# Patient Record
Sex: Male | Born: 1960 | Race: White | Hispanic: No | Marital: Single | Smoking: Current every day smoker
Health system: Southern US, Community
[De-identification: ages and names within clinical notes are randomized; demographics above are authoritative.]

## PROBLEM LIST (undated history)

## (undated) DIAGNOSIS — F1111 Opioid abuse, in remission: Secondary | ICD-10-CM

## (undated) DIAGNOSIS — G8929 Other chronic pain: Secondary | ICD-10-CM

## (undated) DIAGNOSIS — F1010 Alcohol abuse, uncomplicated: Secondary | ICD-10-CM

## (undated) DIAGNOSIS — B1920 Unspecified viral hepatitis C without hepatic coma: Secondary | ICD-10-CM

## (undated) DIAGNOSIS — I219 Acute myocardial infarction, unspecified: Secondary | ICD-10-CM

## (undated) DIAGNOSIS — A159 Respiratory tuberculosis unspecified: Secondary | ICD-10-CM

## (undated) DIAGNOSIS — F419 Anxiety disorder, unspecified: Secondary | ICD-10-CM

## (undated) HISTORY — PX: CORONARY STENT PLACEMENT: SHX1402

---

## 2006-02-14 ENCOUNTER — Emergency Department: Payer: Self-pay | Admitting: Emergency Medicine

## 2006-02-24 ENCOUNTER — Emergency Department: Payer: Self-pay | Admitting: Emergency Medicine

## 2006-02-25 ENCOUNTER — Emergency Department: Payer: Self-pay | Admitting: Emergency Medicine

## 2006-03-01 ENCOUNTER — Emergency Department: Payer: Self-pay | Admitting: Emergency Medicine

## 2006-03-13 ENCOUNTER — Emergency Department: Payer: Self-pay | Admitting: Emergency Medicine

## 2009-03-02 ENCOUNTER — Emergency Department: Payer: Self-pay | Admitting: Emergency Medicine

## 2009-07-21 ENCOUNTER — Emergency Department: Payer: Self-pay | Admitting: Emergency Medicine

## 2010-07-12 ENCOUNTER — Emergency Department (HOSPITAL_COMMUNITY): Admission: EM | Admit: 2010-07-12 | Discharge: 2010-07-13 | Payer: Self-pay | Admitting: Emergency Medicine

## 2010-10-15 ENCOUNTER — Other Ambulatory Visit: Payer: Self-pay | Admitting: Family Medicine

## 2011-03-12 LAB — COMPREHENSIVE METABOLIC PANEL
ALT: 93 U/L — ABNORMAL HIGH (ref 0–53)
BUN: 4 mg/dL — ABNORMAL LOW (ref 6–23)
CO2: 24 mEq/L (ref 19–32)
Calcium: 8.3 mg/dL — ABNORMAL LOW (ref 8.4–10.5)
Creatinine, Ser: 1.01 mg/dL (ref 0.4–1.5)
GFR calc non Af Amer: >60 mL/min (ref >60)
Glucose, Bld: 169 mg/dL — ABNORMAL HIGH (ref 70–99)
Sodium: 141 mEq/L (ref 135–145)
Total Protein: 6.8 g/dL (ref 6.0–8.3)

## 2011-03-12 LAB — CBC
HCT: 43.5 % (ref 39.0–52.0)
Hemoglobin: 15.4 g/dL (ref 13.0–17.0)
MCH: 33.8 pg (ref 26.0–34.0)
MCHC: 35.4 g/dL (ref 30.0–36.0)
MCV: 95.5 fL (ref 78.0–100.0)
RDW: 14.3 % (ref 11.5–15.5)

## 2011-03-12 LAB — DIFFERENTIAL
Eosinophils Absolute: 0.1 10*3/uL (ref 0.0–0.7)
Lymphs Abs: 2.1 10*3/uL (ref 0.7–4.0)
Monocytes Relative: 11 % (ref 3–12)
Neutro Abs: 2.8 10*3/uL (ref 1.7–7.7)
Neutrophils Relative %: 49 % (ref 43–77)

## 2011-03-12 LAB — URINALYSIS, ROUTINE W REFLEX MICROSCOPIC
Glucose, UA: 250 mg/dL — AB
Ketones, ur: NEGATIVE mg/dL
Nitrite: NEGATIVE
Specific Gravity, Urine: 1.012 (ref 1.005–1.030)
pH: 5.5 (ref 5.0–8.0)

## 2011-03-12 LAB — RAPID URINE DRUG SCREEN, HOSP PERFORMED
Benzodiazepines: NOT DETECTED
Cocaine: POSITIVE — AB
Opiates: NOT DETECTED

## 2011-04-30 ENCOUNTER — Emergency Department: Payer: Self-pay | Admitting: Emergency Medicine

## 2012-12-13 ENCOUNTER — Emergency Department: Payer: Self-pay | Admitting: Internal Medicine

## 2012-12-13 LAB — COMPREHENSIVE METABOLIC PANEL
Anion Gap: 7 (ref 7–16)
BUN: 7 mg/dL (ref 7–18)
Bilirubin,Total: 0.4 mg/dL (ref 0.2–1.0)
Chloride: 105 mmol/L (ref 98–107)
Co2: 25 mmol/L (ref 21–32)
Creatinine: 0.94 mg/dL (ref 0.60–1.30)
EGFR (African American): >60
Glucose: 80 mg/dL (ref 65–99)
Osmolality: 271 (ref 275–301)
Potassium: 4.1 mmol/L (ref 3.5–5.1)
SGOT(AST): 45 U/L — ABNORMAL HIGH (ref 15–37)
SGPT (ALT): 53 U/L (ref 12–78)
Total Protein: 8.3 g/dL — ABNORMAL HIGH (ref 6.4–8.2)

## 2012-12-13 LAB — DRUG SCREEN, URINE
Amphetamines, Ur Screen: NEGATIVE (ref ?–1000)
Benzodiazepine, Ur Scrn: POSITIVE (ref ?–200)
Cocaine Metabolite,Ur ~~LOC~~: NEGATIVE (ref ?–300)
MDMA (Ecstasy)Ur Screen: NEGATIVE (ref ?–500)
Methadone, Ur Screen: NEGATIVE (ref ?–300)

## 2012-12-13 LAB — CBC
MCHC: 34 g/dL (ref 32.0–36.0)
MCV: 94 fL (ref 80–100)
Platelet: 302 10*3/uL (ref 150–440)
RBC: 5.42 10*6/uL (ref 4.40–5.90)
RDW: 13.8 % (ref 11.5–14.5)
WBC: 9.5 10*3/uL (ref 3.8–10.6)

## 2012-12-13 LAB — SALICYLATE LEVEL: Salicylates, Serum: 2.9 mg/dL — ABNORMAL HIGH

## 2012-12-13 LAB — TSH: Thyroid Stimulating Horm: 1.27 u[IU]/mL

## 2012-12-13 LAB — ACETAMINOPHEN LEVEL: Acetaminophen: <2 ug/mL

## 2012-12-16 ENCOUNTER — Emergency Department: Payer: Self-pay | Admitting: Emergency Medicine

## 2012-12-16 LAB — URINALYSIS, COMPLETE
Bacteria: NONE SEEN
Bilirubin,UR: NEGATIVE
Glucose,UR: NEGATIVE mg/dL (ref 0–75)
Leukocyte Esterase: NEGATIVE
Nitrite: NEGATIVE
Squamous Epithelial: <1
WBC UR: <1 /HPF (ref 0–5)

## 2012-12-16 LAB — CBC
HCT: 48.2 % (ref 40.0–52.0)
HGB: 16.3 g/dL (ref 13.0–18.0)
MCHC: 33.8 g/dL (ref 32.0–36.0)
MCV: 94 fL (ref 80–100)
Platelet: 290 10*3/uL (ref 150–440)

## 2012-12-16 LAB — COMPREHENSIVE METABOLIC PANEL
Albumin: 4 g/dL (ref 3.4–5.0)
BUN: 5 mg/dL — ABNORMAL LOW (ref 7–18)
Bilirubin,Total: 0.3 mg/dL (ref 0.2–1.0)
Calcium, Total: 8.5 mg/dL (ref 8.5–10.1)
Chloride: 108 mmol/L — ABNORMAL HIGH (ref 98–107)
Creatinine: 1.21 mg/dL (ref 0.60–1.30)
EGFR (African American): >60
Glucose: 100 mg/dL — ABNORMAL HIGH (ref 65–99)
Potassium: 4 mmol/L (ref 3.5–5.1)
SGOT(AST): 53 U/L — ABNORMAL HIGH (ref 15–37)
SGPT (ALT): 52 U/L (ref 12–78)
Sodium: 142 mmol/L (ref 136–145)
Total Protein: 7.8 g/dL (ref 6.4–8.2)

## 2012-12-16 LAB — DRUG SCREEN, URINE
Amphetamines, Ur Screen: NEGATIVE (ref ?–1000)
Cocaine Metabolite,Ur ~~LOC~~: NEGATIVE (ref ?–300)
Methadone, Ur Screen: NEGATIVE (ref ?–300)
Opiate, Ur Screen: NEGATIVE (ref ?–300)
Phencyclidine (PCP) Ur S: NEGATIVE (ref ?–25)
Tricyclic, Ur Screen: NEGATIVE (ref ?–1000)

## 2013-11-30 ENCOUNTER — Encounter (HOSPITAL_COMMUNITY): Payer: Self-pay | Admitting: Emergency Medicine

## 2013-11-30 ENCOUNTER — Observation Stay (HOSPITAL_COMMUNITY)
Admission: EM | Admit: 2013-11-30 | Discharge: 2013-12-02 | Disposition: A | Discharge status: Home / Self Care | Payer: Medicaid - Out of State | Attending: Family Medicine | Admitting: Family Medicine

## 2013-11-30 ENCOUNTER — Emergency Department (HOSPITAL_COMMUNITY): Payer: Medicaid - Out of State

## 2013-11-30 ENCOUNTER — Other Ambulatory Visit: Payer: Self-pay

## 2013-11-30 DIAGNOSIS — R0602 Shortness of breath: Secondary | ICD-10-CM | POA: Insufficient documentation

## 2013-11-30 DIAGNOSIS — E781 Pure hyperglyceridemia: Secondary | ICD-10-CM | POA: Diagnosis present

## 2013-11-30 DIAGNOSIS — R072 Precordial pain: Principal | ICD-10-CM | POA: Insufficient documentation

## 2013-11-30 DIAGNOSIS — R768 Other specified abnormal immunological findings in serum: Secondary | ICD-10-CM | POA: Diagnosis present

## 2013-11-30 DIAGNOSIS — F1010 Alcohol abuse, uncomplicated: Secondary | ICD-10-CM

## 2013-11-30 DIAGNOSIS — Z9861 Coronary angioplasty status: Secondary | ICD-10-CM | POA: Diagnosis not present

## 2013-11-30 DIAGNOSIS — Z7902 Long term (current) use of antithrombotics/antiplatelets: Secondary | ICD-10-CM | POA: Diagnosis not present

## 2013-11-30 DIAGNOSIS — I2510 Atherosclerotic heart disease of native coronary artery without angina pectoris: Secondary | ICD-10-CM | POA: Insufficient documentation

## 2013-11-30 DIAGNOSIS — Z7982 Long term (current) use of aspirin: Secondary | ICD-10-CM | POA: Insufficient documentation

## 2013-11-30 DIAGNOSIS — Z8611 Personal history of tuberculosis: Secondary | ICD-10-CM | POA: Diagnosis not present

## 2013-11-30 DIAGNOSIS — R110 Nausea: Secondary | ICD-10-CM | POA: Diagnosis not present

## 2013-11-30 DIAGNOSIS — R079 Chest pain, unspecified: Secondary | ICD-10-CM | POA: Diagnosis present

## 2013-11-30 DIAGNOSIS — I252 Old myocardial infarction: Secondary | ICD-10-CM | POA: Insufficient documentation

## 2013-11-30 DIAGNOSIS — IMO0002 Reserved for concepts with insufficient information to code with codable children: Secondary | ICD-10-CM | POA: Insufficient documentation

## 2013-11-30 DIAGNOSIS — R7401 Elevation of levels of liver transaminase levels: Secondary | ICD-10-CM | POA: Diagnosis present

## 2013-11-30 DIAGNOSIS — Z8619 Personal history of other infectious and parasitic diseases: Secondary | ICD-10-CM | POA: Insufficient documentation

## 2013-11-30 DIAGNOSIS — F17200 Nicotine dependence, unspecified, uncomplicated: Secondary | ICD-10-CM | POA: Diagnosis not present

## 2013-11-30 DIAGNOSIS — F411 Generalized anxiety disorder: Secondary | ICD-10-CM | POA: Diagnosis not present

## 2013-11-30 DIAGNOSIS — K219 Gastro-esophageal reflux disease without esophagitis: Secondary | ICD-10-CM

## 2013-11-30 DIAGNOSIS — N182 Chronic kidney disease, stage 2 (mild): Secondary | ICD-10-CM

## 2013-11-30 HISTORY — DX: Acute myocardial infarction, unspecified: I21.9

## 2013-11-30 HISTORY — DX: Anxiety disorder, unspecified: F41.9

## 2013-11-30 HISTORY — DX: Respiratory tuberculosis unspecified: A15.9

## 2013-11-30 HISTORY — DX: Unspecified viral hepatitis C without hepatic coma: B19.20

## 2013-11-30 LAB — COMPREHENSIVE METABOLIC PANEL
ALT: 98 U/L — ABNORMAL HIGH (ref 0–53)
AST: 61 U/L — ABNORMAL HIGH (ref 0–37)
CO2: 24 mEq/L (ref 19–32)
Chloride: 102 mEq/L (ref 96–112)
Creatinine, Ser: 0.83 mg/dL (ref 0.50–1.35)
GFR calc Af Amer: >90 mL/min (ref >90)
GFR calc non Af Amer: >90 mL/min (ref >90)
Glucose, Bld: 108 mg/dL — ABNORMAL HIGH (ref 70–99)
Sodium: 141 mEq/L (ref 135–145)
Total Bilirubin: 0.3 mg/dL (ref 0.3–1.2)

## 2013-11-30 LAB — POCT I-STAT TROPONIN I: Troponin i, poc: 0.01 ng/mL (ref 0.00–0.08)

## 2013-11-30 LAB — URINALYSIS, ROUTINE W REFLEX MICROSCOPIC
Bilirubin Urine: NEGATIVE
Hgb urine dipstick: NEGATIVE
Protein, ur: NEGATIVE mg/dL
Urobilinogen, UA: 0.2 mg/dL (ref 0.0–1.0)

## 2013-11-30 LAB — CBC
Hemoglobin: 18.6 g/dL — ABNORMAL HIGH (ref 13.0–17.0)
MCV: 89.2 fL (ref 78.0–100.0)
Platelets: 220 10*3/uL (ref 150–400)
RBC: 5.66 MIL/uL (ref 4.22–5.81)
WBC: 7.7 10*3/uL (ref 4.0–10.5)

## 2013-11-30 LAB — RAPID URINE DRUG SCREEN, HOSP PERFORMED
Barbiturates: NOT DETECTED
Tetrahydrocannabinol: NOT DETECTED

## 2013-11-30 LAB — PRO B NATRIURETIC PEPTIDE: Pro B Natriuretic peptide (BNP): 459 pg/mL — ABNORMAL HIGH (ref 0–125)

## 2013-11-30 LAB — TROPONIN I: Troponin I: <0.3 ng/mL (ref <0.30)

## 2013-11-30 MED ORDER — NITROGLYCERIN 0.4 MG SL SUBL: 0.4000 mg | SUBLINGUAL_TABLET | SUBLINGUAL | Status: DC | PRN

## 2013-11-30 MED ORDER — ASPIRIN EC 81 MG PO TBEC
81.0000 mg | DELAYED_RELEASE_TABLET | Freq: Every day | ORAL | Status: DC
  Administered 2013-12-01 – 2013-12-02 (×2): 81 mg via ORAL
  Filled 2013-11-30 (×3): qty 1

## 2013-11-30 MED ORDER — CLOPIDOGREL BISULFATE 75 MG PO TABS
75.0000 mg | ORAL_TABLET | Freq: Every day | ORAL | Status: DC
  Administered 2013-12-01 – 2013-12-02 (×2): 75 mg via ORAL
  Filled 2013-11-30 (×2): qty 1

## 2013-11-30 MED ORDER — SODIUM CHLORIDE 0.9 % IV SOLN: 250.0000 mL | INTRAVENOUS | Status: DC | PRN

## 2013-11-30 MED ORDER — ASPIRIN 300 MG RE SUPP
300.0000 mg | RECTAL | Status: AC
  Filled 2013-11-30: qty 1

## 2013-11-30 MED ORDER — ASPIRIN 81 MG PO CHEW: 324.0000 mg | CHEWABLE_TABLET | ORAL | Status: AC

## 2013-11-30 MED ORDER — ADULT MULTIVITAMIN W/MINERALS CH
1.0000 | ORAL_TABLET | Freq: Every day | ORAL | Status: DC
  Administered 2013-12-01 – 2013-12-02 (×2): 1 via ORAL
  Filled 2013-11-30 (×2): qty 1

## 2013-11-30 MED ORDER — PANTOPRAZOLE SODIUM 40 MG PO TBEC
40.0000 mg | DELAYED_RELEASE_TABLET | Freq: Every day | ORAL | Status: DC
  Administered 2013-11-30 – 2013-12-02 (×3): 40 mg via ORAL
  Filled 2013-11-30 (×3): qty 1

## 2013-11-30 MED ORDER — VITAMIN B-1 100 MG PO TABS
100.0000 mg | ORAL_TABLET | Freq: Every day | ORAL | Status: DC
  Administered 2013-12-01 – 2013-12-02 (×2): 100 mg via ORAL
  Filled 2013-11-30 (×2): qty 1

## 2013-11-30 MED ORDER — LORAZEPAM 2 MG/ML IJ SOLN
0.0000 mg | Freq: Four times a day (QID) | INTRAMUSCULAR | Status: DC
  Administered 2013-12-01: 1 mg via INTRAVENOUS
  Administered 2013-12-02: 2 mg via INTRAVENOUS
  Filled 2013-11-30 (×2): qty 1

## 2013-11-30 MED ORDER — KETOROLAC TROMETHAMINE 30 MG/ML IJ SOLN
30.0000 mg | Freq: Once | INTRAMUSCULAR | Status: AC
  Administered 2013-11-30: 30 mg via INTRAVENOUS
  Filled 2013-11-30: qty 1

## 2013-11-30 MED ORDER — SODIUM CHLORIDE 0.9 % IJ SOLN: 3.0000 mL | INTRAMUSCULAR | Status: DC | PRN

## 2013-11-30 MED ORDER — FOLIC ACID 1 MG PO TABS
1.0000 mg | ORAL_TABLET | Freq: Every day | ORAL | Status: DC
  Administered 2013-12-01 – 2013-12-02 (×2): 1 mg via ORAL
  Filled 2013-11-30 (×2): qty 1

## 2013-11-30 MED ORDER — THIAMINE HCL 100 MG/ML IJ SOLN
100.0000 mg | Freq: Every day | INTRAMUSCULAR | Status: DC
  Filled 2013-11-30: qty 1

## 2013-11-30 MED ORDER — LORAZEPAM 2 MG/ML IJ SOLN: 1.0000 mg | Freq: Four times a day (QID) | INTRAMUSCULAR | Status: DC | PRN

## 2013-11-30 MED ORDER — ACETAMINOPHEN 325 MG PO TABS
650.0000 mg | ORAL_TABLET | ORAL | Status: DC | PRN
  Administered 2013-12-01: 650 mg via ORAL
  Filled 2013-11-30: qty 2

## 2013-11-30 MED ORDER — LORAZEPAM 1 MG PO TABS
1.0000 mg | ORAL_TABLET | Freq: Four times a day (QID) | ORAL | Status: DC | PRN
  Administered 2013-12-01 (×2): 1 mg via ORAL
  Filled 2013-11-30 (×2): qty 1

## 2013-11-30 MED ORDER — SODIUM CHLORIDE 0.9 % IJ SOLN
3.0000 mL | Freq: Two times a day (BID) | INTRAMUSCULAR | Status: DC
  Administered 2013-11-30 – 2013-12-02 (×3): 3 mL via INTRAVENOUS

## 2013-11-30 MED ORDER — LORAZEPAM 2 MG/ML IJ SOLN: 0.0000 mg | Freq: Two times a day (BID) | INTRAMUSCULAR | Status: DC

## 2013-11-30 MED ORDER — ONDANSETRON HCL 4 MG/2ML IJ SOLN: 4.0000 mg | Freq: Four times a day (QID) | INTRAMUSCULAR | Status: DC | PRN

## 2013-11-30 MED ORDER — HEPARIN SODIUM (PORCINE) 5000 UNIT/ML IJ SOLN
5000.0000 [IU] | Freq: Three times a day (TID) | INTRAMUSCULAR | Status: DC
  Administered 2013-11-30 – 2013-12-01 (×4): 5000 [IU] via SUBCUTANEOUS
  Filled 2013-11-30 (×8): qty 1

## 2013-11-30 NOTE — ED Notes (Signed)
[facility] presents with a 52 yo male [place] with CP.  Pt apperaed to be in respiratory distress/ pt couldn't speak in complete sentences when given NTG and relieved pain and breathing difficulty completely.  12 lead unremarkable; ST at 104/ no elevations.  Hx of Hep C and TB (treated/last CXR clear).

## 2013-11-30 NOTE — ED Provider Notes (Signed)
I saw and evaluated the patient, reviewed the resident's note and I agree with the findings and plan.  EKG Interpretation    Date/Time:  Saturday November 30 2013 15:00:57 EST Ventricular Rate:  112 PR Interval:  139 QRS Duration: 92 QT Interval:  342 QTC Calculation: 467 R Axis:   -9 Text Interpretation:  Sinus tachycardia Probable left atrial enlargement Inferior infarct, age indeterminate No old tracing to compare Confirmed by Thaddeus Evitts  MD-J, Chiann Goffredo ([id number]) on 11/30/2013 3:13:50 PM            Pt presents with complaints of left sided chest pain.  PT has history of CAD with stent pplacement in [place].  He has been drinking alcohol today and started developing left sided chest pain.  Will check cardiac markers, CXR. Monitor closely.  NO acute STEMI noted on initial ECG.   Initial labs unremarkable however with his history cannot exclude recurrent ACS.  Will admit for further evaluation  Celene Kras, MD 11/30/13 1902

## 2013-11-30 NOTE — ED Notes (Addendum)
Pt had taken himself off the monitor, will not allow me to place the monitor, continuous pulse oximetry or blood pressure cuff back on him; pt stated "I feel good now. I"m a lot better than I was earlier when I got in here. I can go right?"; informed pt that I would ask the nurse then pt asked if he could have something to eat; asked RN and was informed that pt would need to wait until all his results came back; informed pt and pt stated "ok, that's fine. Can I have a blanket?" gave pt a warm blanket; pt resting at this time

## 2013-11-30 NOTE — H&P (Signed)
Family Medicine Teaching [facility] Admission History and Physical Service Pager: [phone]  Patient name: Darrell [name] Medical record number: [id number] Date of birth: 1961/12/26 Age: 52 y.o. Gender: male  Primary Care Provider: No primary provider on file. Consultants: none Code Status: full  Chief Complaint: chest pain  Assessment and Plan: Darrell [name] is a 52 y.o. male presenting with chest pain. PMH is significant for MI s/p stent placement and Hep C.  # Chest Pain: patient with current resolution of chest pain following nitroglycerin. He gives a pretty typical chest pain history with substernal pressure, dyspnea, and relief with nitro. EKG with no signs of ischemia. Troponins negative thus far. Patient with reported stent placement and stress test within the past 2 months. -will admit to tele, attending Dr Gwendolyn Grant -cycle troponins -repeat EKG in the am -risk stratify A1c, lipid panel, TSH -will request records from most recent hospitalizations in [facility] medical [facility] and [address] in [place], [place] -nitro prn for chest pain  -aspirin 324 mg on admission, 81 mg daily afterwards -continue plavix for recent stent placement -will likely need to consult cardiology in the morning to get set up with cardiology in [place]  # Alcohol abuse: patient reports significant drinking today and most days. -CIWA protocol -observe for withdrawal symptoms  # Elevated AST/ALT: consider this is related to Hep C in addition to drinking. -will trend on CMET -will check hep C antibody  # Reflux: will give trial of protonix in the hospital  FEN/GI: heart healthy, protonix Prophylaxis: Heparin Sq  Disposition: admit to tele, discharge pending chest pain rule out  History of Present Illness: Darrell [name] is a 52 y.o. male presenting with an episode of chest pain earlier today. Patient notes recent history of stent placement on 10/18 in [place] and negative stress test in the middle  of November in [place], [place]. Patient states pain today occurred while he was walking downtown and drinking alcohol. Notes the pain started when he was walking up a hill. Noted discomfort (aching and pressure) in the center of his chest. Noted dyspnea as well. No radiation or diaphoresis. Notes he took a SL nitro and this slowly helped the pain go away. Per ED report the pain lasted for about 20 minutes. He has not had pain since that time. He notes smoking one PPD. Drinking alcohol frequently. Previously using cocaine. Additionally complains of reflux symptoms intermittently with burning in epigastrium and lower chest.  In the ED the patient had 2 negative iStat troponins, an EKG without ischemic changes, negative UDS, pro-BNP 459, hgb 18.6, AST 61, ALT 98.  Review Of Systems: Per HPI with the following additions: none Otherwise 12 point review of systems was performed and was unremarkable.  Patient Active Problem List   Diagnosis Date Noted  . Chest pain 11/30/2013   Past Medical History: Past Medical History  Diagnosis Date  . Hepatitis C   . Tuberculosis   . Myocardial infarction   . Anxiety    Past Surgical History: Past Surgical History  Procedure Laterality Date  . Coronary stent placement     Social History: History  Substance Use Topics  . Smoking status: Current Every Day Smoker  . Smokeless tobacco: Never Used  . Alcohol Use: Yes     Comment: pt had pint whiskey, 2 24 oz beers, 1 can of beer, more whiskey today   Additional social history: Patient endorses history of marijuana and cocaine use. Nurse notes it was reported to her that he has  snorted percocet in the past.  Please also refer to relevant sections of EMR.  Family History: History reviewed. No pertinent family history. Allergies and Medications: No Known Allergies No current facility-administered medications on file prior to encounter.   No current outpatient prescriptions on file prior to encounter.     Objective: BP 107/76  Pulse 85  Temp(Src) 98.4 F (36.9 C) (Oral)  Resp 18  Ht 5\' 11"  (1.803 m)  Wt 178 lb 6.4 oz (80.922 kg)  BMI 24.89 kg/m2  SpO2 98% Exam: General: NAD, laying comfortably on bed HEENT: NCAT, MMM, missing posterior molars, PERRL Cardiovascular: rrr, no murmurs appreciated Respiratory: CTAB, no wheezes or crackles noted Abdomen: soft, NT, ND Extremities: no swelling, 2+ DP pulses Skin: no lesions Neuro: alert, no focal deficits  Labs and Imaging:  Results for orders placed during the hospital encounter of 11/30/13 (from the past 24 hour(s))  CBC     Status: Abnormal   Collection Time    11/30/13  3:15 PM      Result Value Range   WBC 7.7  4.0 - 10.5 K/uL   RBC 5.66  4.22 - 5.81 MIL/uL   Hemoglobin 18.6 (*) 13.0 - 17.0 g/dL   HCT 16.1  09.6 - 04.5 %   MCV 89.2  78.0 - 100.0 fL   MCH 32.9  26.0 - 34.0 pg   MCHC 36.8 (*) 30.0 - 36.0 g/dL   RDW 40.9  81.1 - 91.4 %   Platelets 220  150 - 400 K/uL  COMPREHENSIVE METABOLIC PANEL     Status: Abnormal   Collection Time    11/30/13  3:15 PM      Result Value Range   Sodium 141  135 - 145 mEq/L   Potassium 3.9  3.5 - 5.1 mEq/L   Chloride 102  96 - 112 mEq/L   CO2 24  19 - 32 mEq/L   Glucose, Bld 108 (*) 70 - 99 mg/dL   BUN 5 (*) 6 - 23 mg/dL   Creatinine, Ser 7.82  0.50 - 1.35 mg/dL   Calcium 8.9  8.4 - 95.6 mg/dL   Total Protein 8.4 (*) 6.0 - 8.3 g/dL   Albumin 4.5  3.5 - 5.2 g/dL   AST 61 (*) 0 - 37 U/L   ALT 98 (*) 0 - 53 U/L   Alkaline Phosphatase 67  39 - 117 U/L   Total Bilirubin 0.3  0.3 - 1.2 mg/dL   GFR calc non Af Amer >90  >90 mL/min   GFR calc Af Amer >90  >90 mL/min  PRO B NATRIURETIC PEPTIDE     Status: Abnormal   Collection Time    11/30/13  3:34 PM      Result Value Range   Pro B Natriuretic peptide (BNP) 459.0 (*) 0 - 125 pg/mL  POCT I-STAT TROPONIN I     Status: None   Collection Time    11/30/13  3:34 PM      Result Value Range   Troponin i, poc 0.01  0.00 - 0.08 ng/mL    Comment 3           URINALYSIS, ROUTINE W REFLEX MICROSCOPIC     Status: None   Collection Time    11/30/13  5:26 PM      Result Value Range   Color, Urine YELLOW  YELLOW   APPearance CLEAR  CLEAR   Specific Gravity, Urine 1.005  1.005 - 1.030   pH 6.0  5.0 - 8.0   Glucose, UA NEGATIVE  NEGATIVE mg/dL   Hgb urine dipstick NEGATIVE  NEGATIVE   Bilirubin Urine NEGATIVE  NEGATIVE   Ketones, ur NEGATIVE  NEGATIVE mg/dL   Protein, ur NEGATIVE  NEGATIVE mg/dL   Urobilinogen, UA 0.2  0.0 - 1.0 mg/dL   Nitrite NEGATIVE  NEGATIVE   Leukocytes, UA NEGATIVE  NEGATIVE  URINE RAPID DRUG SCREEN (HOSP PERFORMED)     Status: None   Collection Time    11/30/13  5:26 PM      Result Value Range   Opiates NONE DETECTED  NONE DETECTED   Cocaine NONE DETECTED  NONE DETECTED   Benzodiazepines NONE DETECTED  NONE DETECTED   Amphetamines NONE DETECTED  NONE DETECTED   Tetrahydrocannabinol NONE DETECTED  NONE DETECTED   Barbiturates NONE DETECTED  NONE DETECTED  POCT I-STAT TROPONIN I     Status: None   Collection Time    11/30/13  5:50 PM      Result Value Range   Troponin i, poc 0.02  0.00 - 0.08 ng/mL   Comment 3           TROPONIN I     Status: None   Collection Time    11/30/13  9:15 PM      Result Value Range   Troponin I <0.30  <0.30 ng/mL   Dg Chest 2 View  11/30/2013   CLINICAL DATA:  Back pain and shortness of Breath.  EXAM: CHEST  2 VIEW  COMPARISON:  None.  FINDINGS: The cardiac silhouette, mediastinal and hilar contours are within normal limits. There is mild peribronchial thickening in the perihilar regions likely due to smoking. No definite infiltrates, edema or effusions. The bony thorax is intact.  IMPRESSION: Bronchitic type lung changes, likely related to smoking. No infiltrates or effusions.   Electronically Signed   By: Loralie Champagne M.D.   On: 11/30/2013 16:38     Glori Luis, MD 11/30/2013, 10:23 PM PGY-2, [facility] FPTS Intern pager: [phone],  text pages welcome

## 2013-11-30 NOTE — ED Provider Notes (Signed)
CSN: [id number]     Arrival date & time 11/30/13  1453 History   First MD Initiated Contact with Patient 11/30/13 1515     Chief Complaint  Patient presents with  . Chest Pain   (Consider location/radiation/quality/duration/timing/severity/associated sxs/prior Treatment) HPI Comments: 52 year old male past medical history of CAD status [facility] stent placement and hepatitis C comes in chief complaint chest pain. Patient states he was drinking on Street 30 minutes prior to arrival when developed substernal 10 out of 10 chest pain. He has associated shortness breath. Called EMS. Nitroglycerin and ASA given. Complete relief of chest pain with nitroglycerin.  Patient is a 52 y.o. male presenting with chest pain. The history is provided by the patient.  Chest Pain Pain location:  Substernal area Pain quality: pressure   Pain radiates to:  Does not radiate Pain radiates to the back: no   Pain severity:  Severe Onset quality:  Sudden Duration:  20 minutes Timing:  Constant Progression:  Resolved Context comment:  Drinking on street Relieved by:  Nitroglycerin Worsened by:  Nothing tried Associated symptoms: nausea and shortness of breath   Associated symptoms: no cough, no fatigue and no headache     Past Medical History  Diagnosis Date  . Hepatitis C   . Tuberculosis    Past Surgical History  Procedure Laterality Date  . Coronary stent placement     History reviewed. No pertinent family history. History  Substance Use Topics  . Smoking status: Current Every Day Smoker  . Smokeless tobacco: Never Used  . Alcohol Use: Yes     Comment: pt had pint whiskey, 2 24 oz beers, 1 can of beer, more whiskey today    Review of Systems  Constitutional: Negative for fatigue.  Respiratory: Positive for shortness of breath. Negative for cough.   Cardiovascular: Positive for chest pain.  Gastrointestinal: Positive for nausea.  Genitourinary: Negative for dysuria.  Skin: Negative for rash.   Neurological: Negative for headaches.  All other systems reviewed and are negative.    Allergies  Review of patient's allergies indicates no known allergies.  Home Medications   Current Outpatient Rx  Name  Route  Sig  Dispense  Refill  . aspirin EC 81 MG tablet   Oral   Take 81 mg by mouth daily.         . clopidogrel (PLAVIX) 75 MG tablet   Oral   Take 75 mg by mouth daily with breakfast.          BP 112/90  Pulse 91  Temp(Src) 98.2 F (36.8 C) (Oral)  Resp 14  SpO2 98% Physical Exam  Nursing note and vitals reviewed. Constitutional: He is oriented to person, place, and time. He appears well-developed and well-nourished.  HENT:  Head: Normocephalic and atraumatic.  Eyes: EOM are normal. Pupils are equal, round, and reactive to light.  Neck: Normal range of motion.  Cardiovascular: Regular rhythm and intact distal pulses.   Slightly tachycardic (104) but pt very agitated and yelling at nurses and myself on exam. Remainder of cardiac exam difficult as pt yelling  "yeehaw" when attempting to listen to chest despite asking not to do this. When able to auscultate no murmur rubs or gallops appreciated    Pulmonary/Chest: Effort normal and breath sounds normal. No respiratory distress. He has no wheezes. He exhibits no tenderness.  Abdominal: Soft. He exhibits no distension. There is no tenderness. There is no rebound and no guarding.  Musculoskeletal: Normal range of motion. He  exhibits no edema.  Neurological: He is alert and oriented to person, place, and time. No cranial nerve deficit. He exhibits normal muscle tone. Coordination normal.  Skin: Skin is warm and dry. No rash noted.  Psychiatric: His affect is angry. He is agitated. He expresses no homicidal and no suicidal ideation.  Pt yelling at staff and uncooperative with exam despite asking pt to not yell at staff. Pt upset with IV start saying he can hit his veins easier    ED Course  Procedures (including  critical care time) Labs Review Labs Reviewed  CBC - Abnormal; Notable for the following:    Hemoglobin 18.6 (*)    MCHC 36.8 (*)    All other components within normal limits  COMPREHENSIVE METABOLIC PANEL - Abnormal; Notable for the following:    Glucose, Bld 108 (*)    BUN 5 (*)    Total Protein 8.4 (*)    AST 61 (*)    ALT 98 (*)    All other components within normal limits  PRO B NATRIURETIC PEPTIDE - Abnormal; Notable for the following:    Pro B Natriuretic peptide (BNP) 459.0 (*)    All other components within normal limits  URINALYSIS, ROUTINE W REFLEX MICROSCOPIC  URINE RAPID DRUG SCREEN (HOSP PERFORMED)  POCT I-STAT TROPONIN I  POCT I-STAT TROPONIN I   Imaging Review Dg Chest 2 View  11/30/2013   CLINICAL DATA:  Back pain and shortness of Breath.  EXAM: CHEST  2 VIEW  COMPARISON:  None.  FINDINGS: The cardiac silhouette, mediastinal and hilar contours are within normal limits. There is mild peribronchial thickening in the perihilar regions likely due to smoking. No definite infiltrates, edema or effusions. The bony thorax is intact.  IMPRESSION: Bronchitic type lung changes, likely related to smoking. No infiltrates or effusions.   Electronically Signed   By: Loralie Champagne M.D.   On: 11/30/2013 16:38    EKG Interpretation    Date/Time:  Saturday November 30 2013 15:00:57 EST Ventricular Rate:  112 PR Interval:  139 QRS Duration: 92 QT Interval:  342 QTC Calculation: 467 R Axis:   -9 Text Interpretation:  Sinus tachycardia Probable left atrial enlargement Inferior infarct, age indeterminate No old tracing to compare Confirmed by KNAPP  MD-J, JON ([id number]) on 11/30/2013 3:13:50 PM            MDM   1. Chest pain     On arrival afebrile vital signs stable. Patient denies any chest pain on arrival. States nitroglycerin with EMS alleviated all pain. Patient received ASA 325 with EMS. Patient also reported history of stent placement in [place] several years ago.  Concern is patient for cardiac related chest pain given high risk history. EKG findings as noted above however no overt signs of cardiac ischemia. Cardiac enzymes negative x2. Chest x-ray within normal limits. Chest pain not consistent with aortic pathology. Chest x-ray showed no widening mediastinum. Doubt aortic pathology. CBC CMP unremarkable. UDS was also obtained, patient has a history of drug use to rule out cocaine or stimulants related tachycardia and chest pain. UDS showed no positive results. Because patient has had history of CAD and is high-risk will be admitted to family medicine service for continued cardiac evaluation. Remained stable in the ED with no further acute issues or chest pain until time of transfer to floor.  Bridgett Larsson, MD 11/30/13 [phone]

## 2013-12-01 DIAGNOSIS — F17200 Nicotine dependence, unspecified, uncomplicated: Secondary | ICD-10-CM | POA: Diagnosis present

## 2013-12-01 DIAGNOSIS — N182 Chronic kidney disease, stage 2 (mild): Secondary | ICD-10-CM

## 2013-12-01 DIAGNOSIS — F1010 Alcohol abuse, uncomplicated: Secondary | ICD-10-CM

## 2013-12-01 DIAGNOSIS — E781 Pure hyperglyceridemia: Secondary | ICD-10-CM | POA: Diagnosis present

## 2013-12-01 DIAGNOSIS — R7401 Elevation of levels of liver transaminase levels: Secondary | ICD-10-CM | POA: Diagnosis present

## 2013-12-01 LAB — LIPID PANEL
Cholesterol: 138 mg/dL (ref 0–200)
HDL: 26 mg/dL — ABNORMAL LOW (ref >39)
LDL Cholesterol: 64 mg/dL (ref 0–99)
Triglycerides: 239 mg/dL — ABNORMAL HIGH (ref <150)
VLDL: 48 mg/dL — ABNORMAL HIGH (ref 0–40)

## 2013-12-01 LAB — BASIC METABOLIC PANEL
CO2: 26 mEq/L (ref 19–32)
Chloride: 104 mEq/L (ref 96–112)
Creatinine, Ser: 1.1 mg/dL (ref 0.50–1.35)
GFR calc Af Amer: 87 mL/min — ABNORMAL LOW (ref >90)
GFR calc non Af Amer: 75 mL/min — ABNORMAL LOW (ref >90)
Glucose, Bld: 77 mg/dL (ref 70–99)
Potassium: 4 mEq/L (ref 3.5–5.1)
Sodium: 144 mEq/L (ref 135–145)

## 2013-12-01 LAB — CBC
Hemoglobin: 16.9 g/dL (ref 13.0–17.0)
MCH: 32.2 pg (ref 26.0–34.0)
MCV: 90.3 fL (ref 78.0–100.0)
Platelets: 227 10*3/uL (ref 150–400)
RBC: 5.25 MIL/uL (ref 4.22–5.81)
RDW: 13.1 % (ref 11.5–15.5)
WBC: 8.3 10*3/uL (ref 4.0–10.5)

## 2013-12-01 LAB — HEMOGLOBIN A1C
Hgb A1c MFr Bld: 5.6 % (ref <5.7)
Mean Plasma Glucose: 114 mg/dL (ref <117)

## 2013-12-01 LAB — TROPONIN I: Troponin I: <0.3 ng/mL (ref <0.30)

## 2013-12-01 MED ORDER — NICOTINE 21 MG/24HR TD PT24
21.0000 mg | MEDICATED_PATCH | Freq: Every day | TRANSDERMAL | Status: DC
Start: 2013-12-01 — End: 2013-12-02
  Administered 2013-12-01 – 2013-12-02 (×2): 21 mg via TRANSDERMAL
  Filled 2013-12-01 (×2): qty 1

## 2013-12-01 NOTE — Progress Notes (Signed)
Family Medicine Teaching Service Daily Progress Note Intern Pager: [phone]  Patient name: Darrell [name] Medical record number: [id number] Date of birth: 12-01-1961 Age: 52 y.o. Gender: male  Primary Care Provider: No primary provider on file. Consultants: none Code Status: full  Pt Overview and Major Events to Date:   Assessment and Plan: Darrell [name] is a 52 y.o. male presenting with chest pain. PMH is significant for MI s/p stent placement and Hep C.  # Chest Pain: Resolved. EKG with no signs of ischemia. Troponins negative thus far. Patient with reported stent placement and stress test within the past 2 months. Significant h/o CAD, s/p cath.  -cont Tele  - Med records from most recent hospitalizations in [facility] and [address] in [place], [place] have been requested -nitro prn for chest pain  - ASA 81 mg daily  -continue plavix for recent stent placement   # Alcohol abuse: Last ETOH use in the pm on 11/30/13. Anxious about withdrawal on 12/01/13. -CIWA protocol  -observe for withdrawal symptoms   # Elevated AST/ALT: consider this is related to Hep C in addition to drinking.  - hep C antibody pending  # Reflux: will give trial of protonix in the hospital   FEN/GI: heart healthy, protonix  Prophylaxis: Heparin Sq   Disposition: Pending withdrawal and clinical improvment   Subjective: CP resolved today. Feelign anxious which pt associates w/ his ETOH use and withdrawal.   Objective: Temp:  [98.2 F (36.8 C)-98.6 F (37 C)] 98.6 F (37 C) (12/07 0600) Pulse Rate:  [76-115] 89 (12/07 1032) Resp:  [14-23] 16 (12/07 0600) BP: (95-117)/(40-94) 117/88 mmHg (12/07 1032) SpO2:  [88 %-98 %] 98 % (12/07 0600) Weight:  [178 lb 6.4 oz (80.922 kg)] 178 lb 6.4 oz (80.922 kg) (12/06 2100) Physical Exam: General: NAD, WNWD, lying in bed Cardiovascular: RRR no m/r/g Respiratory: CTAB, nml effort Abdomen: non-distended, nonttp Extremities: no cyanosis, 2+  puleses  Laboratory:   Results for orders placed during the hospital encounter of 11/30/13 (from the past 24 hour(s))  CBC     Status: Abnormal   Collection Time    11/30/13  3:15 PM      Result Value Range   WBC 7.7  4.0 - 10.5 K/uL   RBC 5.66  4.22 - 5.81 MIL/uL   Hemoglobin 18.6 (*) 13.0 - 17.0 g/dL   HCT 14.7  82.9 - 56.2 %   MCV 89.2  78.0 - 100.0 fL   MCH 32.9  26.0 - 34.0 pg   MCHC 36.8 (*) 30.0 - 36.0 g/dL   RDW 13.0  86.5 - 78.4 %   Platelets 220  150 - 400 K/uL  COMPREHENSIVE METABOLIC PANEL     Status: Abnormal   Collection Time    11/30/13  3:15 PM      Result Value Range   Sodium 141  135 - 145 mEq/L   Potassium 3.9  3.5 - 5.1 mEq/L   Chloride 102  96 - 112 mEq/L   CO2 24  19 - 32 mEq/L   Glucose, Bld 108 (*) 70 - 99 mg/dL   BUN 5 (*) 6 - 23 mg/dL   Creatinine, Ser 6.96  0.50 - 1.35 mg/dL   Calcium 8.9  8.4 - 29.5 mg/dL   Total Protein 8.4 (*) 6.0 - 8.3 g/dL   Albumin 4.5  3.5 - 5.2 g/dL   AST 61 (*) 0 - 37 U/L   ALT 98 (*) 0 - 53 U/L  Alkaline Phosphatase 67  39 - 117 U/L   Total Bilirubin 0.3  0.3 - 1.2 mg/dL   GFR calc non Af Amer >90  >90 mL/min   GFR calc Af Amer >90  >90 mL/min  PRO B NATRIURETIC PEPTIDE     Status: Abnormal   Collection Time    11/30/13  3:34 PM      Result Value Range   Pro B Natriuretic peptide (BNP) 459.0 (*) 0 - 125 pg/mL  POCT I-STAT TROPONIN I     Status: None   Collection Time    11/30/13  3:34 PM      Result Value Range   Troponin i, poc 0.01  0.00 - 0.08 ng/mL   Comment 3           URINALYSIS, ROUTINE W REFLEX MICROSCOPIC     Status: None   Collection Time    11/30/13  5:26 PM      Result Value Range   Color, Urine YELLOW  YELLOW   APPearance CLEAR  CLEAR   Specific Gravity, Urine 1.005  1.005 - 1.030   pH 6.0  5.0 - 8.0   Glucose, UA NEGATIVE  NEGATIVE mg/dL   Hgb urine dipstick NEGATIVE  NEGATIVE   Bilirubin Urine NEGATIVE  NEGATIVE   Ketones, ur NEGATIVE  NEGATIVE mg/dL   Protein, ur NEGATIVE  NEGATIVE  mg/dL   Urobilinogen, UA 0.2  0.0 - 1.0 mg/dL   Nitrite NEGATIVE  NEGATIVE   Leukocytes, UA NEGATIVE  NEGATIVE  URINE RAPID DRUG SCREEN (HOSP PERFORMED)     Status: None   Collection Time    11/30/13  5:26 PM      Result Value Range   Opiates NONE DETECTED  NONE DETECTED   Cocaine NONE DETECTED  NONE DETECTED   Benzodiazepines NONE DETECTED  NONE DETECTED   Amphetamines NONE DETECTED  NONE DETECTED   Tetrahydrocannabinol NONE DETECTED  NONE DETECTED   Barbiturates NONE DETECTED  NONE DETECTED  POCT I-STAT TROPONIN I     Status: None   Collection Time    11/30/13  5:50 PM      Result Value Range   Troponin i, poc 0.02  0.00 - 0.08 ng/mL   Comment 3           TROPONIN I     Status: None   Collection Time    11/30/13  9:15 PM      Result Value Range   Troponin I <0.30  <0.30 ng/mL  TROPONIN I     Status: None   Collection Time    12/01/13 12:40 AM      Result Value Range   Troponin I <0.30  <0.30 ng/mL  CBC     Status: None   Collection Time    12/01/13 12:40 AM      Result Value Range   WBC 8.3  4.0 - 10.5 K/uL   RBC 5.25  4.22 - 5.81 MIL/uL   Hemoglobin 16.9  13.0 - 17.0 g/dL   HCT 81.1  91.4 - 78.2 %   MCV 90.3  78.0 - 100.0 fL   MCH 32.2  26.0 - 34.0 pg   MCHC 35.7  30.0 - 36.0 g/dL   RDW 95.6  21.3 - 08.6 %   Platelets 227  150 - 400 K/uL  BASIC METABOLIC PANEL     Status: Abnormal   Collection Time    12/01/13 12:40 AM      Result Value  Range   Sodium 144  135 - 145 mEq/L   Potassium 4.0  3.5 - 5.1 mEq/L   Chloride 104  96 - 112 mEq/L   CO2 26  19 - 32 mEq/L   Glucose, Bld 77  70 - 99 mg/dL   BUN 5 (*) 6 - 23 mg/dL   Creatinine, Ser 1.61  0.50 - 1.35 mg/dL   Calcium 8.8  8.4 - 09.6 mg/dL   GFR calc non Af Amer 75 (*) >90 mL/min   GFR calc Af Amer 87 (*) >90 mL/min  LIPID PANEL     Status: Abnormal   Collection Time    12/01/13 12:40 AM      Result Value Range   Cholesterol 138  0 - 200 mg/dL   Triglycerides 045 (*) <150 mg/dL   HDL 26 (*) >40 mg/dL    Total CHOL/HDL Ratio 5.3     VLDL 48 (*) 0 - 40 mg/dL   LDL Cholesterol 64  0 - 99 mg/dL  TROPONIN I     Status: None   Collection Time    12/01/13  8:05 AM      Result Value Range   Troponin I <0.30  <0.30 ng/mL     Ozella Rocks, MD 12/01/2013, 10:37 AM PGY-3, [facility] FPTS Intern pager: [phone], text pages welcome

## 2013-12-01 NOTE — Progress Notes (Signed)
FMTS Attending Daily Note:  Jeff Johan Creveling MD  [phone] pager  Family Practice pager:  [phone] I have seen and examined this patient and have reviewed their chart. I have discussed this patient with the resident. I agree with the resident's findings, assessment and care plan.  Additionally:  See separate admit note for details.   Kate Larock H Presly Steinruck, MD 12/01/2013 12:04 PM    

## 2013-12-01 NOTE — Progress Notes (Signed)
Patient's heart rate goes up to 120-140's while ambulating to the bathroom.  Earnest Conroy RN

## 2013-12-01 NOTE — H&P (Addendum)
FMTS Attending Admission Note: Renold Don MD Personal pager:  [phone] FPTS Service Pager:  [phone]  I  have seen and examined this patient, reviewed their chart. I have discussed this patient with the resident. I agree with the resident's findings, assessment and care plan.  Additionally: - No evidence of ACS currently.  - Evidently patient with what sounds to be NSTEMI and reported stent placement. [place] records as [place].  Does have evidence of prior inferior infarct on EKG - Supposed to be on ASA/Plavix.  Could not get this medications filled due to finances.  Has paper script with him but no actual medications. - Now going through EtOH w/d.  Scheduling ativan and already on CIWA.   - Feel very hesitant about sending out a gentleman with reported recent MI, possible stent with ASA/Plavix who cannot acquire his medications now going through EtOH w/d and this being the weekend. - Consult case management for help with insurance and medications.  Social work for EtOH abuse -- states he has quit before and started again after losing his job in October. - Nicotine patch.  - Agree with cardiology consult to establish care here in [place].    Tobey Grim, MD 12/01/2013 11:56 AM

## 2013-12-01 NOTE — Progress Notes (Signed)
Utilization Review Completed.Darrell [name] T12/06/2013  

## 2013-12-02 DIAGNOSIS — R894 Abnormal immunological findings in specimens from other organs, systems and tissues: Secondary | ICD-10-CM

## 2013-12-02 DIAGNOSIS — F17200 Nicotine dependence, unspecified, uncomplicated: Secondary | ICD-10-CM

## 2013-12-02 DIAGNOSIS — R768 Other specified abnormal immunological findings in serum: Secondary | ICD-10-CM | POA: Diagnosis present

## 2013-12-02 LAB — BASIC METABOLIC PANEL
BUN: 8 mg/dL (ref 6–23)
Calcium: 8.7 mg/dL (ref 8.4–10.5)
Chloride: 103 mEq/L (ref 96–112)
Creatinine, Ser: 0.87 mg/dL (ref 0.50–1.35)
GFR calc Af Amer: >90 mL/min (ref >90)
GFR calc non Af Amer: >90 mL/min (ref >90)
Glucose, Bld: 86 mg/dL (ref 70–99)
Potassium: 3.5 mEq/L (ref 3.5–5.1)

## 2013-12-02 MED ORDER — CLOPIDOGREL BISULFATE 75 MG PO TABS: 75.0000 mg | ORAL_TABLET | Freq: Every day | ORAL | Status: DC

## 2013-12-02 MED ORDER — NITROGLYCERIN 0.4 MG SL SUBL: 0.4000 mg | SUBLINGUAL_TABLET | SUBLINGUAL | Status: DC | PRN

## 2013-12-02 MED ORDER — LORAZEPAM 2 MG PO TABS: 2.0000 mg | ORAL_TABLET | ORAL | Status: DC

## 2013-12-02 MED ORDER — THIAMINE HCL 100 MG PO TABS: 100.0000 mg | ORAL_TABLET | Freq: Every day | ORAL | Status: DC

## 2013-12-02 MED ORDER — FOLIC ACID 1 MG PO TABS: 1.0000 mg | ORAL_TABLET | Freq: Every day | ORAL | Status: DC

## 2013-12-02 NOTE — Progress Notes (Signed)
CSW (Clinical Child psychotherapist) spoke with supervisor about transportation options for pt. CSW relayed this to pt but expressed that CSW would need to just touch base with pt daughter before arranging any transport. Pt provided CSW with daughter [name]'s phone number [phone]. CSW left voicemail for pt daughter. CSW explained we would wait for pt daughter to return call. Pt said that if CSW could please [place] would figure something out from there. CSW provided pt with bus pass along with shelter list incase pt is unable to reach his daughter and get to [place]. Pt thanked CSW. CSW to touch base with pt again if [name] returns phone call before pt discharges.  Darrell [name], LCSWA [phone]

## 2013-12-02 NOTE — Care Management Note (Signed)
    Page 1 of 1   12/02/2013     1:47:57 PM   CARE MANAGEMENT NOTE 12/02/2013  Patient:  Darrell [name], Darrell [name]   Account Number:  [id number]  Date Initiated:  12/02/2013  Documentation initiated by:  GRAVES-BIGELOW,Ahaana Rochette  Subjective/Objective Assessment:   Pt admitted for cp. Plan for d/c today. Pt states he is trying to get to [facility] to daughter's house.     Action/Plan:   CM did provide pt plavix card. CM also relayed to pt that Karin Golden has medication for 15.00 with the vic card. Pt states his daughter probably has card. CM did ask if pt has a PCP. Pt did not. CM called FP and they would make f/u apt.   Anticipated DC Date:  12/02/2013   Anticipated DC Plan:  HOME/SELF CARE      DC Planning Services  CM consult      Choice offered to / List presented to:             Status of service:  Completed, signed off Medicare Important Message given?   (If response is "NO", the following Medicare IM given date fields will be blank) Date Medicare IM given:   Date Additional Medicare IM given:    Discharge Disposition:  HOME/SELF CARE  Per UR Regulation:  Reviewed for med. necessity/level of care/duration of stay  If discussed at Long Length of Stay Meetings, dates discussed:    Comments:

## 2013-12-02 NOTE — Progress Notes (Signed)
Clinical Social Work Department BRIEF PSYCHOSOCIAL ASSESSMENT 12/02/2013  Patient:  Darrell [name], Darrell [name]     Account Number:  [id number]     Admit date:  11/30/2013  Clinical Social Worker:  Harless Nakayama  Date/Time:  12/02/2013 12:45 PM  Referred by:  Physician  Date Referred:  12/02/2013 Referred for  Substance Abuse  Homelessness  Transportation assistance   Other Referral:   Interview type:  Patient Other interview type:    PSYCHOSOCIAL DATA Living Status:  FAMILY Admitted from facility:   Level of care:   Primary support name:   Primary support relationship to patient:   Degree of support available:   Pt has limited support system. Pt is going to be staying with his daughter at dc.    CURRENT CONCERNS Current Concerns  Other - See comment   Other Concerns:    SOCIAL WORK ASSESSMENT / PLAN CSW received consult for homless isssues as well as substance abuse. CSW informed by pt nurse that pt reports not being homeless and that pt will need transportation assistance. CSW spoke with pt who informed CSW he will be going to stay with his daughter in [place]. CSW asked if pt had any concerns with his living situation pt denies any concerns. CSW spoke with pt about susbtance abuse. Pt reports being sober for "years now" and attending AA meetings but having a relapse when he was traveling. Pt reports to CSW that he is already aware of resources and AA meeting locations in [place] as he as attending them before. Pt denies wanting any further resources. CSW inquired as to how pt was going to get to [place]. Pt stated he was unsure as his daughter has small children at home and cannot pick him up. Pt states there are no other supports available. Pt informed CSW that he has no funds available and "was actually almost arrested the other day for asking people of rmoney". Pt is aware of the bus route and is comfortable using that form of transportation to get to [place] where his  daughter will be able to pick him up. CSW to speak with supervisor to approve giving pt funds to take [employer] bus.   Assessment/plan status:  Psychosocial Support/Ongoing Assessment of Needs Other assessment/ plan:   Information/referral to community resources:   Homeless and Substance Abuse resources denied.    PATIENT'S/FAMILY'S RESPONSE TO PLAN OF CARE: Pt is happy with plan to dc to his daugter's if he can find transportation there.       Obadiah Dennard, LCSWA [phone]

## 2013-12-04 NOTE — Discharge Summary (Signed)
Family Medicine Teaching Service  Discharge Note : Attending Jeff Subhan Hoopes MD Pager [phone] Inpatient Team Pager:  [phone]  I have reviewed this patient and the patient's chart and have discussed discharge planning with the resident at the time of discharge. I agree with the discharge plan as above.    

## 2013-12-04 NOTE — Discharge Summary (Signed)
Family Medicine Teaching [facility] Discharge Summary  Patient name: Darrell [name] Medical record number: [id number] Date of birth: 11-30-1961 Age: 52 y.o. Gender: male Date of Admission: 11/30/2013  Date of Discharge: 12/02/13 Admitting Physician: Tobey Grim, MD  Primary Care Provider: No primary provider on file. Consultants: Cardiology  Indication for Hospitalization: Chest pain, rule out MI  Discharge Diagnoses/Problem List:  Chest Pain - Ruled out MI Alcohol Abuse Transaminitis, Chronic  Disposition: Home  Discharge Condition: Stable  Discharge Exam: General: sitting up in bed comfortably, conversational, NAD  HEENT: NCAT, PERRL, EOMI, MMM Cardiovascular: RRR, no murmurs appreciated, brisk cap refill Respiratory: CTAB. Normal work of breathing Abdomen: soft, NTND, +active BS  Extremities: non-tender, no edema, +2 peripheral pulses  Skin: warm, dry, no rash Neuro: awake, alert, no focal deficits  Brief Hospital Course: [name] is a 52 y.o. male presenting with chest pain. PMH is significant for CAD with MI s/p stent placement (09/2013) and Hep C.  # Chest Pain - Ruled out MI Recent significant h/o MI with stent placement in 09/2013 while in [facility]), started on Plavix at that time, and then presented again with CP 10/2013 in [place] ([facility] [place]). Of note, patient is currently homeless, but plans to remain in [place]. He presented with typical anginal pain described as chest aching / pressure associated with dyspnea, while walking up a hill. Noted he was drinking alcohol at the time. Relief from Nitro SL x 1, arrived in ED and pain had resolved within 20 min. Initial work-up EKG x2 (negative for ischemia), troponins (negative x 3), remained on telemetry w/o acute events, no recurrent CP. Risk stratify labs (HgbA1c 5.6, TSH 1.137, Lipid panel with elevated TG 239, low HDL 26). Resumed ASA and Plavix therapy with stent.  Arranged outpatient follow-up with Cardiology, requested out of state hospital records.   # Alcohol abuse Reports chronic h/o alcohol abuse, however has made effort to decrease within past year. Currently reports drinking 4-8 beers daily. Last ETOH use in the pm on 11/30/13. Anxious about withdrawal on 12/01/13. Placed on CIWA protocol and tolerated Ativan taper well without signs of withdrawal. Recommended prolonged taper, and provided a small number of Ativan 2mg  for patient to complete taper at home.   # Transaminitis Elevated AST (61) and ALT (98), noted to be less than previous h/o of elevated LFTs in 2011. Suspected related to EtOH abuse and chronic Hep C, and repeat HCV Ab (reactive). Additionally tested for HIV (non-reactive).  Issues for Follow Up:  1. PCP - Discussed PCP options and provided patient with follow-up contacts. If unable to locate PCP in [facility] around [place]. Advised to contact [facility] for further assistance. 2. Cardiology Follow-up - Outpatient Cardiology arranged for 12/31. Requested outside state hospital records. Currently pending receipt. Advised that this would be scanned in once received. 3. Alcohol Abuse - Patient seems interested in medical detox, discussed potential resources available. Need to continue to follow-up  Significant Procedures: None  Significant Labs and Imaging:   Recent Labs Lab 11/30/13 1515 12/01/13 0040  WBC 7.7 8.3  HGB 18.6* 16.9  HCT 50.5 47.4  PLT 220 227    Recent Labs Lab 11/30/13 1515 12/01/13 0040 12/02/13 0510  NA 141 144 137  K 3.9 4.0 3.5  CL 102 104 103  CO2 24 26 24   GLUCOSE 108* 77 86  BUN 5* 5* 8  CREATININE 0.83 1.10 0.87  CALCIUM 8.9 8.8 8.7  ALKPHOS 67  --   --  AST 61*  --   --   ALT 98*  --   --   ALBUMIN 4.5  --   --     Recent Results (from the past 2160 hour(s))  CBC     Status: Abnormal   Collection Time    11/30/13  3:15 PM      Result Value Range   WBC 7.7  4.0 - 10.5 K/uL   RBC  5.66  4.22 - 5.81 MIL/uL   Hemoglobin 18.6 (*) 13.0 - 17.0 g/dL   HCT 09.8  11.9 - 14.7 %   MCV 89.2  78.0 - 100.0 fL   MCH 32.9  26.0 - 34.0 pg   MCHC 36.8 (*) 30.0 - 36.0 g/dL   RDW 82.9  56.2 - 13.0 %   Platelets 220  150 - 400 K/uL  COMPREHENSIVE METABOLIC PANEL     Status: Abnormal   Collection Time    11/30/13  3:15 PM      Result Value Range   Sodium 141  135 - 145 mEq/L   Potassium 3.9  3.5 - 5.1 mEq/L   Chloride 102  96 - 112 mEq/L   CO2 24  19 - 32 mEq/L   Glucose, Bld 108 (*) 70 - 99 mg/dL   BUN 5 (*) 6 - 23 mg/dL   Creatinine, Ser 8.65  0.50 - 1.35 mg/dL   Calcium 8.9  8.4 - 78.4 mg/dL   Total Protein 8.4 (*) 6.0 - 8.3 g/dL   Albumin 4.5  3.5 - 5.2 g/dL   AST 61 (*) 0 - 37 U/L   ALT 98 (*) 0 - 53 U/L   Alkaline Phosphatase 67  39 - 117 U/L   Total Bilirubin 0.3  0.3 - 1.2 mg/dL   GFR calc non Af Amer >90  >90 mL/min   GFR calc Af Amer >90  >90 mL/min   Comment: (NOTE)     The eGFR has been calculated using the CKD EPI equation.     This calculation has not been validated in all clinical situations.     eGFR's persistently <90 mL/min signify possible Chronic Kidney     Disease.  PRO B NATRIURETIC PEPTIDE     Status: Abnormal   Collection Time    11/30/13  3:34 PM      Result Value Range   Pro B Natriuretic peptide (BNP) 459.0 (*) 0 - 125 pg/mL  POCT I-STAT TROPONIN I     Status: None   Collection Time    11/30/13  3:34 PM      Result Value Range   Troponin i, poc 0.01  0.00 - 0.08 ng/mL   Comment 3            Comment: Due to the release kinetics of cTnI,     a negative result within the first hours     of the onset of symptoms does not rule out     myocardial infarction with certainty.     If myocardial infarction is still suspected,     repeat the test at appropriate intervals.  URINALYSIS, ROUTINE W REFLEX MICROSCOPIC     Status: None   Collection Time    11/30/13  5:26 PM      Result Value Range   Color, Urine YELLOW  YELLOW   APPearance CLEAR   CLEAR   Specific Gravity, Urine 1.005  1.005 - 1.030   pH 6.0  5.0 - 8.0   Glucose,  UA NEGATIVE  NEGATIVE mg/dL   Hgb urine dipstick NEGATIVE  NEGATIVE   Bilirubin Urine NEGATIVE  NEGATIVE   Ketones, ur NEGATIVE  NEGATIVE mg/dL   Protein, ur NEGATIVE  NEGATIVE mg/dL   Urobilinogen, UA 0.2  0.0 - 1.0 mg/dL   Nitrite NEGATIVE  NEGATIVE   Leukocytes, UA NEGATIVE  NEGATIVE   Comment: MICROSCOPIC NOT DONE ON URINES WITH NEGATIVE PROTEIN, BLOOD, LEUKOCYTES, NITRITE, OR GLUCOSE <1000 mg/dL.  URINE RAPID DRUG SCREEN (HOSP PERFORMED)     Status: None   Collection Time    11/30/13  5:26 PM      Result Value Range   Opiates NONE DETECTED  NONE DETECTED   Cocaine NONE DETECTED  NONE DETECTED   Benzodiazepines NONE DETECTED  NONE DETECTED   Amphetamines NONE DETECTED  NONE DETECTED   Tetrahydrocannabinol NONE DETECTED  NONE DETECTED   Barbiturates NONE DETECTED  NONE DETECTED   Comment:            DRUG SCREEN FOR MEDICAL PURPOSES     ONLY.  IF CONFIRMATION IS NEEDED     FOR ANY PURPOSE, NOTIFY LAB     WITHIN 5 DAYS.                LOWEST DETECTABLE LIMITS     FOR URINE DRUG SCREEN     Drug Class       Cutoff (ng/mL)     Amphetamine      1000     Barbiturate      200     Benzodiazepine   200     Tricyclics       300     Opiates          300     Cocaine          300     THC              50  POCT I-STAT TROPONIN I     Status: None   Collection Time    11/30/13  5:50 PM      Result Value Range   Troponin i, poc 0.02  0.00 - 0.08 ng/mL   Comment 3            Comment: Due to the release kinetics of cTnI,     a negative result within the first hours     of the onset of symptoms does not rule out     myocardial infarction with certainty.     If myocardial infarction is still suspected,     repeat the test at appropriate intervals.  TROPONIN I     Status: None   Collection Time    11/30/13  9:15 PM      Result Value Range   Troponin I <0.30  <0.30 ng/mL   Comment:            Due to  the release kinetics of cTnI,     a negative result within the first hours     of the onset of symptoms does not rule out     myocardial infarction with certainty.     If myocardial infarction is still suspected,     repeat the test at appropriate intervals.  TSH     Status: None   Collection Time    11/30/13  9:15 PM      Result Value Range   TSH 1.137  0.350 - 4.500 uIU/mL   Comment: Performed at  [facility]  HEMOGLOBIN A1C     Status: None   Collection Time    11/30/13  9:15 PM      Result Value Range   Hemoglobin A1C 5.6  <5.7 %   Comment: (NOTE)                                                                               According to the ADA Clinical Practice Recommendations for 2011, when     HbA1c is used as a screening test:      >=6.5%   Diagnostic of Diabetes Mellitus               (if abnormal result is confirmed)     5.7-6.4%   Increased risk of developing Diabetes Mellitus     References:Diagnosis and Classification of Diabetes Mellitus,Diabetes     Care,2011,34(Suppl 1):S62-S69 and Standards of Medical Care in             Diabetes - 2011,Diabetes Care,2011,34 (Suppl 1):S11-S61.   Mean Plasma Glucose 114  <117 mg/dL   Comment: Performed at [facility]  TROPONIN I     Status: None   Collection Time    12/01/13 12:40 AM      Result Value Range   Troponin I <0.30  <0.30 ng/mL   Comment:            Due to the release kinetics of cTnI,     a negative result within the first hours     of the onset of symptoms does not rule out     myocardial infarction with certainty.     If myocardial infarction is still suspected,     repeat the test at appropriate intervals.  CBC     Status: None   Collection Time    12/01/13 12:40 AM      Result Value Range   WBC 8.3  4.0 - 10.5 K/uL   RBC 5.25  4.22 - 5.81 MIL/uL   Hemoglobin 16.9  13.0 - 17.0 g/dL   HCT 40.9  81.1 - 91.4 %   MCV 90.3  78.0 - 100.0 fL   MCH 32.2  26.0 - 34.0 pg   MCHC 35.7  30.0 - 36.0  g/dL   RDW 78.2  95.6 - 21.3 %   Platelets 227  150 - 400 K/uL  BASIC METABOLIC PANEL     Status: Abnormal   Collection Time    12/01/13 12:40 AM      Result Value Range   Sodium 144  135 - 145 mEq/L   Potassium 4.0  3.5 - 5.1 mEq/L   Chloride 104  96 - 112 mEq/L   CO2 26  19 - 32 mEq/L   Glucose, Bld 77  70 - 99 mg/dL   BUN 5 (*) 6 - 23 mg/dL   Creatinine, Ser 0.86  0.50 - 1.35 mg/dL   Calcium 8.8  8.4 - 57.8 mg/dL   GFR calc non Af Amer 75 (*) >90 mL/min   GFR calc Af Amer 87 (*) >90 mL/min   Comment: (NOTE)     The eGFR has been calculated using the CKD EPI equation.  This calculation has not been validated in all clinical situations.     eGFR's persistently <90 mL/min signify possible Chronic Kidney     Disease.  LIPID PANEL     Status: Abnormal   Collection Time    12/01/13 12:40 AM      Result Value Range   Cholesterol 138  0 - 200 mg/dL   Triglycerides 161 (*) <150 mg/dL   HDL 26 (*) >09 mg/dL   Total CHOL/HDL Ratio 5.3     VLDL 48 (*) 0 - 40 mg/dL   LDL Cholesterol 64  0 - 99 mg/dL   Comment:            Total Cholesterol/HDL:CHD Risk     Coronary Heart Disease Risk Table                         Men   Women      1/2 Average Risk   3.4   3.3      Average Risk       5.0   4.4      2 X Average Risk   9.6   7.1      3 X Average Risk  23.4   11.0                Use the calculated Patient Ratio     above and the CHD Risk Table     to determine the patient's CHD Risk.                ATP III CLASSIFICATION (LDL):      <100     mg/dL   Optimal      604-540  mg/dL   Near or Above                        Optimal      130-159  mg/dL   Borderline      981-191  mg/dL   High      >478     mg/dL   Very High  HEPATITIS C ANTIBODY     Status: Abnormal   Collection Time    12/01/13 12:40 AM      Result Value Range   HCV Ab Reactive (*) NEGATIVE   Comment: (NOTE)                                                                               This test is for screening  purposes only.  Reactive results should be     confirmed by an alternative method.  Suggest HCV Qualitative, PCR,     test code 29562.  Specimens will be stable for reflex testing up to 3     days after collection.     Performed at [facility]  TROPONIN I     Status: None   Collection Time    12/01/13  8:05 AM      Result Value Range   Troponin I <0.30  <0.30 ng/mL   Comment:            Due to the release kinetics of cTnI,  a negative result within the first hours     of the onset of symptoms does not rule out     myocardial infarction with certainty.     If myocardial infarction is still suspected,     repeat the test at appropriate intervals.  BASIC METABOLIC PANEL     Status: None   Collection Time    12/02/13  5:10 AM      Result Value Range   Sodium 137  135 - 145 mEq/L   Potassium 3.5  3.5 - 5.1 mEq/L   Chloride 103  96 - 112 mEq/L   CO2 24  19 - 32 mEq/L   Glucose, Bld 86  70 - 99 mg/dL   BUN 8  6 - 23 mg/dL   Creatinine, Ser 1.30  0.50 - 1.35 mg/dL   Calcium 8.7  8.4 - 86.5 mg/dL   GFR calc non Af Amer >90  >90 mL/min   GFR calc Af Amer >90  >90 mL/min   Comment: (NOTE)     The eGFR has been calculated using the CKD EPI equation.     This calculation has not been validated in all clinical situations.     eGFR's persistently <90 mL/min signify possible Chronic Kidney     Disease.  HIV ANTIBODY (ROUTINE TESTING)     Status: None   Collection Time    12/02/13  5:10 AM      Result Value Range   HIV NON REACTIVE  NON REACTIVE   Comment: Performed at [facility]   11/30/13 Chest Xray IMPRESSION:  Bronchitic type lung changes, likely related to smoking. No  infiltrates or effusions  Results/Tests Pending at Time of Discharge: None  Discharge Medications:    Medication List         aspirin EC 81 MG tablet  Take 81 mg by mouth daily.     clopidogrel 75 MG tablet  Commonly known as:  PLAVIX  Take 1 tablet (75 mg total) by mouth daily with  breakfast.     folic acid 1 MG tablet  Commonly known as:  FOLVITE  Take 1 tablet (1 mg total) by mouth daily.     LORazepam 2 MG tablet  Commonly known as:  ATIVAN  Take 1 tablet (2 mg total) by mouth as directed. Take 1 tab every 8 hrs (today 12/8). Take 1 tab every 12 hrs (12/9). Take 1 tab (12/10).     nitroGLYCERIN 0.4 MG SL tablet  Commonly known as:  NITROSTAT  Place 1 tablet (0.4 mg total) under the tongue every 5 (five) minutes as needed for chest pain.     thiamine 100 MG tablet  Take 1 tablet (100 mg total) by mouth daily.        Discharge Instructions: Please refer to Patient Instructions section of EMR for full details.  Patient was counseled important signs and symptoms that should prompt return to medical care, changes in medications, dietary instructions, activity restrictions, and follow up appointments.   Follow-Up Appointments:     Follow-up Information   Schedule an appointment as soon as possible for a visit with [facility].      Follow up with [facility]. (If you are unable to schedule any alternative primary physician in [facility])    Contact information:   [address] [place] [address] [phone]      Follow up with Tobias Alexander, Rexene Edison, MD On 12/25/2013. (  at 10:00am with Dr. Delton See)    Specialty:  Cardiology   Contact information:   [address] [place] [address] [phone]       Saralyn Pilar, DO 12/04/2013, 6:32 AM PGY-1, [facility] Family [facility]

## 2013-12-07 ENCOUNTER — Encounter (HOSPITAL_COMMUNITY): Payer: Self-pay | Admitting: Emergency Medicine

## 2013-12-07 ENCOUNTER — Emergency Department (HOSPITAL_COMMUNITY): Payer: Medicaid - Out of State

## 2013-12-07 ENCOUNTER — Emergency Department (HOSPITAL_COMMUNITY)
Admission: EM | Admit: 2013-12-07 | Discharge: 2013-12-07 | Disposition: A | Discharge status: Home / Self Care | Payer: Medicaid - Out of State | Attending: Emergency Medicine | Admitting: Emergency Medicine

## 2013-12-07 DIAGNOSIS — F10229 Alcohol dependence with intoxication, unspecified: Secondary | ICD-10-CM | POA: Insufficient documentation

## 2013-12-07 DIAGNOSIS — Z79899 Other long term (current) drug therapy: Secondary | ICD-10-CM | POA: Insufficient documentation

## 2013-12-07 DIAGNOSIS — F17200 Nicotine dependence, unspecified, uncomplicated: Secondary | ICD-10-CM | POA: Insufficient documentation

## 2013-12-07 DIAGNOSIS — F1010 Alcohol abuse, uncomplicated: Secondary | ICD-10-CM

## 2013-12-07 DIAGNOSIS — Z91199 Patient's noncompliance with other medical treatment and regimen due to unspecified reason: Secondary | ICD-10-CM | POA: Insufficient documentation

## 2013-12-07 DIAGNOSIS — Z8611 Personal history of tuberculosis: Secondary | ICD-10-CM | POA: Insufficient documentation

## 2013-12-07 DIAGNOSIS — Z7982 Long term (current) use of aspirin: Secondary | ICD-10-CM | POA: Insufficient documentation

## 2013-12-07 DIAGNOSIS — Z7902 Long term (current) use of antithrombotics/antiplatelets: Secondary | ICD-10-CM | POA: Insufficient documentation

## 2013-12-07 DIAGNOSIS — F411 Generalized anxiety disorder: Secondary | ICD-10-CM | POA: Insufficient documentation

## 2013-12-07 DIAGNOSIS — R51 Headache: Secondary | ICD-10-CM | POA: Insufficient documentation

## 2013-12-07 DIAGNOSIS — Z8619 Personal history of other infectious and parasitic diseases: Secondary | ICD-10-CM | POA: Insufficient documentation

## 2013-12-07 DIAGNOSIS — Z9119 Patient's noncompliance with other medical treatment and regimen: Secondary | ICD-10-CM | POA: Insufficient documentation

## 2013-12-07 DIAGNOSIS — I252 Old myocardial infarction: Secondary | ICD-10-CM | POA: Insufficient documentation

## 2013-12-07 DIAGNOSIS — Z9861 Coronary angioplasty status: Secondary | ICD-10-CM | POA: Insufficient documentation

## 2013-12-07 DIAGNOSIS — R079 Chest pain, unspecified: Secondary | ICD-10-CM | POA: Insufficient documentation

## 2013-12-07 HISTORY — DX: Opioid abuse, in remission: F11.11

## 2013-12-07 HISTORY — DX: Other chronic pain: G89.29

## 2013-12-07 HISTORY — DX: Alcohol abuse, uncomplicated: F10.10

## 2013-12-07 LAB — CBC
HCT: 44.7 % (ref 39.0–52.0)
Hemoglobin: 15.9 g/dL (ref 13.0–17.0)
MCH: 32.1 pg (ref 26.0–34.0)
MCHC: 35.6 g/dL (ref 30.0–36.0)
MCV: 90.1 fL (ref 78.0–100.0)
Platelets: 234 10*3/uL (ref 150–400)
RBC: 4.96 MIL/uL (ref 4.22–5.81)
RDW: 12.9 % (ref 11.5–15.5)
WBC: 8 K/uL (ref 4.0–10.5)

## 2013-12-07 LAB — RAPID URINE DRUG SCREEN, HOSP PERFORMED
Amphetamines: NOT DETECTED
Barbiturates: NOT DETECTED
Benzodiazepines: NOT DETECTED

## 2013-12-07 LAB — BASIC METABOLIC PANEL WITH GFR
BUN: 4 mg/dL — ABNORMAL LOW (ref 6–23)
Chloride: 101 meq/L (ref 96–112)
Glucose, Bld: 113 mg/dL — ABNORMAL HIGH (ref 70–99)

## 2013-12-07 LAB — BASIC METABOLIC PANEL
CO2: 25 mEq/L (ref 19–32)
Calcium: 8.8 mg/dL (ref 8.4–10.5)
Creatinine, Ser: 0.94 mg/dL (ref 0.50–1.35)
GFR calc Af Amer: >90 mL/min (ref >90)
GFR calc non Af Amer: >90 mL/min (ref >90)
Potassium: 4.2 mEq/L (ref 3.5–5.1)
Sodium: 140 mEq/L (ref 135–145)

## 2013-12-07 LAB — TROPONIN I: Troponin I: <0.3 ng/mL (ref <0.30)

## 2013-12-07 LAB — POCT I-STAT TROPONIN I: Troponin i, poc: 0.01 ng/mL (ref 0.00–0.08)

## 2013-12-07 MED ORDER — SODIUM CHLORIDE 0.9 % IV BOLUS (SEPSIS)
1000.0000 mL | Freq: Once | INTRAVENOUS | Status: AC
  Administered 2013-12-07: 1000 mL via INTRAVENOUS

## 2013-12-07 MED ORDER — VITAMIN B-1 100 MG PO TABS: 100.0000 mg | ORAL_TABLET | Freq: Every day | ORAL | Status: DC

## 2013-12-07 MED ORDER — LORAZEPAM 2 MG/ML IJ SOLN
1.0000 mg | Freq: Once | INTRAMUSCULAR | Status: AC
  Administered 2013-12-07: 1 mg via INTRAVENOUS
  Filled 2013-12-07: qty 1

## 2013-12-07 MED ORDER — LORAZEPAM 2 MG/ML IJ SOLN: 0.0000 mg | Freq: Four times a day (QID) | INTRAMUSCULAR | Status: DC

## 2013-12-07 MED ORDER — LORAZEPAM 2 MG/ML IJ SOLN: 0.0000 mg | Freq: Two times a day (BID) | INTRAMUSCULAR | Status: DC

## 2013-12-07 MED ORDER — THIAMINE HCL 100 MG/ML IJ SOLN: 100.0000 mg | Freq: Every day | INTRAMUSCULAR | Status: DC

## 2013-12-07 NOTE — ED Notes (Signed)
PT walking out room dressed in street cloths. Pt requesting to go out side . PT directed  Into room D36 . Pt refuses to leave B/P and monitor on.

## 2013-12-07 NOTE — ED Notes (Signed)
PT walking out of room to report he needs to void.

## 2013-12-07 NOTE — ED Notes (Addendum)
Received pt via EMS with c/o center chest pressure that radiated out to both sides of chest with nausea. Pt ran out of his prescription medications, last took Plavix 4 days ago. Pt admits to ETOH use. Pt given 1 nitroglycerin and 325mg  ASA by EMS.

## 2013-12-07 NOTE — ED Provider Notes (Signed)
Assumed care from [place] at 1600.  Please see her note for full details.  In short, Darrell [name] is a 52 y.o. male with history of alcoholism and homelessness.  EMS was called as he was having some chest pain.  Patient already had chest pain workup done earlier this week.  On exam, patient clearly intoxicated and shirtless.  Plan to do delta troponin and reassess.  Upon reassessment, patient feeling better.  No current symptoms.  Patient clinically sober.  Second troponin negative.  Doubt PE, ACS, dissection, or other serious cause of chest pain.  Patient discharged.  Arloa Koh, MD 12/08/13 [phone]

## 2013-12-07 NOTE — ED Provider Notes (Signed)
CSN: [id number]     Arrival date & time 12/07/13  1322 History   First MD Initiated Contact with Patient 12/07/13 1341     Chief Complaint  Patient presents with  . Alcohol Intoxication  . Chest Pain   (Consider location/radiation/quality/duration/timing/severity/associated sxs/prior Treatment) HPI Comments: Patient is a 52 yo M PMHx significant for Hepatitis C, TB, MI, Anxiety, ETOH abuse presenting to the ED for acute onset central chest pressure w/o radiation that began 1-2 hours ago. Patient denies any associated SOB, nausea, vomiting, or diaphoresis. He states his pain is gone at this time after receiving 325mg  ASA and SL nitro. He does endorse a headache after receiving the nitroglycerin. Patient does endorse drinking ETOH today. He states he drank four "4 Locos" this afternoon. He denies any recreational drug use. Patient recently had a stent placed on 10/12/13 in [place], [place]. Patient was also seen at [facility] 10/2013 for chest pain with negative work up, all noted in H&P from admission on 11/30/12. Patient has been non-compliant w/ home meds for one month. Patient was admitted for two days for CP r/o on 11/30/13 without acute finding. Pt has scheduled follow up on 12/25/12. Patient is a level V caveat d/t ETOH intoxication.   The history is provided by the patient and medical records.    Past Medical History  Diagnosis Date  . Hepatitis C   . Tuberculosis   . Myocardial infarction   . Anxiety    Past Surgical History  Procedure Laterality Date  . Coronary stent placement     No family history on file. History  Substance Use Topics  . Smoking status: Current Every Day Smoker  . Smokeless tobacco: Never Used  . Alcohol Use: Yes     Comment: pt had pint whiskey, 2 24 oz beers, 1 can of beer, more whiskey today    Review of Systems  Unable to perform ROS: Other    Allergies  Review of patient's allergies indicates no known allergies.  Home Medications   Current  Outpatient Rx  Name  Route  Sig  Dispense  Refill  . aspirin 325 MG tablet   Oral   Take 325 mg by mouth daily.         Marland Kitchen aspirin EC 81 MG tablet   Oral   Take 81 mg by mouth daily.         . clopidogrel (PLAVIX) 75 MG tablet   Oral   Take 75 mg by mouth daily with breakfast.         . folic acid (FOLVITE) 1 MG tablet   Oral   Take 2 mg by mouth daily.         Marland Kitchen LORazepam (ATIVAN) 2 MG tablet   Oral   Take 2 mg by mouth as directed. Take 1 tab every 8 hrs (today 12/8). Take 1 tab every 12 hrs (12/9). Take 1 tab (12/10).         . nitroGLYCERIN (NITROSTAT) 0.4 MG SL tablet   Sublingual   Place 0.4 mg under the tongue every 5 (five) minutes as needed for chest pain.          BP 137/90  Pulse 132  Temp(Src) 98.6 F (37 C) (Oral)  Resp 18  Ht 5\' 11"  (1.803 m)  Wt 180 lb (81.647 kg)  BMI 25.12 kg/m2  SpO2 91% Physical Exam  Constitutional: He is oriented to person, place, and time. He appears well-developed and well-nourished. No distress.  Patient appears to be intoxicated.   HENT:  Head: Normocephalic and atraumatic.  Right Ear: External ear normal.  Left Ear: External ear normal.  Nose: Nose normal.  Mouth/Throat: No oropharyngeal exudate.  Eyes: Conjunctivae are normal.  Neck: Neck supple.  Cardiovascular: Normal rate, regular rhythm, normal heart sounds and intact distal pulses.   Pulmonary/Chest: Effort normal and breath sounds normal. No respiratory distress. He exhibits no tenderness.  Abdominal: Soft. Bowel sounds are normal. There is no tenderness.  Musculoskeletal: Normal range of motion. He exhibits no edema.  Neurological: He is alert and oriented to person, place, and time. Gait normal. GCS eye subscore is 4. GCS verbal subscore is 5. GCS motor subscore is 6.  Skin: Skin is warm and dry. He is not diaphoretic.    ED Course  Procedures (including critical care time) Medications  LORazepam (ATIVAN) injection 0-4 mg (not administered)     Followed by  LORazepam (ATIVAN) injection 0-4 mg (not administered)  thiamine (VITAMIN B-1) tablet 100 mg (not administered)    Or  thiamine (B-1) injection 100 mg (not administered)  sodium chloride 0.9 % bolus 1,000 mL (not administered)  LORazepam (ATIVAN) injection 1 mg (1 mg Intravenous Given 12/07/13 1525)    Labs Review Labs Reviewed  BASIC METABOLIC PANEL - Abnormal; Notable for the following:    Glucose, Bld 113 (*)    BUN 4 (*)    All other components within normal limits  CBC  URINE RAPID DRUG SCREEN (HOSP PERFORMED)  POCT I-STAT TROPONIN I   Imaging Review Dg Chest 2 View  12/07/2013   CLINICAL DATA:  Chest pain  EXAM: CHEST  2 VIEW  COMPARISON:  11/30/2013  FINDINGS: Mild retrocardiac/ left lower lobe opacity, favored to reflect atelectasis, pneumonia not excluded. No pleural effusion or pneumothorax.  The heart is normal in size.  Mild degenerative changes of the visualized thoracolumbar spine.  IMPRESSION: Mild retrocardiac/left lower lobe opacity, favored to reflect atelectasis, pneumonia not excluded.   Electronically Signed   By: Charline Bills M.D.   On: 12/07/2013 14:54    EKG Interpretation    Date/Time:  Saturday December 07 2013 14:06:17 EST Ventricular Rate:  131 PR Interval:  87 QRS Duration: 88 QT Interval:  323 QTC Calculation: 477 R Axis:   29 Text Interpretation:  Sinus tachycardia Consider left ventricular hypertrophy Inferior infarct, old , seen on previous ECG on 11/30/13 Abnormal ECG Confirmed by [facility]  MD, MICHEAL ([id number]) on 12/07/2013 2:26:13 PM            MDM   1. Chest pain   2. ETOH abuse     Afebrile, NAD, non-toxic appearing, AAOx4. Vitals reviewed.  Patient presenting 5 days after being discharged from hospital for CP r/o w/ CP. Pt is currently CP free in ED. First troponin negative. CXR suggests atelectasis, but no other acute cardiopulmonary process. Plan is, given recent admission w/ negative CP r/o 5 days ago, will delta  trop patient and make sure he remains CP free while in ED and provide prescription for SL nitroglycerin at home. Will give IVF and Ativan and re-evaluate vitals as patient tachycardiac upon arrival. Will again advise patient that he needs to follow up with Cardiologist at scheduled appointment. Patient will be signed out to the Resident, Dr. Piedad Climes and Dr. Blinda Leatherwood. Patient is CP free at time of shift change. Patient d/w with Dr. Oletta Lamas, agrees with plan.       Jeannetta Ellis, PA-C 12/07/13 1551  Jeannetta Ellis, PA-C 12/07/13 1552

## 2013-12-07 NOTE — ED Provider Notes (Signed)
Medical screening examination/treatment/procedure(s) were conducted as a shared visit with non-physician practitioner(s) and myself.  I personally evaluated the patient during the encounter.  EKG Interpretation    Date/Time:  Saturday December 07 2013 14:06:17 EST Ventricular Rate:  131 PR Interval:  87 QRS Duration: 88 QT Interval:  323 QTC Calculation: 477 R Axis:   29 Text Interpretation:  Sinus tachycardia Consider left ventricular hypertrophy Inferior infarct, old , seen on previous ECG on 11/30/13 Abnormal ECG Confirmed by [facility]  MD, MICHEAL ([id number]) on 12/07/2013 2:26:13 PM            Pt is intoxicated.  Pt was just admitted for CP and r/o MI on 12/6 to 12/8.  Follow up with cardiologist was arranged.  Pt had stent placed in [place] in October.  Discharge summary in [facility] alludes to this.  Pt is pain free after NTG.  Will cycle markers.  If he remains pain free with serial neg troponins, pt can be discharged to home with close follow up with cardiology as previously arranged.  CP may be related to alcohol, gastrittis, esophageal spasm, etc.  Although HR is faster, no new ischemic changes noted on ECG.     Impression: Alcohol intoxication Non specific CP H/o coronary disease    Gavin Pound. Oletta Lamas, MD 12/07/13 [phone]

## 2013-12-08 ENCOUNTER — Emergency Department: Payer: Self-pay | Admitting: Emergency Medicine

## 2013-12-08 ENCOUNTER — Encounter (HOSPITAL_COMMUNITY): Payer: Self-pay | Admitting: Emergency Medicine

## 2013-12-08 ENCOUNTER — Emergency Department (HOSPITAL_COMMUNITY)
Admission: EM | Admit: 2013-12-08 | Discharge: 2013-12-08 | Disposition: A | Discharge status: Home / Self Care | Payer: Medicaid - Out of State | Attending: Emergency Medicine | Admitting: Emergency Medicine

## 2013-12-08 DIAGNOSIS — Z7901 Long term (current) use of anticoagulants: Secondary | ICD-10-CM | POA: Insufficient documentation

## 2013-12-08 DIAGNOSIS — Z7982 Long term (current) use of aspirin: Secondary | ICD-10-CM | POA: Insufficient documentation

## 2013-12-08 DIAGNOSIS — F1010 Alcohol abuse, uncomplicated: Secondary | ICD-10-CM | POA: Diagnosis present

## 2013-12-08 DIAGNOSIS — I252 Old myocardial infarction: Secondary | ICD-10-CM | POA: Insufficient documentation

## 2013-12-08 DIAGNOSIS — B1920 Unspecified viral hepatitis C without hepatic coma: Secondary | ICD-10-CM | POA: Insufficient documentation

## 2013-12-08 DIAGNOSIS — G8929 Other chronic pain: Secondary | ICD-10-CM | POA: Insufficient documentation

## 2013-12-08 DIAGNOSIS — Z5900 Homelessness unspecified: Secondary | ICD-10-CM | POA: Insufficient documentation

## 2013-12-08 DIAGNOSIS — Z8611 Personal history of tuberculosis: Secondary | ICD-10-CM | POA: Insufficient documentation

## 2013-12-08 DIAGNOSIS — F1111 Opioid abuse, in remission: Secondary | ICD-10-CM | POA: Insufficient documentation

## 2013-12-08 DIAGNOSIS — F17200 Nicotine dependence, unspecified, uncomplicated: Secondary | ICD-10-CM | POA: Insufficient documentation

## 2013-12-08 DIAGNOSIS — R079 Chest pain, unspecified: Secondary | ICD-10-CM

## 2013-12-08 DIAGNOSIS — R072 Precordial pain: Secondary | ICD-10-CM | POA: Insufficient documentation

## 2013-12-08 DIAGNOSIS — R000 Tachycardia, unspecified: Secondary | ICD-10-CM | POA: Insufficient documentation

## 2013-12-08 DIAGNOSIS — Z79899 Other long term (current) drug therapy: Secondary | ICD-10-CM | POA: Insufficient documentation

## 2013-12-08 DIAGNOSIS — F411 Generalized anxiety disorder: Secondary | ICD-10-CM | POA: Insufficient documentation

## 2013-12-08 LAB — BASIC METABOLIC PANEL
BUN: 3 mg/dL — ABNORMAL LOW (ref 6–23)
Chloride: 104 mEq/L (ref 96–112)
Creatinine, Ser: 0.83 mg/dL (ref 0.50–1.35)
GFR calc non Af Amer: >90 mL/min (ref >90)
Glucose, Bld: 91 mg/dL (ref 70–99)
Potassium: 3.8 mEq/L (ref 3.5–5.1)

## 2013-12-08 LAB — CBC
HCT: 45.5 % (ref 39.0–52.0)
Hemoglobin: 16 g/dL (ref 13.0–17.0)
MCHC: 35.2 g/dL (ref 30.0–36.0)
WBC: 7.9 10*3/uL (ref 4.0–10.5)

## 2013-12-08 LAB — TROPONIN I: Troponin I: <0.3 ng/mL (ref <0.30)

## 2013-12-08 NOTE — ED Notes (Signed)
Social worker at bed side to assist PT with community contacts for health care.

## 2013-12-08 NOTE — ED Provider Notes (Signed)
8:11 AM Accepted care from Dr. Redgie Grayer. 88M w/ hx of hep c, etoh abuse, heroin abuse, self reported hx of MI, homeless who pw cp. Pt seen here yesterday for the same. He appears well on exam. Requesting SW to aid him in getting his meds. Will have SW see. Awaiting delta trop.   9:59 AM: Delta trop neg. SW has seen and given info. Pt continues to appear well.  I have discussed the diagnosis/risks/treatment options with the patient and believe the pt to be eligible for discharge home to follow-up with and establish w/ a pcp as needed. We also discussed returning to the ED immediately if new or worsening sx occur. We discussed the sx which are most concerning (e.g., chest pain, sob, fever) that necessitate immediate return. Any new prescriptions provided to the patient are listed below.  New Prescriptions   No medications on file   Clinical Impression 1. Chest pain   2. Alcohol abuse      Junius Argyle, MD 12/09/13 1100
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
## 2013-12-08 NOTE — Progress Notes (Signed)
Met patient at bedside role of Case manager explained .Patient reports he is here secondary to alcohol issues.Patient reports he is homeless .  Social Research scientist (life sciences).Case Manager educated patient on importance of establishing a PCP.Patient provided his consent   For this Case manager to e-mail the [facility] and help set up his PCP appointment.Patient verified his best contact number.Patient  Reports MEDICAID OUT OF STATE -MEDICAID [place] STATE [place]. Patient provided with a MEDICAID resource pack so he can contact [employer].  And transfer his Insurance to [facility].Patient reports he gets his medications with [facility] and uses [facility].Patient reports he needs to transfer his   Records to [facility].Patient educated on the Importance of medication compliance and PCP follow up.Patient reports he understands written resources   And verbal education provided today.No further Case Manager needs.
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
## 2013-12-08 NOTE — ED Notes (Signed)
Pt stated drinking to forget his heart attack.  Drinking strong beer, 12% etoh mostly. Last night drank 4 strong beer in the afternoon and other beers all through the day. Stated being on heavy beng drinking for about a month now. Wanted to get help.

## 2013-12-08 NOTE — ED Notes (Signed)
Patient here for several reasons.  Originally here for chest pain with tingling in his arms.  Also asking for help for detox and c/o knees hurting

## 2013-12-09 LAB — COMPREHENSIVE METABOLIC PANEL
Alkaline Phosphatase: 90 U/L
Anion Gap: 8 (ref 7–16)
BUN: 4 mg/dL — ABNORMAL LOW (ref 7–18)
Calcium, Total: 8.9 mg/dL (ref 8.5–10.1)
Creatinine: 1.01 mg/dL (ref 0.60–1.30)
EGFR (African American): >60
EGFR (Non-African Amer.): >60
Osmolality: 274 (ref 275–301)
Potassium: 3.5 mmol/L (ref 3.5–5.1)
SGPT (ALT): 117 U/L — ABNORMAL HIGH (ref 12–78)
Total Protein: 7.9 g/dL (ref 6.4–8.2)

## 2013-12-09 LAB — DRUG SCREEN, URINE
Amphetamines, Ur Screen: NEGATIVE (ref ?–1000)
Barbiturates, Ur Screen: NEGATIVE (ref ?–200)
Benzodiazepine, Ur Scrn: NEGATIVE (ref ?–200)
Cannabinoid 50 Ng, Ur ~~LOC~~: NEGATIVE (ref ?–50)
Methadone, Ur Screen: NEGATIVE (ref ?–300)
Phencyclidine (PCP) Ur S: NEGATIVE (ref ?–25)
Tricyclic, Ur Screen: NEGATIVE (ref ?–1000)

## 2013-12-09 LAB — CBC
HCT: 47.3 % (ref 40.0–52.0)
HGB: 16.2 g/dL (ref 13.0–18.0)
MCH: 31 pg (ref 26.0–34.0)
Platelet: 234 10*3/uL (ref 150–440)
RBC: 5.22 10*6/uL (ref 4.40–5.90)
RDW: 13.7 % (ref 11.5–14.5)

## 2013-12-09 LAB — ACETAMINOPHEN LEVEL: Acetaminophen: <2 ug/mL

## 2013-12-09 LAB — SALICYLATE LEVEL: Salicylates, Serum: 2.3 mg/dL

## 2013-12-13 ENCOUNTER — Emergency Department: Payer: Self-pay | Admitting: Emergency Medicine

## 2013-12-13 LAB — DRUG SCREEN, URINE
Benzodiazepine, Ur Scrn: POSITIVE (ref ?–200)
Cannabinoid 50 Ng, Ur ~~LOC~~: NEGATIVE (ref ?–50)
Methadone, Ur Screen: NEGATIVE (ref ?–300)
Opiate, Ur Screen: POSITIVE (ref ?–300)
Phencyclidine (PCP) Ur S: NEGATIVE (ref ?–25)

## 2013-12-13 LAB — COMPREHENSIVE METABOLIC PANEL
Albumin: 3.5 g/dL (ref 3.4–5.0)
BUN: 13 mg/dL (ref 7–18)
Bilirubin,Total: 0.4 mg/dL (ref 0.2–1.0)
Calcium, Total: 7.9 mg/dL — ABNORMAL LOW (ref 8.5–10.1)
Chloride: 106 mmol/L (ref 98–107)
Co2: 24 mmol/L (ref 21–32)
Creatinine: 1.13 mg/dL (ref 0.60–1.30)
EGFR (African American): >60
EGFR (Non-African Amer.): >60
Osmolality: 274 (ref 275–301)
Potassium: 3.9 mmol/L (ref 3.5–5.1)
SGOT(AST): 58 U/L — ABNORMAL HIGH (ref 15–37)
Sodium: 137 mmol/L (ref 136–145)
Total Protein: 7.2 g/dL (ref 6.4–8.2)

## 2013-12-13 LAB — CBC
HCT: 45 % (ref 40.0–52.0)
Platelet: 184 10*3/uL (ref 150–440)
WBC: 9.8 10*3/uL (ref 3.8–10.6)

## 2013-12-13 LAB — SALICYLATE LEVEL: Salicylates, Serum: <1.7 mg/dL

## 2013-12-17 ENCOUNTER — Emergency Department: Payer: Self-pay | Admitting: Emergency Medicine

## 2013-12-17 ENCOUNTER — Encounter: Payer: Self-pay | Admitting: *Deleted

## 2013-12-17 LAB — URINALYSIS, COMPLETE
Bilirubin,UR: NEGATIVE
Blood: NEGATIVE
Glucose,UR: NEGATIVE mg/dL (ref 0–75)
Ketone: NEGATIVE
Nitrite: NEGATIVE
Protein: NEGATIVE
Specific Gravity: 1.002 (ref 1.003–1.030)
Squamous Epithelial: NONE SEEN
WBC UR: NONE SEEN /HPF (ref 0–5)

## 2013-12-17 LAB — PRO B NATRIURETIC PEPTIDE: B-Type Natriuretic Peptide: 163 pg/mL — ABNORMAL HIGH (ref 0–125)

## 2013-12-17 LAB — CBC
HCT: 46.8 % (ref 40.0–52.0)
HGB: 16.1 g/dL (ref 13.0–18.0)
MCH: 31.7 pg (ref 26.0–34.0)
MCHC: 34.4 g/dL (ref 32.0–36.0)
MCV: 92 fL (ref 80–100)
Platelet: 205 10*3/uL (ref 150–440)
RBC: 5.07 10*6/uL (ref 4.40–5.90)
RDW: 14.4 % (ref 11.5–14.5)
WBC: 8.1 10*3/uL (ref 3.8–10.6)

## 2013-12-17 LAB — BASIC METABOLIC PANEL
BUN: 5 mg/dL — ABNORMAL LOW (ref 7–18)
Calcium, Total: 9.2 mg/dL (ref 8.5–10.1)
Chloride: 106 mmol/L (ref 98–107)
Co2: 27 mmol/L (ref 21–32)
Creatinine: 0.86 mg/dL (ref 0.60–1.30)
EGFR (African American): >60
EGFR (Non-African Amer.): >60
Glucose: 76 mg/dL (ref 65–99)
Osmolality: 275 (ref 275–301)
Sodium: 140 mmol/L (ref 136–145)

## 2013-12-17 LAB — TROPONIN I: Troponin-I: 0.02 ng/mL

## 2013-12-17 LAB — ETHANOL
Ethanol %: 0.226 % — ABNORMAL HIGH (ref 0.000–0.080)
Ethanol: 226 mg/dL

## 2013-12-17 LAB — PROTIME-INR: Prothrombin Time: 11.9 secs (ref 11.5–14.7)

## 2013-12-18 ENCOUNTER — Inpatient Hospital Stay: Payer: Self-pay | Admitting: Psychiatry

## 2013-12-18 LAB — URINALYSIS, COMPLETE
Bacteria: NONE SEEN
Bilirubin,UR: NEGATIVE
Blood: NEGATIVE
Glucose,UR: NEGATIVE mg/dL (ref 0–75)
Ketone: NEGATIVE
Protein: NEGATIVE
RBC,UR: <1 /HPF (ref 0–5)
Squamous Epithelial: <1

## 2013-12-18 LAB — COMPREHENSIVE METABOLIC PANEL
Albumin: 3.6 g/dL (ref 3.4–5.0)
Alkaline Phosphatase: 75 U/L
Bilirubin,Total: 0.3 mg/dL (ref 0.2–1.0)
Calcium, Total: 8.5 mg/dL (ref 8.5–10.1)
Chloride: 104 mmol/L (ref 98–107)
EGFR (Non-African Amer.): >60
Glucose: 126 mg/dL — ABNORMAL HIGH (ref 65–99)
Potassium: 3.6 mmol/L (ref 3.5–5.1)
SGPT (ALT): 52 U/L (ref 12–78)
Sodium: 139 mmol/L (ref 136–145)

## 2013-12-18 LAB — ETHANOL
Ethanol %: 0.128 % — ABNORMAL HIGH (ref 0.000–0.080)
Ethanol: 128 mg/dL

## 2013-12-18 LAB — DRUG SCREEN, URINE
Amphetamines, Ur Screen: NEGATIVE (ref ?–1000)
Barbiturates, Ur Screen: NEGATIVE (ref ?–200)
Benzodiazepine, Ur Scrn: POSITIVE (ref ?–200)
Cannabinoid 50 Ng, Ur ~~LOC~~: POSITIVE (ref ?–50)
Cocaine Metabolite,Ur ~~LOC~~: NEGATIVE (ref ?–300)
MDMA (Ecstasy)Ur Screen: NEGATIVE (ref ?–500)
Opiate, Ur Screen: NEGATIVE (ref ?–300)
Phencyclidine (PCP) Ur S: NEGATIVE (ref ?–25)
Tricyclic, Ur Screen: NEGATIVE (ref ?–1000)

## 2013-12-18 LAB — CBC
HGB: 15 g/dL (ref 13.0–18.0)
MCV: 92 fL (ref 80–100)
Platelet: 185 10*3/uL (ref 150–440)
RBC: 4.88 10*6/uL (ref 4.40–5.90)
RDW: 14.2 % (ref 11.5–14.5)

## 2013-12-18 LAB — TROPONIN I: Troponin-I: 0.04 ng/mL

## 2013-12-18 LAB — TSH: Thyroid Stimulating Horm: 1.26 u[IU]/mL

## 2013-12-18 LAB — SALICYLATE LEVEL: Salicylates, Serum: <1.7 mg/dL

## 2013-12-25 ENCOUNTER — Ambulatory Visit: Payer: Self-pay | Admitting: Cardiology

## 2013-12-28 ENCOUNTER — Emergency Department: Payer: Self-pay | Admitting: Emergency Medicine

## 2013-12-28 LAB — COMPREHENSIVE METABOLIC PANEL
ALBUMIN: 3.9 g/dL (ref 3.4–5.0)
ALK PHOS: 76 U/L
Anion Gap: 6 — ABNORMAL LOW (ref 7–16)
BILIRUBIN TOTAL: 0.6 mg/dL (ref 0.2–1.0)
BUN: 9 mg/dL (ref 7–18)
CHLORIDE: 103 mmol/L (ref 98–107)
CO2: 26 mmol/L (ref 21–32)
Calcium, Total: 8.4 mg/dL — ABNORMAL LOW (ref 8.5–10.1)
Creatinine: 0.88 mg/dL (ref 0.60–1.30)
GLUCOSE: 109 mg/dL — AB (ref 65–99)
OSMOLALITY: 269 (ref 275–301)
POTASSIUM: 3.7 mmol/L (ref 3.5–5.1)
SGOT(AST): 59 U/L — ABNORMAL HIGH (ref 15–37)
SGPT (ALT): 68 U/L (ref 12–78)
SODIUM: 135 mmol/L — AB (ref 136–145)
Total Protein: 7.8 g/dL (ref 6.4–8.2)

## 2013-12-28 LAB — DRUG SCREEN, URINE
Amphetamines, Ur Screen: NEGATIVE (ref ?–1000)
Barbiturates, Ur Screen: NEGATIVE (ref ?–200)
Benzodiazepine, Ur Scrn: POSITIVE (ref ?–200)
Cannabinoid 50 Ng, Ur ~~LOC~~: POSITIVE (ref ?–50)
Cocaine Metabolite,Ur ~~LOC~~: NEGATIVE (ref ?–300)
MDMA (Ecstasy)Ur Screen: NEGATIVE (ref ?–500)
Methadone, Ur Screen: NEGATIVE (ref ?–300)
Opiate, Ur Screen: NEGATIVE (ref ?–300)
PHENCYCLIDINE (PCP) UR S: NEGATIVE (ref ?–25)
TRICYCLIC, UR SCREEN: NEGATIVE (ref ?–1000)

## 2013-12-28 LAB — CBC
HCT: 49.3 % (ref 40.0–52.0)
HGB: 16.6 g/dL (ref 13.0–18.0)
MCH: 31.2 pg (ref 26.0–34.0)
MCHC: 33.7 g/dL (ref 32.0–36.0)
MCV: 93 fL (ref 80–100)
Platelet: 318 10*3/uL (ref 150–440)
RBC: 5.32 10*6/uL (ref 4.40–5.90)
RDW: 14.5 % (ref 11.5–14.5)
WBC: 8.9 10*3/uL (ref 3.8–10.6)

## 2013-12-28 LAB — TROPONIN I: Troponin-I: 0.02 ng/mL

## 2013-12-28 LAB — CK TOTAL AND CKMB (NOT AT ARMC)
CK, TOTAL: 460 U/L — AB (ref 35–232)
CK-MB: 4.8 ng/mL — ABNORMAL HIGH (ref 0.5–3.6)

## 2013-12-28 LAB — ETHANOL
Ethanol %: 0.17 % — ABNORMAL HIGH (ref 0.000–0.080)
Ethanol: 170 mg/dL

## 2013-12-28 LAB — SALICYLATE LEVEL

## 2013-12-28 LAB — ACETAMINOPHEN LEVEL: Acetaminophen: <2 ug/mL

## 2013-12-28 LAB — TSH: Thyroid Stimulating Horm: 1 u[IU]/mL

## 2014-01-01 ENCOUNTER — Encounter: Payer: Self-pay | Admitting: Cardiology

## 2014-01-03 ENCOUNTER — Encounter: Payer: Self-pay | Admitting: Cardiology

## 2014-02-04 LAB — BASIC METABOLIC PANEL
Anion Gap: 8 (ref 7–16)
BUN: 5 mg/dL — ABNORMAL LOW (ref 7–18)
CO2: 25 mmol/L (ref 21–32)
Calcium, Total: 8.4 mg/dL — ABNORMAL LOW (ref 8.5–10.1)
Chloride: 107 mmol/L (ref 98–107)
Creatinine: 0.95 mg/dL (ref 0.60–1.30)
EGFR (African American): >60
Glucose: 94 mg/dL (ref 65–99)
Osmolality: 276 (ref 275–301)
Potassium: 3.4 mmol/L — ABNORMAL LOW (ref 3.5–5.1)
Sodium: 140 mmol/L (ref 136–145)

## 2014-02-04 LAB — DRUG SCREEN, URINE
Amphetamines, Ur Screen: NEGATIVE (ref ?–1000)
Barbiturates, Ur Screen: NEGATIVE (ref ?–200)
Benzodiazepine, Ur Scrn: POSITIVE (ref ?–200)
CANNABINOID 50 NG, UR ~~LOC~~: POSITIVE (ref ?–50)
Cocaine Metabolite,Ur ~~LOC~~: NEGATIVE (ref ?–300)
MDMA (Ecstasy)Ur Screen: NEGATIVE (ref ?–500)
Methadone, Ur Screen: NEGATIVE (ref ?–300)
Opiate, Ur Screen: NEGATIVE (ref ?–300)
Phencyclidine (PCP) Ur S: NEGATIVE (ref ?–25)
Tricyclic, Ur Screen: NEGATIVE (ref ?–1000)

## 2014-02-04 LAB — CBC
HCT: 47 % (ref 40.0–52.0)
HGB: 16.1 g/dL (ref 13.0–18.0)
MCH: 32.6 pg (ref 26.0–34.0)
MCHC: 34.3 g/dL (ref 32.0–36.0)
MCV: 95 fL (ref 80–100)
PLATELETS: 251 10*3/uL (ref 150–440)
RBC: 4.94 10*6/uL (ref 4.40–5.90)
RDW: 14.4 % (ref 11.5–14.5)
WBC: 9.8 10*3/uL (ref 3.8–10.6)

## 2014-02-04 LAB — URINALYSIS, COMPLETE
Bacteria: NONE SEEN
Bilirubin,UR: NEGATIVE
Blood: NEGATIVE
GLUCOSE, UR: NEGATIVE mg/dL (ref 0–75)
Ketone: NEGATIVE
Leukocyte Esterase: NEGATIVE
Nitrite: NEGATIVE
PH: 6 (ref 4.5–8.0)
PROTEIN: NEGATIVE
RBC,UR: NONE SEEN /HPF (ref 0–5)
SPECIFIC GRAVITY: 1.004 (ref 1.003–1.030)
Squamous Epithelial: NONE SEEN
WBC UR: NONE SEEN /HPF (ref 0–5)

## 2014-02-04 LAB — ETHANOL
Ethanol %: 0.266 % — ABNORMAL HIGH (ref 0.000–0.080)
Ethanol: 266 mg/dL

## 2014-02-04 LAB — TROPONIN I
Troponin-I: <0.02 ng/mL
Troponin-I: 0.02 ng/mL

## 2014-02-04 LAB — SALICYLATE LEVEL: Salicylates, Serum: 2.1 mg/dL

## 2014-02-04 LAB — ACETAMINOPHEN LEVEL: Acetaminophen: <2 ug/mL

## 2014-02-05 ENCOUNTER — Inpatient Hospital Stay: Payer: Self-pay | Admitting: Psychiatry

## 2014-03-09 ENCOUNTER — Emergency Department: Payer: Self-pay | Admitting: Emergency Medicine

## 2014-03-09 LAB — URINALYSIS, COMPLETE
BACTERIA: NONE SEEN
BLOOD: NEGATIVE
Bilirubin,UR: NEGATIVE
Glucose,UR: 50 mg/dL (ref 0–75)
Ketone: NEGATIVE
LEUKOCYTE ESTERASE: NEGATIVE
Nitrite: NEGATIVE
PH: 5 (ref 4.5–8.0)
Protein: NEGATIVE
RBC,UR: <1 /HPF (ref 0–5)
SQUAMOUS EPITHELIAL: NONE SEEN
Specific Gravity: 1.008 (ref 1.003–1.030)
WBC UR: <1 /HPF (ref 0–5)

## 2014-03-09 LAB — CBC
HCT: 52.4 % — AB (ref 40.0–52.0)
HGB: 17.8 g/dL (ref 13.0–18.0)
MCH: 31.5 pg (ref 26.0–34.0)
MCHC: 34.1 g/dL (ref 32.0–36.0)
MCV: 93 fL (ref 80–100)
PLATELETS: 233 10*3/uL (ref 150–440)
RBC: 5.66 10*6/uL (ref 4.40–5.90)
RDW: 13.6 % (ref 11.5–14.5)
WBC: 12.2 10*3/uL — ABNORMAL HIGH (ref 3.8–10.6)

## 2014-03-09 LAB — DRUG SCREEN, URINE
Amphetamines, Ur Screen: NEGATIVE (ref ?–1000)
BENZODIAZEPINE, UR SCRN: NEGATIVE (ref ?–200)
Barbiturates, Ur Screen: NEGATIVE (ref ?–200)
CANNABINOID 50 NG, UR ~~LOC~~: NEGATIVE (ref ?–50)
Cocaine Metabolite,Ur ~~LOC~~: NEGATIVE (ref ?–300)
MDMA (ECSTASY) UR SCREEN: NEGATIVE (ref ?–500)
Methadone, Ur Screen: NEGATIVE (ref ?–300)
OPIATE, UR SCREEN: NEGATIVE (ref ?–300)
Phencyclidine (PCP) Ur S: NEGATIVE (ref ?–25)
TRICYCLIC, UR SCREEN: NEGATIVE (ref ?–1000)

## 2014-03-09 LAB — COMPREHENSIVE METABOLIC PANEL
ALK PHOS: 74 U/L
ALT: 47 U/L (ref 12–78)
Albumin: 4.3 g/dL (ref 3.4–5.0)
Anion Gap: 3 — ABNORMAL LOW (ref 7–16)
BILIRUBIN TOTAL: 0.3 mg/dL (ref 0.2–1.0)
BUN: 8 mg/dL (ref 7–18)
CALCIUM: 8.5 mg/dL (ref 8.5–10.1)
CREATININE: 0.87 mg/dL (ref 0.60–1.30)
Chloride: 109 mmol/L — ABNORMAL HIGH (ref 98–107)
Co2: 28 mmol/L (ref 21–32)
EGFR (African American): >60
EGFR (Non-African Amer.): >60
GLUCOSE: 93 mg/dL (ref 65–99)
Osmolality: 277 (ref 275–301)
POTASSIUM: 3.9 mmol/L (ref 3.5–5.1)
SGOT(AST): 39 U/L — ABNORMAL HIGH (ref 15–37)
Sodium: 140 mmol/L (ref 136–145)
Total Protein: 8.3 g/dL — ABNORMAL HIGH (ref 6.4–8.2)

## 2014-03-09 LAB — TSH: Thyroid Stimulating Horm: 0.83 u[IU]/mL

## 2014-03-09 LAB — ETHANOL
ETHANOL LVL: 240 mg/dL
Ethanol %: 0.24 % — ABNORMAL HIGH (ref 0.000–0.080)

## 2014-03-09 LAB — TROPONIN I
Troponin-I: <0.02 ng/mL
Troponin-I: <0.02 ng/mL

## 2014-03-09 LAB — ACETAMINOPHEN LEVEL: Acetaminophen: <2 ug/mL

## 2014-03-09 LAB — SALICYLATE LEVEL: Salicylates, Serum: 1.9 mg/dL

## 2014-03-10 LAB — TROPONIN I: Troponin-I: <0.02 ng/mL

## 2014-03-15 ENCOUNTER — Emergency Department (HOSPITAL_COMMUNITY): Payer: Medicaid - Out of State

## 2014-03-15 ENCOUNTER — Emergency Department (HOSPITAL_COMMUNITY)
Admission: EM | Admit: 2014-03-15 | Discharge: 2014-03-15 | Disposition: A | Discharge status: Home / Self Care | Payer: Medicaid - Out of State | Attending: Emergency Medicine | Admitting: Emergency Medicine

## 2014-03-15 ENCOUNTER — Other Ambulatory Visit: Payer: Self-pay

## 2014-03-15 ENCOUNTER — Encounter (HOSPITAL_COMMUNITY): Payer: Self-pay | Admitting: Emergency Medicine

## 2014-03-15 DIAGNOSIS — R000 Tachycardia, unspecified: Secondary | ICD-10-CM | POA: Insufficient documentation

## 2014-03-15 DIAGNOSIS — R079 Chest pain, unspecified: Secondary | ICD-10-CM

## 2014-03-15 DIAGNOSIS — Z8611 Personal history of tuberculosis: Secondary | ICD-10-CM | POA: Insufficient documentation

## 2014-03-15 DIAGNOSIS — R0789 Other chest pain: Secondary | ICD-10-CM | POA: Insufficient documentation

## 2014-03-15 DIAGNOSIS — F1210 Cannabis abuse, uncomplicated: Secondary | ICD-10-CM | POA: Insufficient documentation

## 2014-03-15 DIAGNOSIS — Z8659 Personal history of other mental and behavioral disorders: Secondary | ICD-10-CM | POA: Insufficient documentation

## 2014-03-15 DIAGNOSIS — G8929 Other chronic pain: Secondary | ICD-10-CM | POA: Insufficient documentation

## 2014-03-15 DIAGNOSIS — F1310 Sedative, hypnotic or anxiolytic abuse, uncomplicated: Secondary | ICD-10-CM | POA: Insufficient documentation

## 2014-03-15 DIAGNOSIS — I252 Old myocardial infarction: Secondary | ICD-10-CM | POA: Insufficient documentation

## 2014-03-15 DIAGNOSIS — Z8619 Personal history of other infectious and parasitic diseases: Secondary | ICD-10-CM | POA: Insufficient documentation

## 2014-03-15 DIAGNOSIS — Z5900 Homelessness unspecified: Secondary | ICD-10-CM | POA: Insufficient documentation

## 2014-03-15 DIAGNOSIS — F17200 Nicotine dependence, unspecified, uncomplicated: Secondary | ICD-10-CM | POA: Insufficient documentation

## 2014-03-15 DIAGNOSIS — F1010 Alcohol abuse, uncomplicated: Secondary | ICD-10-CM

## 2014-03-15 LAB — COMPREHENSIVE METABOLIC PANEL
ALT: 66 U/L — ABNORMAL HIGH (ref 0–53)
AST: 85 U/L — ABNORMAL HIGH (ref 0–37)
Albumin: 3.6 g/dL (ref 3.5–5.2)
Alkaline Phosphatase: 82 U/L (ref 39–117)
BUN: 9 mg/dL (ref 6–23)
CO2: 24 meq/L (ref 19–32)
Calcium: 9 mg/dL (ref 8.4–10.5)
Chloride: 95 mEq/L — ABNORMAL LOW (ref 96–112)
Creatinine, Ser: 0.75 mg/dL (ref 0.50–1.35)
GFR calc non Af Amer: >90 mL/min (ref >90)
GLUCOSE: 89 mg/dL (ref 70–99)
POTASSIUM: 3.5 meq/L — AB (ref 3.7–5.3)
Sodium: 138 mEq/L (ref 137–147)
TOTAL PROTEIN: 7.2 g/dL (ref 6.0–8.3)
Total Bilirubin: 0.6 mg/dL (ref 0.3–1.2)

## 2014-03-15 LAB — CBC
HCT: 46.5 % (ref 39.0–52.0)
Hemoglobin: 16.6 g/dL (ref 13.0–17.0)
MCH: 32.2 pg (ref 26.0–34.0)
MCHC: 35.7 g/dL (ref 30.0–36.0)
MCV: 90.1 fL (ref 78.0–100.0)
Platelets: 231 10*3/uL (ref 150–400)
RBC: 5.16 MIL/uL (ref 4.22–5.81)
RDW: 12.7 % (ref 11.5–15.5)
WBC: 10 10*3/uL (ref 4.0–10.5)

## 2014-03-15 LAB — RAPID URINE DRUG SCREEN, HOSP PERFORMED
Amphetamines: NOT DETECTED
Barbiturates: NOT DETECTED
Benzodiazepines: POSITIVE — AB
COCAINE: NOT DETECTED
Opiates: NOT DETECTED
Tetrahydrocannabinol: POSITIVE — AB

## 2014-03-15 LAB — I-STAT TROPONIN, ED
TROPONIN I, POC: 0.02 ng/mL (ref 0.00–0.08)
Troponin i, poc: 0.01 ng/mL (ref 0.00–0.08)

## 2014-03-15 LAB — ACETAMINOPHEN LEVEL: Acetaminophen (Tylenol), Serum: <15 ug/mL (ref 10–30)

## 2014-03-15 LAB — SALICYLATE LEVEL: Salicylate Lvl: <2 mg/dL — ABNORMAL LOW (ref 2.8–20.0)

## 2014-03-15 LAB — PRO B NATRIURETIC PEPTIDE: PRO B NATRI PEPTIDE: 254.6 pg/mL — AB (ref 0–125)

## 2014-03-15 LAB — ETHANOL: ALCOHOL ETHYL (B): 77 mg/dL — AB (ref 0–11)

## 2014-03-15 MED ORDER — ONDANSETRON HCL 4 MG PO TABS: 4.0000 mg | ORAL_TABLET | Freq: Three times a day (TID) | ORAL | Status: DC | PRN

## 2014-03-15 MED ORDER — VITAMIN B-1 100 MG PO TABS
100.0000 mg | ORAL_TABLET | Freq: Every day | ORAL | Status: DC
  Administered 2014-03-15: 100 mg via ORAL
  Filled 2014-03-15: qty 1

## 2014-03-15 MED ORDER — LORAZEPAM 1 MG PO TABS: 1.0000 mg | ORAL_TABLET | Freq: Three times a day (TID) | ORAL | Status: DC | PRN

## 2014-03-15 MED ORDER — THIAMINE HCL 100 MG/ML IJ SOLN: 100.0000 mg | Freq: Every day | INTRAMUSCULAR | Status: DC

## 2014-03-15 MED ORDER — LORAZEPAM 1 MG PO TABS
0.0000 mg | ORAL_TABLET | Freq: Two times a day (BID) | ORAL | Status: DC
Start: 2014-03-17 — End: 2014-03-15

## 2014-03-15 MED ORDER — LORAZEPAM 1 MG PO TABS
1.0000 mg | ORAL_TABLET | Freq: Once | ORAL | Status: AC
  Administered 2014-03-15: 1 mg via ORAL
  Filled 2014-03-15: qty 1

## 2014-03-15 MED ORDER — ACETAMINOPHEN 325 MG PO TABS: 650.0000 mg | ORAL_TABLET | ORAL | Status: DC | PRN

## 2014-03-15 MED ORDER — IBUPROFEN 200 MG PO TABS: 600.0000 mg | ORAL_TABLET | Freq: Three times a day (TID) | ORAL | Status: DC | PRN

## 2014-03-15 MED ORDER — LORAZEPAM 1 MG PO TABS: 0.0000 mg | ORAL_TABLET | Freq: Four times a day (QID) | ORAL | Status: DC

## 2014-03-15 NOTE — ED Notes (Signed)
Gave PT resource material for homeless shelters where he might be able to stay while receiving outpatient treatment for ETOH detox. Also gave PT bus pass

## 2014-03-15 NOTE — ED Notes (Addendum)
Rec'd call from [place] at [facility].  Pt has been declined by NP at [facility].  Marijean Bravo will try to place him at another facility.  Remains pain free.  Has been sleeping most of night, no distress or complaints.
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
## 2014-03-15 NOTE — BH Assessment (Signed)
Assessment complete. Per Lavell Luster, [facility] at [facility], adult unit is at capacity. Consulted with Serena Colonel, NP who declined Pt at [facility] due to his medical history. TTS will contact other facilities for placement. Notified Dr. Noemi Chapel and Magnus Ivan, RN of disposition.  Orpah Greek Rosana Hoes, [facility] Triage Specialist

## 2014-03-15 NOTE — ED Notes (Signed)
Returned from  X-ray

## 2014-03-15 NOTE — ED Notes (Signed)
Pt reports he is homeless, living on the street.  Is alcoholic, last drank at [id number].  Had MI 10/12/2013 in [place], had stent placed.  He is here tonight "basically because the alcohol is number one reason."  Reports he also wants help with plavix and asa, etc.  Also report hx of depression, anxiety.  Denies suicidal ideation "I don't have the balls to do it."

## 2014-03-15 NOTE — Discharge Instructions (Signed)
Do not use alcohol, and never drink and drive.  Use resource guide provided for community resources for alcohol/substance abuse.  Follow up with primary care doctor in coming week. For recent chest pain, follow up with cardiologist in the next 1-2 weeks - see referral - call office Monday to arrange follow up appointment. Return to ER if worse, persistent or recurrent chest pain, trouble breathing, severe depression, other concern.    Alcohol Use Disorder Alcohol use disorder is a mental disorder. It is not a one-time incident of heavy drinking. Alcohol use disorder is the excessive and uncontrollable use of alcohol over time that leads to problems with functioning in one or more areas of daily living. People with this disorder risk harming themselves and others when they drink to excess. Alcohol use disorder also can cause other mental disorders, such as mood and anxiety disorders, and serious physical problems. People with alcohol use disorder often misuse other drugs.  Alcohol use disorder is common and widespread. Some people with this disorder drink alcohol to cope with or escape from negative life events. Others drink to relieve chronic pain or symptoms of mental illness. People with a family history of alcohol use disorder are at higher risk of losing control and using alcohol to excess.  SYMPTOMS  Signs and symptoms of alcohol use disorder may include the following:   Consumption ofalcohol inlarger amounts or over a longer period of time than intended.  Multiple unsuccessful attempts to cutdown or control alcohol use.   A great deal of time spent obtaining alcohol, using alcohol, or recovering from the effects of alcohol (hangover).  A strong desire or urge to use alcohol (cravings).   Continued use of alcohol despite problems at work, school, or home because of alcohol use.   Continued use of alcohol despite problems in relationships because of alcohol use.  Continued use of  alcohol in situations when it is physically hazardous, such as driving a car.  Continued use of alcohol despite awareness of a physical or psychological problem that is likely related to alcohol use. Physical problems related to alcohol use can involve the brain, heart, liver, stomach, and intestines. Psychological problems related to alcohol use include intoxication, depression, anxiety, psychosis, delirium, and dementia.   The need for increased amounts of alcohol to achieve the same desired effect, or a decreased effect from the consumption of the same amount of alcohol (tolerance).  Withdrawal symptoms upon reducing or stopping alcohol use, or alcohol use to reduce or avoid withdrawal symptoms. Withdrawal symptoms include:  Racing heart.  Hand tremor.  Difficulty sleeping.  Nausea.  Vomiting.  Hallucinations.  Restlessness.  Seizures. DIAGNOSIS Alcohol use disorder is diagnosed through an assessment by your caregiver. Your caregiver may start by asking three or four questions to screen for excessive or problematic alcohol use. To confirm a diagnosis of alcohol use disorder, at least two symptoms (see SYMPTOMS) must be present within a 38-month period. The severity of alcohol use disorder depends on the number of symptoms:  Mild two or three.  Moderate four or five.  Severe six or more. Your caregiver may perform a physical exam or use results from lab tests to see if you have physical problems resulting from alcohol use. Your caregiver may refer you to a mental health professional for evaluation. TREATMENT  Some people with alcohol use disorder are able to reduce their alcohol use to low-risk levels. Some people with alcohol use disorder need to quit drinking alcohol. When necessary, mental  health professionals with specialized training in substance use treatment can help. Your caregiver can help you decide how severe your alcohol use disorder is and what type of treatment you  need. The following forms of treatment are available:   Detoxification. Detoxification involves the use of prescription medication to prevent alcohol withdrawal symptoms in the first week after quitting. This is important for people with a history of symptoms of withdrawal and for heavy drinkers who are likely to have withdrawal symptoms. Alcohol withdrawal can be dangerous and, in severe cases, cause death. Detoxification is usually provided in a hospital or in-patient substance use treatment facility.  Counseling or talk therapy. Talk therapy is provided by substance use treatment counselors. It addresses the reasons people use alcohol and ways to keep them from drinking again. The goals of talk therapy are to help people with alcohol use disorder find healthy activities and ways to cope with life stress, to identify and avoid triggers for alcohol use, and to handle cravings, which can cause relapse.  Medication.Different medications can help treat alcohol use disorder through the following actions:  Decrease alcohol cravings.  Decrease the positive reward response felt from alcohol use.  Produce an uncomfortable physical reaction when alcohol is used (aversion therapy).  Support groups. Support groups are run by people who have quit drinking. They provide emotional support, advice, and guidance. These forms of treatment are often combined. Some people with alcohol use disorder benefit from intensive combination treatment provided by specialized substance use treatment centers. Both inpatient and outpatient treatment programs are available. Document Released: 01/19/2005 Document Revised: 08/14/2013 Document Reviewed: 03/21/2013 [facility] Patient Information 2014 [place].     Alcohol Intoxication Alcohol intoxication occurs when the amount of alcohol that a person has consumed impairs his or her ability to mentally and physically function. Alcohol directly impairs the normal chemical  activity of the brain. Drinking large amounts of alcohol can lead to changes in mental function and behavior, and it can cause many physical effects that can be harmful.  Alcohol intoxication can range in severity from mild to very severe. Various factors can affect the level of intoxication that occurs, such as the person's age, gender, weight, frequency of alcohol consumption, and the presence of other medical conditions (such as diabetes, seizures, or heart conditions). Dangerous levels of alcohol intoxication may occur when people drink large amounts of alcohol in a short period (binge drinking). Alcohol can also be especially dangerous when combined with certain prescription medicines or "recreational" drugs. SIGNS AND SYMPTOMS Some common signs and symptoms of mild alcohol intoxication include:  Loss of coordination.  Changes in mood and behavior.  Impaired judgment.  Slurred speech. As alcohol intoxication progresses to more severe levels, other signs and symptoms will appear. These may include:  Vomiting.  Confusion and impaired memory.  Slowed breathing.  Seizures.  Loss of consciousness. DIAGNOSIS  Your health care provider will take a medical history and perform a physical exam. You will be asked about the amount and type of alcohol you have consumed. Blood tests will be done to measure the concentration of alcohol in your blood. In many places, your blood alcohol level must be lower than 80 mg/dL (0.08%) to legally drive. However, many dangerous effects of alcohol can occur at much lower levels.  TREATMENT  People with alcohol intoxication often do not require treatment. Most of the effects of alcohol intoxication are temporary, and they go away as the alcohol naturally leaves the body. Your health care provider  will monitor your condition until you are stable enough to go home. Fluids are sometimes given through an IV access tube to help prevent dehydration.  HOME CARE  INSTRUCTIONS  Do not drive after drinking alcohol.  Stay hydrated. Drink enough water and fluids to keep your urine clear or pale yellow. Avoid caffeine.   Only take over-the-counter or prescription medicines as directed by your health care provider.  SEEK MEDICAL CARE IF:   You have persistent vomiting.   You do not feel better after a few days.  You have frequent alcohol intoxication. Your health care provider can help determine if you should see a substance use treatment counselor. SEEK IMMEDIATE MEDICAL CARE IF:   You become shaky or tremble when you try to stop drinking.   You shake uncontrollably (seizure).   You throw up (vomit) blood. This may be bright red or may look like black coffee grounds.   You have blood in your stool. This may be bright red or may appear as a black, tarry, bad smelling stool.   You become lightheaded or faint.  MAKE SURE YOU:   Understand these instructions.  Will watch your condition.  Will get help right away if you are not doing well or get worse. Document Released: 09/21/2005 Document Revised: 08/14/2013 Document Reviewed: 05/17/2013 [facility] Patient Information 2014 [place].    Chest Pain (Nonspecific) It is often hard to give a specific diagnosis for the cause of chest pain. There is always a chance that your pain could be related to something serious, such as a heart attack or a blood clot in the lungs. You need to follow up with your caregiver for further evaluation. CAUSES   Heartburn.  Pneumonia or bronchitis.  Anxiety or stress.  Inflammation around your heart (pericarditis) or lung (pleuritis or pleurisy).  A blood clot in the lung.  A collapsed lung (pneumothorax). It can develop suddenly on its own (spontaneous pneumothorax) or from injury (trauma) to the chest.  Shingles infection (herpes zoster virus). The chest wall is composed of bones, muscles, and cartilage. Any of these can be the source of  the pain.  The bones can be bruised by injury.  The muscles or cartilage can be strained by coughing or overwork.  The cartilage can be affected by inflammation and become sore (costochondritis). DIAGNOSIS  Lab tests or other studies, such as X-rays, electrocardiography, stress testing, or cardiac imaging, may be needed to find the cause of your pain.  TREATMENT   Treatment depends on what may be causing your chest pain. Treatment may include:  Acid blockers for heartburn.  Anti-inflammatory medicine.  Pain medicine for inflammatory conditions.  Antibiotics if an infection is present.  You may be advised to change lifestyle habits. This includes stopping smoking and avoiding alcohol, caffeine, and chocolate.  You may be advised to keep your head raised (elevated) when sleeping. This reduces the chance of acid going backward from your stomach into your esophagus.  Most of the time, nonspecific chest pain will improve within 2 to 3 days with rest and mild pain medicine. HOME CARE INSTRUCTIONS   If antibiotics were prescribed, take your antibiotics as directed. Finish them even if you start to feel better.  For the next few days, avoid physical activities that bring on chest pain. Continue physical activities as directed.  Do not smoke.  Avoid drinking alcohol.  Only take over-the-counter or prescription medicine for pain, discomfort, or fever as directed by your caregiver.  Follow your  caregiver's suggestions for further testing if your chest pain does not go away.  Keep any follow-up appointments you made. If you do not go to an appointment, you could develop lasting (chronic) problems with pain. If there is any problem keeping an appointment, you must call to reschedule. SEEK MEDICAL CARE IF:   You think you are having problems from the medicine you are taking. Read your medicine instructions carefully.  Your chest pain does not go away, even after treatment.  You  develop a rash with blisters on your chest. SEEK IMMEDIATE MEDICAL CARE IF:   You have increased chest pain or pain that spreads to your arm, neck, jaw, back, or abdomen.  You develop shortness of breath, an increasing cough, or you are coughing up blood.  You have severe back or abdominal pain, feel nauseous, or vomit.  You develop severe weakness, fainting, or chills.  You have a fever. THIS IS AN EMERGENCY. Do not wait to see if the pain will go away. Get medical help at once. Call your local emergency services (911 in U.S[place] Do not drive yourself to the hospital. MAKE SURE YOU:   Understand these instructions.  Will watch your condition.  Will get help right away if you are not doing well or get worse. Document Released: 09/21/2005 Document Revised: 03/05/2012 Document Reviewed: 07/17/2008 [facility] Patient Information 2014 [place].       Emergency Department Resource Guide 1) Find a Doctor and Pay Out of Pocket Although you won't have to find out who is covered by your insurance plan, it is a good idea to ask around and get recommendations. You will then need to call the office and see if the doctor you have chosen will accept you as a new patient and what types of options they offer for patients who are self-pay. Some doctors offer discounts or will set up payment plans for their patients who do not have insurance, but you will need to ask so you aren't surprised when you get to your appointment.  2) Contact Your Local Health Department Not all health departments have doctors that can see patients for sick visits, but many do, so it is worth a call to see if yours does. If you don't know where your local health department is, you can check in your phone book. The CDC also has a tool to help you locate your state's health department, and many state websites also have listings of all of their local health departments.  3) Find a [facility] If your illness is not  likely to be very severe or complicated, you may want to try a walk in clinic. These are popping up all over the country in pharmacies, drugstores, and shopping centers. They're usually staffed by nurse practitioners or physician assistants that have been trained to treat common illnesses and complaints. They're usually fairly quick and inexpensive. However, if you have serious medical issues or chronic medical problems, these are probably not your best option.  No Primary Care Doctor: - Call Health Connect at  [phone] - they can help you locate a primary care doctor that  accepts your insurance, provides certain services, etc. - Physician Referral Service- [phone]  Chronic Pain Problems: Organization         Address  Phone   Notes  [facility]  [phone] Patients need to be referred by their primary care doctor.   Medication Assistance: Organization  Address  Phone   Notes  [facility] Medication [facility] [address] [place], [place] [address] [phone] --Must be a resident of [facility] -- Must have NO insurance coverage whatsoever (no Medicaid/ Medicare, etc.) -- The pt. MUST have a primary care doctor that directs their care regularly and follows them in the community   [facility]ist  [phone]   [employer]  [phone]    Agencies that provide inexpensive medical care: Organization         Address  Phone   Notes  [facility]  [phone]   [facility]    [phone]   [facility] [place], [place] [address] [phone]   [place] [address] [place] [phone]   Planned Parenthood    [phone]   [facility]    [phone]   [place] and [place] [address], [place] Phone:  [phone], Fax:  [phone] Hours of Operation:  9 am - 6 pm,  M-F.  Also accepts Medicaid/Medicare and self-pay.  [facility] for [place], [address], [place] Phone: [phone], Fax: [phone]. Hours of Operation:  8:30 am - 5:30 pm, M-F.  Also accepts Medicaid and self-pay.  [facility] [place] [address] [place] Phone: [phone]   [place], [place], [place] [phone], Ext. [phone] Mondays & Thursdays: 7-9 AM.  First 15 patients are seen on a first come, first serve basis.    [place] Providers:  Organization         Address  Phone   Notes  [facility] [address], [place] [phone] Also accepts self-pay patients.  [facility] [address], [place]  [phone]   [place], [address], [place] [phone]   [facility] [address], [place] [phone]   Lucianne Lei [address], [place]   [phone] Only accepts [place] Access [place] patients after they have their name applied to their card.   Self-Pay (no insurance) in [facility]:  Organization         Address  Phone   Notes  Sickle Cell Patients, [facility] [place] [phone]   [facility] [place] [phone]   [facility] [place]  [place], [place], [place] [phone]   [facility]/Dr. Osei-Bonsu  [address], [place] or [address], [place] [phone] Phone number for both [place] and [place] locations is the same.  Urgent Medical and [facility] [address], [place] [phone]   [facility] [address] [place] or [address] [phone]   [facility] [address], [place] [phone], phone; [phone], fax Sees patients 1st and 3rd Saturday of every month.  Must not qualify for public or private insurance (i.e. Medicaid, Medicare, [place] Health Choice, Veterans' Benefits)  Household income should be no more than 200% of the poverty level The clinic cannot treat you if you are pregnant or think you  are pregnant  Sexually transmitted diseases are not treated at the clinic.    Dental Care: Organization         Address  Phone  Notes  [facility] [place] [phone] Accepts children up to age 58 who are enrolled in [place] or [place]; pregnant women with a Medicaid card; and children who have applied for Medicaid or [place] Health Choice, but were declined, whose parents can pay a reduced fee at time of service.  [facility] of [facility]  [address], [place] [phone] Accepts children up to age 69 who are enrolled in [place] or [place]; pregnant women with a Medicaid card; and children who have applied for Medicaid or [place] Health Choice, but were declined, whose parents can pay a reduced fee at time of service.  [place] [facility] Access PRO[facility]  [place] [phone] Patients are seen by appointment only. Walk-ins are not accepted. [place] will see patients 23 years of age and older. Monday - Tuesday (8am-5pm) Most Wednesdays (8:30-5pm) $30 per visit, cash only  [facility] Adult [facility] Access PROGRAM  [address], [facility] [phone] Patients are seen by appointment only. Walk-ins are not accepted. [place] will see patients 39 years of age and older. One Wednesday Evening (Monthly: Volunteer Based).  $30 per visit, cash only  [place]  [phone] for adults; Children under age 56, call Graduate Pediatric Dentistry at [phone]. Children aged 15-14, please call [phone] to request a pediatric application.  Dental services are provided in all areas of dental care including fillings, crowns and bridges, complete and partial dentures, implants, gum treatment, root canals, and extractions. Preventive care is also provided. Treatment is provided to both adults and children. Patients are selected via a lottery and there is often a waiting list.   [facility] [address] [place]  [phone] [email or web address]   [facility] [address] [place], [place] [phone], Ext. [phone] Second and Fourth Thursday of each month, opens at 6:30 AM; Clinic ends at 9 AM.  Patients are seen on a first-come first-served basis, and a limited number are seen during each clinic.   [facility]  [address] Hillard Danker [place], [place] [phone]   Eligibility Requirements You must have lived in [place], [place], or [place] counties for at least the last three months.   You cannot be eligible for state or federal sponsored [employer], including [employer], [place], or [employer].   You generally cannot be eligible for healthcare insurance through your employer.    How to apply: Eligibility screenings are held every Tuesday and Wednesday afternoon from 1:00 pm until 4:00 pm. You do not need an appointment for the interview!  [facility] [address] [place], [place]   [place]  [place] Department  [place]  [phone]    Behavioral Health Resources in the Community: Intensive Outpatient Programs Organization         Address  Phone  Notes  [place]. [address] [place], [place] [phone]   [facility] [address], [place], [place]   ADS: Alcohol & Drug Svcs  [address], [place], [place]   [place] [address]  [place], [place] or [phone]   Substance Abuse Resources Organization         Address  Phone  Notes  Alcohol and Drug Services  [phone]   [place]  [phone]   [facility] [place]   Chinita Pester  [phone]   Residential & Outpatient Substance Abuse Program  [phone]   Psychological Services Organization         Address  Phone  Notes  [facility]  [place]  [phone]   [place] [address], [place] or [phone]    Mobile Crisis Teams Organization         Address  Phone  Notes  [facility], Mobile Crisis Care Unit  [phone]   [facility]otherapeutic [facility]  [address] [place], [place]   [place] [address] [place] [phone]    Self-Help/Support Groups Organization         Address  Phone             Notes  [place]. of [place] - variety of support groups  [place] Call for more information  Narcotics Anonymous (NA), [facility] [address], [employer] [place]  2 meetings at this location   Special educational needs teacher         Address  Phone  Notes  [facility] [place],    [place]  [phone]   [facility]  [address], [place] [phone], [place], [place]   [place], [place] [phone] Admissions: 8am-3pm M-F  Incentives Substance [place] [address]    [place], [place] [phone]   [facility] [address] [place], [place], [place]   [facility] [address]  [place], [place]   [facility] - Intensive Outpatient [address] Kristeen [address], [place], [place]   [facility] [place].,  [place], [place] [phone] or [phone]   [facility] Services (RTS) [address] [place], [place] Accepts Medicaid  [facility] [place] [address],  [place] [phone] Substance Abuse/Addiction Treatment   [facility] Organization         Address  Phone  Notes  [facility]  [phone]   Domenic Schwab, PhD [address] Arlis Porta [place], [place]   [phone] or [phone]   [facility] [place], [place] [phone]   [facility] [address] [place], [place] [phone] Insurance/Medicaid/sponsorship through [facility] and Families [address]                                    [place], [place] [phone] [place] [address], [place] [phone]    Dr. Adele Schilder  [phone]   [facility] Dept. 1) [address] [place] 2) [place] 3)  [place] [address], [place] [phone]  [phone]   [place] [phone])  [phone] or [phone] (After Hours)    ° ° ° °
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
## 2014-03-15 NOTE — ED Notes (Signed)
Patient transported to X-ray 

## 2014-03-15 NOTE — BH Assessment (Signed)
Contacted [facility] and [facility]. Pt declined at both facilities due to Pt's reported history of DT's and withdrawal seizures. [facility] will contact other facilities for placement.  Orpah Greek Rosana Hoes, [facility] Triage Specialist

## 2014-03-15 NOTE — ED Notes (Addendum)
Pt arrived to triage via [facility] with intermittent L sided chest pain/aching since Thursday.  Also reports sob, nausea, vomiting, and pain radiating to back.  Pt reports smoking marijuana and "loud- killer weed" tonight.  Also requests detox from [facility].  Last [facility] less than 1 hour ago.  Denies suicidal ideation.

## 2014-03-15 NOTE — ED Provider Notes (Signed)
Pt resting comfortably.  No tremor or shakes. No diaphoresis. No nv.  Vital normal.  Pt exhibits normal mood and affect.  Expresses no thoughts to harm self or others.  Pt declined at [facility]/ [facility], and currently exhibiting no signs of [facility] withdrawal.  No chest pain or discomfort. Breathing comfortably.   Pt appears stable for d/c.  Will provide outpatient resource guide.     Mirna Mires, MD 03/15/14 [phone]

## 2014-03-15 NOTE — ED Notes (Signed)
Lunch tray ordered for pt.

## 2014-03-15 NOTE — BH Assessment (Signed)
Received called for assessment. Spoke with Dr. Noemi Chapel who said Pt presents requesting treatment for alcohol and marijuana use. He reported chest pain but is medically cleared. He reports no suicidal or homicidal ideation. Tele-assessment will be initiated.  Orpah Greek Rosana Hoes, [facility] Triage Specialist

## 2014-03-22 ENCOUNTER — Encounter (HOSPITAL_COMMUNITY): Payer: Self-pay | Admitting: Emergency Medicine

## 2014-03-22 ENCOUNTER — Emergency Department (HOSPITAL_COMMUNITY)
Admission: EM | Admit: 2014-03-22 | Discharge: 2014-03-22 | Disposition: A | Discharge status: Home / Self Care | Payer: Medicaid - Out of State | Attending: Emergency Medicine | Admitting: Emergency Medicine

## 2014-03-22 DIAGNOSIS — G8929 Other chronic pain: Secondary | ICD-10-CM | POA: Insufficient documentation

## 2014-03-22 DIAGNOSIS — Z8611 Personal history of tuberculosis: Secondary | ICD-10-CM | POA: Insufficient documentation

## 2014-03-22 DIAGNOSIS — F17200 Nicotine dependence, unspecified, uncomplicated: Secondary | ICD-10-CM | POA: Insufficient documentation

## 2014-03-22 DIAGNOSIS — I252 Old myocardial infarction: Secondary | ICD-10-CM | POA: Insufficient documentation

## 2014-03-22 DIAGNOSIS — Z7982 Long term (current) use of aspirin: Secondary | ICD-10-CM | POA: Insufficient documentation

## 2014-03-22 DIAGNOSIS — Z8619 Personal history of other infectious and parasitic diseases: Secondary | ICD-10-CM | POA: Insufficient documentation

## 2014-03-22 DIAGNOSIS — F1010 Alcohol abuse, uncomplicated: Secondary | ICD-10-CM

## 2014-03-22 DIAGNOSIS — F10929 Alcohol use, unspecified with intoxication, unspecified: Secondary | ICD-10-CM

## 2014-03-22 DIAGNOSIS — Z9861 Coronary angioplasty status: Secondary | ICD-10-CM | POA: Insufficient documentation

## 2014-03-22 DIAGNOSIS — F411 Generalized anxiety disorder: Secondary | ICD-10-CM | POA: Insufficient documentation

## 2014-03-22 NOTE — ED Notes (Signed)
Patient is alert and oriented x3.  He was given DC instructions and follow up visit instructions.  Patient gave verbal understanding.  He was DC ambulatory under his own power to home.  V/S stable.  He was not showing any signs of distress on DC 

## 2014-03-22 NOTE — ED Provider Notes (Signed)
Medical screening examination/treatment/procedure(s) were conducted as a shared visit with non-physician practitioner(s) and myself.  I personally evaluated the patient during the encounter.   EKG Interpretation None      Well appearing, awakens easily. Homeless. Dc home with outpatient drug and ETOH resources. No HI or SI  Hoy Morn, MD 03/22/14 [phone]

## 2014-03-22 NOTE — ED Notes (Signed)
Patient is alert and oriented x3.  He is complaining of psychological problems along with drugs and alcohol. He is requesting help to get into a program to help with his addiction.

## 2014-03-22 NOTE — Discharge Instructions (Signed)
°Emergency Department Resource Guide °1) Find a Doctor and Pay Out of Pocket °Although you won't have to find out who is covered by your insurance plan, it is a good idea to ask around and get recommendations. You will then need to call the office and see if the doctor you have chosen will accept you as a new patient and what types of options they offer for patients who are self-pay. Some doctors offer discounts or will set up payment plans for their patients who do not have insurance, but you will need to ask so you aren't surprised when you get to your appointment. ° °2) Contact Your Local Health Department °Not all health departments have doctors that can see patients for sick visits, but many do, so it is worth a call to see if yours does. If you don't know where your local health department is, you can check in your phone book. The CDC also has a tool to help you locate your state's health department, and many state websites also have listings of all of their local health departments. ° °3) Find a Walk-in Clinic °If your illness is not likely to be very severe or complicated, you may want to try a walk in clinic. These are popping up all over the country in pharmacies, drugstores, and shopping centers. They're usually staffed by nurse practitioners or physician assistants that have been trained to treat common illnesses and complaints. They're usually fairly quick and inexpensive. However, if you have serious medical issues or chronic medical problems, these are probably not your best option. ° °No Primary Care Doctor: °- Call [facility] at  832-8000 - they can help you locate a primary care doctor that  accepts your insurance, provides certain services, etc. °- Physician Referral Service- 1-800-533-3463 ° °Chronic Pain Problems: °Organization         Address  Phone   Notes  °[facility]  (336) 297-2271 Patients need to be referred by their primary care doctor.  ° °Medication  Assistance: °Organization         Address  Phone   Notes  °[place] [facility] [address] °[place], [place] [address] (336) 641-8030 --Must be a resident of [place] °-- Must have NO insurance coverage whatsoever (no Medicaid/ Medicare, etc.) °-- The pt. MUST have a primary care doctor that directs their care regularly and follows them in the community °  °[facility]  (866) 331-1348   °[employer]  (888) 892-1162   ° °Agencies that provide inexpensive medical care: °Organization         Address  Phone   Notes  °[facility]  (336) 832-8035   °[facility]    (336) 832-7272   °[facility] [address] °[place], [place] [address] (336) 832-4777   °[facility] [address], °[place] (336) 271-4999   °[facility]    (336) 373-0678   °[facility]    (336) 272-1050   °[facility] ° [address],  Phone:  (336) 832-4444, Fax:  (336) 832-4440 Hours of Operation:  9 am - 6 pm, M-F.  Also accepts Medicaid/Medicare and self-pay.  °[facility] ° [address],  Phone: (336) 832-3150, Fax: (336) 832-3151. Hours of Operation:  8:30 am - 5:30 pm, M-F.  Also accepts Medicaid and self-pay.  °[facility] [place] [address], [place] Phone: (336) 878-6027   °[facility] [address], [place], [place] (336)723-1848, Ext. 123 Mondays & Thursdays: 7-9 AM.  First 15 patients are seen on a first come, first serve basis. °  ° °Medicaid-accepting [place] Providers: ° °Organization         Address  Phone   Notes  °[facility] [address], [place] (336) 641-2100 Also accepts self-pay patients.  °[facility] [address], [place] ° (336) 856-9996   °[facility] [address], [place]  (336) 288-8857   °[facility] [address] [place] [address], [place] (336) 299-7000   °Veita Bland [address], [place]  ° (336) 373-1557 Only accepts [place] Access Medicaid patients after they have their name applied to their card.  ° °Self-Pay (no insurance) in [place]: ° °Organization         Address  Phone   Notes  °Sickle Cell Patients, [facility] [address], [place] (336) 832-1970   °[facility] [address], [place] (336) 832-4400   °[facility] [place] ° [address], [place] (336) 992-4800   °[facility]/Dr. Osei-Bonsu ° [address] [place] [address], [place] or [address], [place] (336) 841-8500 Phone number for both [place] and [place] locations is the same.  °[facility] [address], [place] (336) 299-0000   °[facility] [address] [place] [address], [place] or [address] (336) 852-7530 °(336) 878-2260   °[facility] [address], [place] (336) 350-1642, phone; (336) 294-5005, fax Sees patients 1st and 3rd Saturday of every month.  Must not qualify for public or private insurance (i.e. Medicaid, Medicare, [place] Health Choice, Veterans' Benefits) • Household income should be no more than 200% of the poverty level •The clinic cannot treat you if you are pregnant or think you are pregnant • Sexually transmitted diseases are not treated at the clinic.  ° ° °Dental Care: °Organization         Address  Phone  Notes  °[place] [facility] [address], [place] (336) 641-6152 Accepts children up to age 21 who are enrolled in Medicaid or [place] Health Choice; pregnant women with a Medicaid card; and children who have applied for Medicaid or [place] Health Choice, but were declined, whose parents can pay a reduced fee at time of service.  °[place]  [facility] [place]  [address], [place] (336) 641-7733 Accepts children up to age 21 who are enrolled in Medicaid or [place] Health Choice; pregnant women with a Medicaid card; and children who have applied for Medicaid or [place] Health Choice, but were declined, whose parents can pay a reduced fee at time of service.  °[facility] ° [address], [place] (336) 641-4533 Patients are seen by appointment only. Walk-ins are not accepted. [facility] will see patients 18 years of age and older. °Monday - Tuesday (8am-5pm) °Most Wednesdays (8:30-5pm) °$30 per visit, cash only  °[facility] ° [address], [place] (336) 641-4533 Patients are seen by appointment only. Walk-ins are not accepted. [facility] will see patients 18 years of age and older. °One   Wednesday Evening (Monthly: Volunteer Based).  $30 per visit, cash only  °[facility]  (919) 537-3737 for adults; Children under age 4, call Graduate Pediatric Dentistry at (919) [phone]. Children aged 4-14, please call (919) [phone] to request a pediatric application. ° Dental services are provided in all areas of dental care including fillings, crowns and bridges, complete and partial dentures, implants, gum treatment, root canals, and extractions. Preventive care is also provided. Treatment is provided to both adults and children. °Patients are selected via a lottery and there is often a waiting list. °  °[facility] [address], °[place] ° (336) 763-8833 [email or web address] °  °[facility] [address], [place], [place] (336)723-1848, Ext. 123 Second and Fourth Thursday of each month, opens at 6:30 AM; Clinic ends at 9 AM.  Patients are seen on a first-come first-served basis, and a limited number are seen during each clinic.  ° °[facility] ° [address], [place], [place] (336) 723-7904    Eligibility Requirements °You must have lived in [place], [place], or [place] counties for at least the last three months. °  You cannot be eligible for state or federal sponsored healthcare insurance, including Veterans Administration, Medicaid, or Medicare. °  You generally cannot be eligible for healthcare insurance through your employer.  °  How to apply: °Eligibility screenings are held every Tuesday and Wednesday afternoon from 1:00 pm until 4:00 pm. You do not need an appointment for the interview!  °[facility] [address], [place], [place] 336-631-2330   °[facility] [facility]  336-342-8273   °[facility]  336-703-3100   ° [facility]  336-570-6415   ° °Behavioral Health Resources in the Community: °Intensive Outpatient Programs °Organization         Address  Phone  Notes  °[facility] [address], [place], [place] 336-878-6098   °[facility] [address], [place], [place] 336-832-9800   °[facility] [address], [place], [place] ° 336-882-2125   °[facility] [address],  °[place], [place] 1-800-853-5163 or 336-641-4981   °Substance Abuse Resources °Organization         Address  Phone  Notes  °[facility]  336-882-2125   °[facility]  336-784-9470   °[facility]  336-285-9073   °[facility]  336-845-3988   °Residential & Outpatient Substance Abuse Program  1-800-659-3381   °Psychological Services °Organization         Address  Phone  Notes  °[facility]  336- 832-9600   °[facility]  336- 378-7881   °[facility] [address], [place] 1-800-853-5163 or 336-641-4981   ° °Mobile Crisis Teams °Organization         Address  Phone  Notes  °[facility], Mobile Crisis Care Unit  1-877-626-1772   °Assertive °[facility] ° [address]  [place],  336-834-9664   °Sharon DeEsch [address] °[place]  336-554-5454   ° °Self-Help/Support Groups °Organization         Address  Phone             Notes  °[facility]  - variety of support groups  336- 373-1402 Call for more information  °Narcotics Anonymous (NA), [facility] [address], °[place]   2 meetings at this location  ° °  Residential Treatment Programs °Organization         Address  Phone  Notes  °[facility] [address],    °[place]  1-866-801-8205   °[facility] ° [address], [place], [place] 704-293-8524   °[facility] [address], [place] 336-845-3988 Admissions: 8am-3pm M-F  °[facility] [address]    °[place], [place] 336-841-1104   °[facility] [address], [place], [place] 336-379-7146   °[facility] [address]  °[place], [place] 336-285-9073   °[facility] Intensive Outpatient [address], [place], [place] 336-852-3033   °[facility] [address]  °[place], [place] 1-877-615-2722 or 336-784-9470   °[facility] ([facility] [address] [place], [place] 336-227-7417 Accepts Medicaid  °[facility] [address]  °[place] 1-800-659-3381 Substance Abuse/Addiction Treatment  ° °[place] Behavioral Health Resources °Organization         Address  Phone  Notes  °[facility]  (888) 581-9988   °Julie Brannon, PhD [address] [place], [place]   (336) 349-5553 or (336) 951-0000   °[facility]   [address] °[place], [place] (336) 349-4454   °[facility] [address], [place], [place] (336) 342-8316 Insurance/Medicaid/sponsorship through [facility]  °[facility] [address]                                    [place], [place] (336) 342-8316 Therapy/tele-psych/case    °[facility] [address]  ° [place], [place] (336) 349-2233    °Dr. Arfeen  (336) 349-4544   °[facility] [place]  [employer] [place] [facility] 1) [address], [place] °2) [address], [place] °3)  [address], [place] (336) 349-3220 °(336) 342-7768 ° °(336) 342-8140   °[place] [facility] (336) 342-1394 or (336) 342-3537 (After Hours)    ° ° °

## 2014-03-22 NOTE — ED Provider Notes (Signed)
CSN: [id number]     Arrival date & time 03/22/14  0130 History   First MD Initiated Contact with Patient 03/22/14 0315     Chief Complaint  Patient presents with  . Alcohol Intoxication   HPI  History provided by the patient in recent medical charts. Patient is a 53 year old male with history of alcohol abuse, heroine abuse, hepatitis C who presents with request for help with drugs and alcohol. Patient was seen and evaluated for similar request one week ago. Patient was declined at [facility]. He admits to continued alcohol and drug use. He is a poor historian. Does not endorse any SI or HI. He has no other complaints. No reports of injury. No other aggravating or alleviating factors. No other associated symptoms.    Past Medical History  Diagnosis Date  . Hepatitis C   . Tuberculosis   . Myocardial infarction   . Anxiety   . Chronic pain   . Alcohol abuse   . History of heroin abuse    Past Surgical History  Procedure Laterality Date  . Coronary stent placement     History reviewed. No pertinent family history. History  Substance Use Topics  . Smoking status: Current Every Day Smoker  . Smokeless tobacco: Never Used     Comment: trying to quit  . Alcohol Use: Yes     Comment: pt had pint whiskey, 2 24 oz beers, 1 can of beer, more whiskey today. trying to quit    Review of Systems  Unable to perform ROS: Other      Allergies  Review of patient's allergies indicates no known allergies.  Home Medications   Current Outpatient Rx  Name  Route  Sig  Dispense  Refill  . aspirin 325 MG tablet   Oral   Take 325 mg by mouth daily.          BP 104/90  Pulse 119  Temp(Src) 99.4 F (37.4 C) (Oral)  Resp 20  SpO2 95% Physical Exam  Nursing note and vitals reviewed. Constitutional: He appears well-developed and well-nourished. No distress.  HENT:  Head: Normocephalic and atraumatic.  Cardiovascular: Normal rate and regular rhythm.   Pulmonary/Chest: Effort normal  and breath sounds normal. No respiratory distress. He has no wheezes.  Pt protecting airway  Abdominal: Soft.  Musculoskeletal: Normal range of motion. He exhibits no edema and no tenderness.  Neurological:  Patient is drowsy and falls asleep easily.  Skin: Skin is warm.  Psychiatric: He has a normal mood and affect.    ED Course  Procedures   DIAGNOSTIC STUDIES: Oxygen Saturation is 95% on room air.    COORDINATION OF CARE:  Nursing notes reviewed. Vital signs reviewed. Initial pt interview and examination performed.   3:55 AM-patient seen and evaluated. Patient sleeping drowsy to wake up. Does not contribute much to the history at this time. States he forgot why he came to the emergency room. Does admit to alcohol use. Has prior visits for the same. At this time suspect alcohol intoxication and I will plan to reassess.   5:30AM patient continues to be sleeping heavily. No other changes in his condition.  6:00AM Pt discussed in sign out with Dr. Venora Maples.  He will re-evaluate pt and make sure sober and stable for d/c home.    MDM   Final diagnoses:  Alcohol abuse  Alcohol intoxication         Martie Lee, [place] 03/22/14 [phone]

## 2014-03-24 ENCOUNTER — Emergency Department (HOSPITAL_COMMUNITY)
Admission: EM | Admit: 2014-03-24 | Discharge: 2014-03-24 | Disposition: A | Discharge status: Home / Self Care | Payer: Medicaid - Out of State

## 2014-03-24 NOTE — ED Notes (Signed)
Called for Norcia x 3 no response.

## 2014-03-24 NOTE — ED Notes (Addendum)
Pt called times 2 no response.

## 2014-03-25 ENCOUNTER — Encounter (HOSPITAL_COMMUNITY): Payer: Self-pay | Admitting: Emergency Medicine

## 2014-03-25 ENCOUNTER — Emergency Department (HOSPITAL_COMMUNITY)
Admission: EM | Admit: 2014-03-25 | Discharge: 2014-03-25 | Disposition: A | Discharge status: Home / Self Care | Payer: Medicaid - Out of State | Attending: Emergency Medicine | Admitting: Emergency Medicine

## 2014-03-25 DIAGNOSIS — Z7982 Long term (current) use of aspirin: Secondary | ICD-10-CM | POA: Insufficient documentation

## 2014-03-25 DIAGNOSIS — F329 Major depressive disorder, single episode, unspecified: Secondary | ICD-10-CM | POA: Insufficient documentation

## 2014-03-25 DIAGNOSIS — F10929 Alcohol use, unspecified with intoxication, unspecified: Secondary | ICD-10-CM

## 2014-03-25 DIAGNOSIS — F1010 Alcohol abuse, uncomplicated: Secondary | ICD-10-CM | POA: Insufficient documentation

## 2014-03-25 DIAGNOSIS — IMO0002 Reserved for concepts with insufficient information to code with codable children: Secondary | ICD-10-CM | POA: Insufficient documentation

## 2014-03-25 DIAGNOSIS — Z8611 Personal history of tuberculosis: Secondary | ICD-10-CM | POA: Insufficient documentation

## 2014-03-25 DIAGNOSIS — I252 Old myocardial infarction: Secondary | ICD-10-CM | POA: Insufficient documentation

## 2014-03-25 DIAGNOSIS — Z9861 Coronary angioplasty status: Secondary | ICD-10-CM | POA: Insufficient documentation

## 2014-03-25 DIAGNOSIS — Z8619 Personal history of other infectious and parasitic diseases: Secondary | ICD-10-CM | POA: Insufficient documentation

## 2014-03-25 DIAGNOSIS — G8929 Other chronic pain: Secondary | ICD-10-CM | POA: Insufficient documentation

## 2014-03-25 DIAGNOSIS — F17200 Nicotine dependence, unspecified, uncomplicated: Secondary | ICD-10-CM | POA: Insufficient documentation

## 2014-03-25 DIAGNOSIS — F3289 Other specified depressive episodes: Secondary | ICD-10-CM | POA: Insufficient documentation

## 2014-03-25 MED ORDER — LORAZEPAM 1 MG PO TABS
1.0000 mg | ORAL_TABLET | Freq: Once | ORAL | Status: AC
  Administered 2014-03-25: 1 mg via ORAL
  Filled 2014-03-25: qty 1

## 2014-03-25 NOTE — Discharge Instructions (Signed)
°Emergency Department Resource Guide °1) Find a Doctor and Pay Out of Pocket °Although you won't have to find out who is covered by your insurance plan, it is a good idea to ask around and get recommendations. You will then need to call the office and see if the doctor you have chosen will accept you as a new patient and what types of options they offer for patients who are self-pay. Some doctors offer discounts or will set up payment plans for their patients who do not have insurance, but you will need to ask so you aren't surprised when you get to your appointment. ° °2) Contact Your Local Health Department °Not all health departments have doctors that can see patients for sick visits, but many do, so it is worth a call to see if yours does. If you don't know where your local health department is, you can check in your phone book. The CDC also has a tool to help you locate your state's health department, and many state websites also have listings of all of their local health departments. ° °3) Find a Walk-in Clinic °If your illness is not likely to be very severe or complicated, you may want to try a walk in clinic. These are popping up all over the country in pharmacies, drugstores, and shopping centers. They're usually staffed by nurse practitioners or physician assistants that have been trained to treat common illnesses and complaints. They're usually fairly quick and inexpensive. However, if you have serious medical issues or chronic medical problems, these are probably not your best option. ° °No Primary Care Doctor: °- Call [facility] at  832-8000 - they can help you locate a primary care doctor that  accepts your insurance, provides certain services, etc. °- Physician Referral Service- 1-800-533-3463 ° °Chronic Pain Problems: °Organization         Address  Phone   Notes  °[facility]  (336) 297-2271 Patients need to be referred by their primary care doctor.  ° °Medication  Assistance: °Organization         Address  Phone   Notes  °[place] [facility] [address] °[place], [place] [address] (336) 641-8030 --Must be a resident of [place] °-- Must have NO insurance coverage whatsoever (no Medicaid/ Medicare, etc.) °-- The pt. MUST have a primary care doctor that directs their care regularly and follows them in the community °  °[facility]  (866) 331-1348   °[employer]  (888) 892-1162   ° °Agencies that provide inexpensive medical care: °Organization         Address  Phone   Notes  °[facility]  (336) 832-8035   °[facility]    (336) 832-7272   °[facility] [address] °[place], [place] [address] (336) 832-4777   °[facility] [address], °[place] (336) 271-4999   °[facility]    (336) 373-0678   °[facility]    (336) 272-1050   °[facility] ° [address], [place] Phone:  (336) 832-4444, Fax:  (336) 832-4440 Hours of Operation:  9 am - 6 pm, M-F.  Also accepts Medicaid/Medicare and self-pay.  °[facility] ° [address], [place] Phone: (336) 832-3150, Fax: (336) 832-3151. Hours of Operation:  8:30 am - 5:30 pm, M-F.  Also accepts Medicaid and self-pay.  °[facility] [place] [address], [place] Phone: (336) 878-6027   °[facility] [address], [place], [place] (336)723-1848, Ext. 123 Mondays & Thursdays: 7-9 AM.  First 15 patients are seen on a first come, first serve basis. °  ° °Medicaid-accepting [place] Providers: ° °Organization         Address  Phone   Notes  °[facility] [address], [place] (336) 641-2100 Also accepts self-pay patients.  °[facility] [address], [place] ° (336) 856-9996   °[facility] [address], [place]  (336) 288-8857   °[facility] [address] [place] [address], [place] (336) 299-7000   °Veita Bland [address], [place]  ° (336) 373-1557 Only accepts [place] Access Medicaid patients after they have their name applied to their card.  ° °Self-Pay (no insurance) in [place]: ° °Organization         Address  Phone   Notes  °Sickle Cell Patients, [facility] [address], [place] (336) 832-1970   °[facility] [address], [place] (336) 832-4400   °[facility] ° [address],  (336) 992-4800   °[facility]/Dr. Osei-Bonsu ° [address] [place] [address], [place] or [address], [place] (336) 841-8500 Phone number for both [place] and [place] locations is the same.  °[facility] [address], [place] (336) 299-0000   °[facility] [address] [place] [address], [place] or [address] (336) 852-7530 °(336) 878-2260   °[facility] [address], [place] (336) 350-1642, phone; (336) 294-5005, fax Sees patients 1st and 3rd Saturday of every month.  Must not qualify for public or private insurance (i.e. Medicaid, Medicare, [place] Health Choice, Veterans' Benefits) • Household income should be no more than 200% of the poverty level •The clinic cannot treat you if you are pregnant or think you are pregnant • Sexually transmitted diseases are not treated at the clinic.  ° ° °Dental Care: °Organization         Address  Phone  Notes  °[place] [facility] [address], [place] (336) 641-6152 Accepts children up to age 21 who are enrolled in Medicaid or [place] Health Choice; pregnant women with a Medicaid card; and children who have applied for Medicaid or [place] Health Choice, but were declined, whose parents can pay a reduced fee at time of service.  °[place]  [facility] [place]  [address], [place] (336) 641-7733 Accepts children up to age 21 who are enrolled in Medicaid or  Health Choice; pregnant women with a Medicaid card; and children who have applied for Medicaid or  Health Choice, but were declined, whose parents can pay a reduced fee at time of service.  °[facility] ° [address],  (336) 641-4533 Patients are seen by appointment only. Walk-ins are not accepted. [facility] will see patients 18 years of age and older. °Monday - Tuesday (8am-5pm) °Most Wednesdays (8:30-5pm) °$30 per visit, cash only  °[facility] ° [address], [place] (336) 641-4533 Patients are seen by appointment only. Walk-ins are not accepted. [facility] will see patients 18 years of age and older. °One   Wednesday Evening (Monthly: Volunteer Based).  $30 per visit, cash only  °[facility]  (919) 537-3737 for adults; Children under age 4, call Graduate Pediatric Dentistry at ([phone]) [phone]. Children aged 4-14, please call (919) [phone] to request a pediatric application. ° Dental services are provided in all areas of dental care including fillings, crowns and bridges, complete and partial dentures, implants, gum treatment, root canals, and extractions. Preventive care is also provided. Treatment is provided to both adults and children. °Patients are selected via a lottery and there is often a waiting list. °  °[facility] [address], °[place] ° (336) 763-8833 [email or web address] °  °[facility] [address], [place], [place] (336)723-1848, Ext. 123 Second and Fourth Thursday of each month, opens at 6:30 AM; Clinic ends at 9 AM.  Patients are seen on a first-come first-served basis, and a limited number are seen during each clinic.  ° °[facility] ° [address], [place], [place] (336) 723-7904    Eligibility Requirements °You must have lived in [place], or [place] counties for at least the last three months. °  You cannot be eligible for state or federal sponsored healthcare insurance, including Veterans Administration, Medicaid, or Medicare. °  You generally cannot be eligible for healthcare insurance through your employer.  °  How to apply: °Eligibility screenings are held every Tuesday and Wednesday afternoon from 1:00 pm until 4:00 pm. You do not need an appointment for the interview!  °[facility] [address], [place], [place] 336-631-2330   °[facility]  336-342-8273   °[facility]  336-703-3100   °[facility]  336-570-6415   ° °Behavioral Health Resources in the Community: °Intensive Outpatient Programs °Organization         Address  Phone  Notes  °[facility] [address], [place], [place] 336-878-6098   °[facility] [address], [place], [place] 336-832-9800   °[facility] [address], [place], [place] ° 336-882-2125   °[facility] [address],  °[place], [place] 1-800-853-5163 or 336-641-4981   °Substance Abuse Resources °Organization         Address  Phone  Notes  °[facility]  336-882-2125   °[facility]  336-784-9470   °[facility]  336-285-9073   °[facility]  336-845-3988   °Residential & Outpatient Substance Abuse Program  1-800-659-3381   °Psychological Services °Organization         Address  Phone  Notes  °[facility]  336- 832-9600   °[facility]  336- 378-7881   °[facility] [address], [place] 1-800-853-5163 or 336-641-4981   ° °Mobile Crisis Teams °Organization         Address  Phone  Notes  °[facility], Mobile Crisis Care Unit  1-877-626-1772   °Assertive °[facility] ° [address]  [place], [place] 336-834-9664   °Sharon DeEsch [address] °[place]  336-554-5454   ° °Self-Help/Support Groups °Organization         Address  Phone             Notes  °[facility] - variety of support groups  336- 373-1402 Call for more information  °Narcotics Anonymous (NA), [facility] [address], °[place]   2 meetings at this location  ° °  Residential Treatment Programs Organization         Address  Phone  Notes  [facility] [address],    [place]  1-612-396-9753   [facility]  [address] [place] [address], [place], [place]   [facility] [place], [place] 604-704-9592 Admissions: 8am-3pm M-F  [facility] [address],    [place], [place] 382-505-3976   [facility] [address] [place], [place], [place]   [facility] [address],  [place], [place]   [facility] - Intensive Outpatient [address], [place], [place]   [facility] ([place] [address]  [place], [place] 1-430 041 1087 or 317-495-1726   [facility] ([facility]) [address] [place], [place] Accepts Medicaid  [facility] [address],  [place] 1-671-621-1709 Substance Abuse/Addiction Treatment   [facility]         Address  Phone  Notes  [facility]  [phone]   Domenic Schwab, PhD [address] [place], [place]   412-873-5951 or 907-657-6058   [facility] [place], [place] (747) 352-1526   [facility] [address], [place], [place] 309-155-9152 Insurance/Medicaid/sponsorship through [facility] [address]                                    [place], [place] [phone] [facility] [address], [place] (320)[phone]    Dr. Adele Schilder  (515)[phone]   [facility] Dept. 1) [address] [place] 2) [place] 3)  [address], [place] 843-590-5348 (312) [phone]  513-582-3874   [place] 3640497695 or (609)[phone] (After Hours)       Finding Treatment for Alcohol and Drug Addiction It can be hard to find the right place to get professional treatment. Here are some important things to consider:  There are different types of treatment to choose from.  Some programs are live-in (residential) while others are not (outpatient). Sometimes a combination is offered.  No single type of program is right for everyone.  Most treatment programs involve a combination of education, counseling, and a 12-step, spiritually-based approach.  There are non-spiritually based programs (not 12-step).  Some treatment programs are government sponsored. They are geared for patients without private insurance.  Treatment programs can vary in many respects such as:  Cost and types of insurance accepted.  Types of on-site medical services offered.  Length of stay, setting, and size.  Overall philosophy of treatment. A person may need specialized treatment or have needs not addressed by all programs. For example, adolescents need treatment appropriate for their age. Other people have secondary disorders that must be managed as well. Secondary conditions can include mental illness, such as depression or diabetes. Often, a period of detoxification from alcohol or drugs is needed. This requires medical supervision and not all programs offer this. THINGS TO CONSIDER WHEN SELECTING A TREATMENT PROGRAM   Is the program certified by the appropriate government agency? Even private programs must be certified and employ certified  professionals.  Does  the program accept your insurance? If not, can a payment plan be set up?  Is the facility clean, organized, and well run? Do they allow you to speak with graduates who can share their treatment experience with you? Can you tour the facility? Can you meet with staff?  Does the program meet the full range of individual needs?  Does the treatment program address sexual orientation and physical disabilities? Do they provide age, gender, and culturally appropriate treatment services?  Is treatment available in languages other than English?  Is long-term aftercare support or guidance encouraged and provided?  Is assessment of an individual's treatment plan ongoing to ensure it meets changing needs?  Does the program use strategies to encourage reluctant patients to remain in treatment long enough to increase the likelihood of success?  Does the program offer counseling (individual or group) and other behavioral therapies?  Does the program offer medicine as part of the treatment regimen, if needed?  Is there ongoing monitoring of possible relapse? Is there a defined relapse prevention program? Are services or referrals offered to family members to ensure they understand addiction and the recovery process? This would help them support the recovering individual.  Are 12-step meetings held at the center or is transport available for patients to attend outside meetings? In countries outside of the [place] and [place], [employer], [employer] for contact information for services in your area. Document Released: 11/10/2005 Document Revised: 03/05/2012 Document Reviewed: 05/22/2008 [facility] Patient Information 2014 [place].

## 2014-03-25 NOTE — ED Notes (Signed)
Dr Tawnya Crook in to speak with patient; pt given a breakfast tray--states can't eat; resources given

## 2014-03-25 NOTE — ED Notes (Signed)
PA at bedside.  Pt asleep and uncooperative  Will not answer questions at this time

## 2014-03-25 NOTE — ED Notes (Signed)
MD at bedside. 

## 2014-03-25 NOTE — ED Notes (Signed)
Pt states he is here for detox from alcohol  Pt states he is going to kill himself if he does not get some help  Pt states he cannot come off it by himself

## 2014-03-25 NOTE — Progress Notes (Signed)
[facility] provided pt with a list of primary care resources, highlighting [facility].  °

## 2014-03-25 NOTE — ED Provider Notes (Signed)
CSN: [id number]     Arrival date & time 03/25/14  0001 History   First MD Initiated Contact with Patient 03/25/14 0016     Chief Complaint  Patient presents with  . Medical Clearance     (Consider location/radiation/quality/duration/timing/severity/associated sxs/prior Treatment) HPI Comments: 53 year old male presents to the emergency department today requesting detox from alcohol. Patient is a history of hepatitis C, chronic pain, alcohol abuse, and heroin abuse. Patient is visibly intoxicated, slurring his words, and very sleepy. Patient arousable to verbal and physical stimuli. He will answer some questions appropriately and then drift off to sleep. Patient endorses illicit drug use, but he cannot expand on which drugs he has used. When asked if patient wants to harm himself, he states "I have thought about it". He cannot expand on a suicidal plan. Patient without homicidal ideations. He has been seen 2 times recently for similar complaints. Patient has been denied by both [facility] and [facility].  The history is provided by the patient. No language interpreter was used.    Past Medical History  Diagnosis Date  . Hepatitis C   . Tuberculosis   . Myocardial infarction   . Anxiety   . Chronic pain   . Alcohol abuse   . History of heroin abuse    Past Surgical History  Procedure Laterality Date  . Coronary stent placement     History reviewed. No pertinent family history. History  Substance Use Topics  . Smoking status: Current Every Day Smoker  . Smokeless tobacco: Never Used     Comment: trying to quit  . Alcohol Use: Yes     Comment: pt had pint whiskey, 2 24 oz beers, 1 can of beer, more whiskey today. trying to quit    Review of Systems  Psychiatric/Behavioral: Positive for behavioral problems.       +intoxication  All other systems reviewed and are negative.     Allergies  Review of patient's allergies indicates no known allergies.  Home Medications   Current  Outpatient Rx  Name  Route  Sig  Dispense  Refill  . aspirin 325 MG tablet   Oral   Take 325 mg by mouth daily.          BP 121/78  Pulse 117  Temp(Src) 96.8 F (36 C) (Oral)  Resp 18  SpO2 96%  Physical Exam  Nursing note and vitals reviewed. Constitutional: He appears well-developed and well-nourished. No distress.  Speech is slurred, but goal oriented. Patient visibly intoxicated and sleepy. Arousable to verbal and physical stimuli. Irritable upon waking.  HENT:  Head: Normocephalic and atraumatic.  Eyes: Conjunctivae and EOM are normal. No scleral icterus.  Neck: Normal range of motion.  Pulmonary/Chest: Effort normal. No respiratory distress.  Musculoskeletal: Normal range of motion.  Neurological: He is alert.  Skin: Skin is warm and dry. No rash noted. He is not diaphoretic. No erythema. No pallor.  Psychiatric: His speech is slurred. He is agitated. He exhibits a depressed mood. He expresses no homicidal ideation. He expresses no suicidal plans and no homicidal plans.    ED Course  Procedures (including critical care time) Labs Review Labs Reviewed - No data to display Imaging Review No results found.   EKG Interpretation None      MDM   Final diagnoses:  Alcohol abuse  Alcohol intoxication    53 year old male presents for alcohol intoxication. He is requesting detox from alcohol. Patient has been seen twice in the past week for same.  He has been denied by [facility]. Patient clinically intoxicated on arrival. He has been able to sober in ED. Patient signed out to my attending; patient will be reevaluated by Dr. Reather Converse at end of shift today. Anticipate d/c with outpatient resources. Patient has been sleeping comfortably, in NAD, for entirety of ED stay.    Antonietta Breach, [place] 03/25/14 [phone]

## 2014-03-25 NOTE — ED Notes (Signed)
Pt resting quietly in room with eye closed

## 2014-03-26 NOTE — ED Provider Notes (Signed)
Medical screening examination/treatment/procedure(s) were conducted as a shared visit with non-physician practitioner(s) or resident and myself. I personally evaluated the patient during the encounter and agree with the findings and plan unless otherwise indicated.  I have personally reviewed any xrays and/ or EKG's with the provider and I agree with interpretation.  Patient with alcohol abuse, hepatitis C, chronic pain, marijuana and heroin abuse presents requesting detox from alcohol. Patient has been in detox in the past. His last drink was yesterday. He has mild shaking however no history of withdrawal seizures per patient. No other concerns at this time. Patient denies suicidal or homicidal ideation to me. Nurses note reports patient was going to kill himself if he did not get help however he was very intoxicated at that time. Plan to monitor until clinically sober and address suicidal ideation at that time. Exam mild dry mucous membranes mild tachycardia, supple neck, cranial nerves intact, moves extremities equal bilateral.  Signed out with plan for outpt fup once clinically sober.  Alcohol abuse   Mariea Clonts, MD 03/26/14 0800

## 2014-08-02 ENCOUNTER — Inpatient Hospital Stay (HOSPITAL_COMMUNITY)
Admission: EM | Admit: 2014-08-02 | Discharge: 2014-08-04 | DRG: 155 | Disposition: A | Discharge status: Home / Self Care | Payer: Medicaid - Out of State | Attending: General Surgery | Admitting: General Surgery

## 2014-08-02 ENCOUNTER — Emergency Department (HOSPITAL_COMMUNITY): Payer: Medicaid - Out of State

## 2014-08-02 ENCOUNTER — Emergency Department: Payer: Self-pay | Admitting: Emergency Medicine

## 2014-08-02 ENCOUNTER — Encounter (HOSPITAL_COMMUNITY): Payer: Self-pay | Admitting: Emergency Medicine

## 2014-08-02 DIAGNOSIS — S02402A Zygomatic fracture, unspecified, initial encounter for closed fracture: Secondary | ICD-10-CM

## 2014-08-02 DIAGNOSIS — I959 Hypotension, unspecified: Secondary | ICD-10-CM | POA: Diagnosis present

## 2014-08-02 DIAGNOSIS — S02400A Malar fracture unspecified, initial encounter for closed fracture: Secondary | ICD-10-CM | POA: Diagnosis present

## 2014-08-02 DIAGNOSIS — S0280XA Fracture of other specified skull and facial bones, unspecified side, initial encounter for closed fracture: Secondary | ICD-10-CM

## 2014-08-02 DIAGNOSIS — S1093XA Contusion of unspecified part of neck, initial encounter: Secondary | ICD-10-CM

## 2014-08-02 DIAGNOSIS — H11422 Conjunctival edema, left eye: Secondary | ICD-10-CM | POA: Diagnosis present

## 2014-08-02 DIAGNOSIS — H0520 Unspecified exophthalmos: Secondary | ICD-10-CM | POA: Diagnosis present

## 2014-08-02 DIAGNOSIS — H11429 Conjunctival edema, unspecified eye: Secondary | ICD-10-CM | POA: Diagnosis present

## 2014-08-02 DIAGNOSIS — D62 Acute posthemorrhagic anemia: Secondary | ICD-10-CM | POA: Diagnosis present

## 2014-08-02 DIAGNOSIS — S022XXA Fracture of nasal bones, initial encounter for closed fracture: Principal | ICD-10-CM | POA: Diagnosis present

## 2014-08-02 DIAGNOSIS — F1010 Alcohol abuse, uncomplicated: Secondary | ICD-10-CM | POA: Diagnosis present

## 2014-08-02 DIAGNOSIS — I252 Old myocardial infarction: Secondary | ICD-10-CM

## 2014-08-02 DIAGNOSIS — S21209A Unspecified open wound of unspecified back wall of thorax without penetration into thoracic cavity, initial encounter: Secondary | ICD-10-CM

## 2014-08-02 DIAGNOSIS — F17200 Nicotine dependence, unspecified, uncomplicated: Secondary | ICD-10-CM | POA: Diagnosis present

## 2014-08-02 DIAGNOSIS — Z9861 Coronary angioplasty status: Secondary | ICD-10-CM

## 2014-08-02 DIAGNOSIS — S0240DA Maxillary fracture, left side, initial encounter for closed fracture: Secondary | ICD-10-CM

## 2014-08-02 DIAGNOSIS — F1110 Opioid abuse, uncomplicated: Secondary | ICD-10-CM | POA: Diagnosis present

## 2014-08-02 DIAGNOSIS — Z5900 Homelessness unspecified: Secondary | ICD-10-CM | POA: Diagnosis not present

## 2014-08-02 DIAGNOSIS — Z23 Encounter for immunization: Secondary | ICD-10-CM | POA: Diagnosis not present

## 2014-08-02 DIAGNOSIS — S0003XA Contusion of scalp, initial encounter: Secondary | ICD-10-CM

## 2014-08-02 DIAGNOSIS — S0230XA Fracture of orbital floor, unspecified side, initial encounter for closed fracture: Secondary | ICD-10-CM

## 2014-08-02 DIAGNOSIS — IMO0002 Reserved for concepts with insufficient information to code with codable children: Secondary | ICD-10-CM

## 2014-08-02 DIAGNOSIS — I2510 Atherosclerotic heart disease of native coronary artery without angina pectoris: Secondary | ICD-10-CM | POA: Diagnosis present

## 2014-08-02 DIAGNOSIS — S21212A Laceration without foreign body of left back wall of thorax without penetration into thoracic cavity, initial encounter: Secondary | ICD-10-CM

## 2014-08-02 DIAGNOSIS — S21109A Unspecified open wound of unspecified front wall of thorax without penetration into thoracic cavity, initial encounter: Secondary | ICD-10-CM | POA: Diagnosis present

## 2014-08-02 DIAGNOSIS — B1920 Unspecified viral hepatitis C without hepatic coma: Secondary | ICD-10-CM | POA: Diagnosis present

## 2014-08-02 DIAGNOSIS — F1210 Cannabis abuse, uncomplicated: Secondary | ICD-10-CM | POA: Diagnosis present

## 2014-08-02 DIAGNOSIS — R600 Localized edema: Secondary | ICD-10-CM | POA: Diagnosis present

## 2014-08-02 DIAGNOSIS — S0083XA Contusion of other part of head, initial encounter: Secondary | ICD-10-CM | POA: Diagnosis present

## 2014-08-02 DIAGNOSIS — G8929 Other chronic pain: Secondary | ICD-10-CM | POA: Diagnosis present

## 2014-08-02 DIAGNOSIS — H1130 Conjunctival hemorrhage, unspecified eye: Secondary | ICD-10-CM | POA: Diagnosis present

## 2014-08-02 DIAGNOSIS — S0100XA Unspecified open wound of scalp, initial encounter: Secondary | ICD-10-CM

## 2014-08-02 DIAGNOSIS — S02401A Maxillary fracture, unspecified, initial encounter for closed fracture: Secondary | ICD-10-CM | POA: Diagnosis present

## 2014-08-02 LAB — COMPREHENSIVE METABOLIC PANEL
ALBUMIN: 2.7 g/dL — AB (ref 3.4–5.0)
ALK PHOS: 53 U/L
ALK PHOS: 56 U/L (ref 39–117)
ALT: 63 U/L
ALT: 51 U/L (ref 0–53)
ANION GAP: 14 (ref 5–15)
AST: 58 U/L — ABNORMAL HIGH (ref 0–37)
Albumin: 2.9 g/dL — ABNORMAL LOW (ref 3.5–5.2)
Anion Gap: 11 (ref 7–16)
BILIRUBIN TOTAL: 0.2 mg/dL — AB (ref 0.3–1.2)
BUN: 6 mg/dL — ABNORMAL LOW (ref 7–18)
BUN: 6 mg/dL (ref 6–23)
Bilirubin,Total: 0.3 mg/dL (ref 0.2–1.0)
CALCIUM: 6.9 mg/dL — AB (ref 8.5–10.1)
CHLORIDE: 108 meq/L (ref 96–112)
CO2: 20 mEq/L (ref 19–32)
CREATININE: 0.92 mg/dL (ref 0.60–1.30)
Calcium: 6.7 mg/dL — ABNORMAL LOW (ref 8.4–10.5)
Chloride: 113 mmol/L — ABNORMAL HIGH (ref 98–107)
Co2: 21 mmol/L (ref 21–32)
Creatinine, Ser: 0.86 mg/dL (ref 0.50–1.35)
EGFR (Non-African Amer.): >60
GFR calc Af Amer: >90 mL/min (ref >90)
GFR calc non Af Amer: >90 mL/min (ref >90)
Glucose, Bld: 138 mg/dL — ABNORMAL HIGH (ref 70–99)
Glucose: 120 mg/dL — ABNORMAL HIGH (ref 65–99)
Osmolality: 288 (ref 275–301)
POTASSIUM: 4 meq/L (ref 3.7–5.3)
Potassium: 3.5 mmol/L (ref 3.5–5.1)
SGOT(AST): 64 U/L — ABNORMAL HIGH (ref 15–37)
Sodium: 145 mmol/L (ref 136–145)
Sodium: 142 mEq/L (ref 137–147)
Total Protein: 5.7 g/dL — ABNORMAL LOW (ref 6.4–8.2)
Total Protein: 5.5 g/dL — ABNORMAL LOW (ref 6.0–8.3)

## 2014-08-02 LAB — URINALYSIS, ROUTINE W REFLEX MICROSCOPIC
Bilirubin Urine: NEGATIVE
GLUCOSE, UA: 250 mg/dL — AB
Hgb urine dipstick: NEGATIVE
Ketones, ur: NEGATIVE mg/dL
Leukocytes, UA: NEGATIVE
Nitrite: NEGATIVE
PH: 5 (ref 5.0–8.0)
Protein, ur: NEGATIVE mg/dL
SPECIFIC GRAVITY, URINE: 1.029 (ref 1.005–1.030)
Urobilinogen, UA: 0.2 mg/dL (ref 0.0–1.0)

## 2014-08-02 LAB — LIPASE, BLOOD: Lipase: 151 U/L (ref 73–393)

## 2014-08-02 LAB — ETHANOL
Alcohol, Ethyl (B): 260 mg/dL — ABNORMAL HIGH (ref 0–11)
ETHANOL %: 0.303 % — AB (ref 0.000–0.080)
ETHANOL LVL: 303 mg/dL — AB

## 2014-08-02 LAB — CBC WITH DIFFERENTIAL/PLATELET
BASOS ABS: 0.1 10*3/uL (ref 0.0–0.1)
BASOS PCT: 0.6 %
Eosinophil #: 0.1 10*3/uL (ref 0.0–0.7)
Eosinophil %: 0.5 %
HCT: 42.6 % (ref 40.0–52.0)
HGB: 13.6 g/dL (ref 13.0–18.0)
LYMPHS PCT: 9.6 %
Lymphocyte #: 2 10*3/uL (ref 1.0–3.6)
MCH: 31.4 pg (ref 26.0–34.0)
MCHC: 32 g/dL (ref 32.0–36.0)
MCV: 98 fL (ref 80–100)
MONOS PCT: 4.6 %
Monocyte #: 1 x10 3/mm (ref 0.2–1.0)
NEUTROS ABS: 17.9 10*3/uL — AB (ref 1.4–6.5)
Neutrophil %: 84.7 %
PLATELETS: 204 10*3/uL (ref 150–440)
RBC: 4.34 10*6/uL — ABNORMAL LOW (ref 4.40–5.90)
RDW: 14.7 % — AB (ref 11.5–14.5)
WBC: 21.1 10*3/uL — ABNORMAL HIGH (ref 3.8–10.6)

## 2014-08-02 LAB — CBC
HCT: 39.5 % (ref 39.0–52.0)
Hemoglobin: 12.9 g/dL — ABNORMAL LOW (ref 13.0–17.0)
MCH: 31.4 pg (ref 26.0–34.0)
MCHC: 32.7 g/dL (ref 30.0–36.0)
MCV: 96.1 fL (ref 78.0–100.0)
Platelets: 170 10*3/uL (ref 150–400)
RBC: 4.11 MIL/uL — ABNORMAL LOW (ref 4.22–5.81)
RDW: 14.2 % (ref 11.5–15.5)
WBC: 13.2 10*3/uL — ABNORMAL HIGH (ref 4.0–10.5)

## 2014-08-02 LAB — SAMPLE TO BLOOD BANK

## 2014-08-02 LAB — MRSA PCR SCREENING: MRSA by PCR: NEGATIVE

## 2014-08-02 LAB — PROTIME-INR
INR: 1.43 (ref 0.00–1.49)
PROTHROMBIN TIME: 17.5 s — AB (ref 11.6–15.2)

## 2014-08-02 LAB — CDS SEROLOGY

## 2014-08-02 MED ORDER — LIDOCAINE HCL (PF) 1 % IJ SOLN: 30.0000 mL | Freq: Once | INTRAMUSCULAR | Status: DC

## 2014-08-02 MED ORDER — DEXMEDETOMIDINE HCL IN NACL 200 MCG/50ML IV SOLN: 0.2000 ug/kg/h | INTRAVENOUS | Status: DC

## 2014-08-02 MED ORDER — MORPHINE SULFATE 2 MG/ML IJ SOLN
2.0000 mg | INTRAMUSCULAR | Status: DC | PRN
  Administered 2014-08-02 – 2014-08-03 (×10): 2 mg via INTRAVENOUS
  Filled 2014-08-02 (×10): qty 1

## 2014-08-02 MED ORDER — SODIUM CHLORIDE 0.9 % IV BOLUS (SEPSIS)
1000.0000 mL | Freq: Once | INTRAVENOUS | Status: AC
  Administered 2014-08-02: 1000 mL via INTRAVENOUS

## 2014-08-02 MED ORDER — LORAZEPAM 2 MG/ML IJ SOLN
1.0000 mg | INTRAMUSCULAR | Status: DC | PRN
  Administered 2014-08-03: 2 mg via INTRAVENOUS
  Filled 2014-08-02: qty 1

## 2014-08-02 MED ORDER — ONDANSETRON HCL 4 MG/2ML IJ SOLN
4.0000 mg | Freq: Four times a day (QID) | INTRAMUSCULAR | Status: DC | PRN
Start: 2014-08-02 — End: 2014-08-04

## 2014-08-02 MED ORDER — IOHEXOL 300 MG/ML  SOLN
100.0000 mL | Freq: Once | INTRAMUSCULAR | Status: AC | PRN
  Administered 2014-08-02: 100 mL via INTRAVENOUS

## 2014-08-02 MED ORDER — TETRACAINE HCL 0.5 % OP SOLN
1.0000 [drp] | Freq: Once | OPHTHALMIC | Status: DC
  Filled 2014-08-02: qty 2

## 2014-08-02 MED ORDER — ONDANSETRON HCL 4 MG PO TABS: 4.0000 mg | ORAL_TABLET | Freq: Four times a day (QID) | ORAL | Status: DC | PRN

## 2014-08-02 MED ORDER — DEXTROSE-NACL 5-0.9 % IV SOLN
INTRAVENOUS | Status: DC
  Administered 2014-08-02: 12:00:00 via INTRAVENOUS
  Administered 2014-08-02: 125 mL/h via INTRAVENOUS

## 2014-08-02 MED ORDER — ENOXAPARIN SODIUM 40 MG/0.4ML ~~LOC~~ SOLN
40.0000 mg | SUBCUTANEOUS | Status: DC
  Administered 2014-08-02 – 2014-08-03 (×2): 40 mg via SUBCUTANEOUS
  Filled 2014-08-02 (×4): qty 0.4

## 2014-08-02 MED ORDER — VITAMIN B-1 100 MG PO TABS
100.0000 mg | ORAL_TABLET | Freq: Every day | ORAL | Status: DC
  Administered 2014-08-03 – 2014-08-04 (×2): 100 mg via ORAL
  Filled 2014-08-02 (×2): qty 1

## 2014-08-02 MED ORDER — FLUORESCEIN SODIUM 1 MG OP STRP
ORAL_STRIP | OPHTHALMIC | Status: AC
  Filled 2014-08-02: qty 1

## 2014-08-02 MED ORDER — FOLIC ACID 1 MG PO TABS
1.0000 mg | ORAL_TABLET | Freq: Every day | ORAL | Status: DC
  Administered 2014-08-03 – 2014-08-04 (×2): 1 mg via ORAL
  Filled 2014-08-02 (×2): qty 1

## 2014-08-02 MED ORDER — CETYLPYRIDINIUM CHLORIDE 0.05 % MT LIQD
7.0000 mL | Freq: Two times a day (BID) | OROMUCOSAL | Status: DC
  Administered 2014-08-02 – 2014-08-03 (×4): 7 mL via OROMUCOSAL

## 2014-08-02 MED ORDER — TETANUS-DIPHTH-ACELL PERTUSSIS 5-2.5-18.5 LF-MCG/0.5 IM SUSP
0.5000 mL | Freq: Once | INTRAMUSCULAR | Status: AC
  Administered 2014-08-02: 0.5 mL via INTRAMUSCULAR
  Filled 2014-08-02: qty 0.5

## 2014-08-02 MED ORDER — ADULT MULTIVITAMIN W/MINERALS CH
1.0000 | ORAL_TABLET | Freq: Every day | ORAL | Status: DC
  Administered 2014-08-03 – 2014-08-04 (×2): 1 via ORAL
  Filled 2014-08-02 (×2): qty 1

## 2014-08-02 MED ORDER — DORZOLAMIDE HCL-TIMOLOL MAL 2-0.5 % OP SOLN
1.0000 [drp] | Freq: Two times a day (BID) | OPHTHALMIC | Status: DC
  Administered 2014-08-02 – 2014-08-03 (×4): 1 [drp] via OPHTHALMIC
  Filled 2014-08-02 (×2): qty 10

## 2014-08-02 MED ORDER — ACETAMINOPHEN 325 MG PO TABS: 650.0000 mg | ORAL_TABLET | ORAL | Status: DC | PRN
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
## 2014-08-02 NOTE — ED Notes (Signed)
Wound cleaned to the back of head and face , bleeding is controlled
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
## 2014-08-02 NOTE — H&P (Signed)
Agree with above, dont think wounds need to be closed, he can heal these by secondary intention.
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
## 2014-08-03 ENCOUNTER — Inpatient Hospital Stay (HOSPITAL_COMMUNITY): Payer: Medicaid - Out of State

## 2014-08-03 DIAGNOSIS — S060XAA Concussion with loss of consciousness status unknown, initial encounter: Secondary | ICD-10-CM

## 2014-08-03 DIAGNOSIS — S060X9A Concussion with loss of consciousness of unspecified duration, initial encounter: Secondary | ICD-10-CM

## 2014-08-03 DIAGNOSIS — S0180XA Unspecified open wound of other part of head, initial encounter: Secondary | ICD-10-CM

## 2014-08-03 LAB — BASIC METABOLIC PANEL
ANION GAP: 10 (ref 5–15)
BUN: 5 mg/dL — ABNORMAL LOW (ref 6–23)
CALCIUM: 7.4 mg/dL — AB (ref 8.4–10.5)
CHLORIDE: 101 meq/L (ref 96–112)
CO2: 25 mEq/L (ref 19–32)
CREATININE: 0.72 mg/dL (ref 0.50–1.35)
GFR calc non Af Amer: >90 mL/min (ref >90)
Glucose, Bld: 103 mg/dL — ABNORMAL HIGH (ref 70–99)
Potassium: 3.9 mEq/L (ref 3.7–5.3)
Sodium: 136 mEq/L — ABNORMAL LOW (ref 137–147)

## 2014-08-03 LAB — CBC
HCT: 35.2 % — ABNORMAL LOW (ref 39.0–52.0)
Hemoglobin: 11.7 g/dL — ABNORMAL LOW (ref 13.0–17.0)
MCH: 32.4 pg (ref 26.0–34.0)
MCHC: 33.2 g/dL (ref 30.0–36.0)
MCV: 97.5 fL (ref 78.0–100.0)
PLATELETS: 162 10*3/uL (ref 150–400)
RBC: 3.61 MIL/uL — ABNORMAL LOW (ref 4.22–5.81)
RDW: 14 % (ref 11.5–15.5)
WBC: 5.6 10*3/uL (ref 4.0–10.5)

## 2014-08-03 MED ORDER — HYDROCODONE-ACETAMINOPHEN 5-325 MG PO TABS
2.0000 | ORAL_TABLET | ORAL | Status: DC | PRN
  Administered 2014-08-03 – 2014-08-04 (×4): 2 via ORAL
  Filled 2014-08-03 (×4): qty 2

## 2014-08-03 MED ORDER — SODIUM CHLORIDE 0.9 % IJ SOLN: 3.0000 mL | INTRAMUSCULAR | Status: DC | PRN

## 2014-08-03 NOTE — Progress Notes (Signed)
Pt transported to radiology with RN for cervical flex/ex and returned to room without complication. Dr. Hulen Skains notified that no abnormality was noted. Cervical spine cleared and c-collar removed.

## 2014-08-03 NOTE — Progress Notes (Signed)
Clinical Education officer, museum (CSW) received call from front desk stating that family members were here at the hospital wanting the room number of the patient. CSW spoke with family members who reported that they drove here from [place] to visit patient. CSW provided no information on patient (HIPAA) and encouraged them to call other family members.   Blima Rich, [place] Weekend CSW [phone]
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
## 2014-08-03 NOTE — Progress Notes (Signed)
Pt given Ativan 2 mg IV as pt is having some tremors and is feeling slightly anxious.

## 2014-08-04 MED ORDER — HYDROCODONE-ACETAMINOPHEN 5-325 MG PO TABS: 1.0000 | ORAL_TABLET | ORAL | Status: DC | PRN

## 2014-08-04 MED ORDER — CLOPIDOGREL BISULFATE 75 MG PO TABS: 75.0000 mg | ORAL_TABLET | Freq: Every day | ORAL | Status: DC

## 2014-08-04 MED ORDER — ASPIRIN 81 MG PO CHEW: 81.0000 mg | CHEWABLE_TABLET | Freq: Every day | ORAL | Status: DC

## 2014-08-04 NOTE — Discharge Instructions (Signed)
No driving while taking hydrocodone.  Wash wounds daily in shower with soap and water. Do not soak. Apply antibiotic ointment (e.g. Neosporin) twice daily and as needed to keep moist. Cover with dry dressing.
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
## 2014-08-04 NOTE — Discharge Summary (Signed)
Darrell Bischoff, MD, MPH, FACS Trauma: [phone] General Surgery: [phone]  

## 2014-08-04 NOTE — Progress Notes (Signed)
D/C Olyn Landstrom, MD, MPH, FACS Trauma: [phone] General Surgery: [phone]  

## 2014-08-04 NOTE — Progress Notes (Signed)
SW consulted for clothes and bus pass items given to pt. Pt is homeless. Pt reported that he will discharge to [facility] in [place].  Pt given information on substance abuse. Pt to discharge today.   Adelina Mings [phone]

## 2014-08-04 NOTE — Progress Notes (Signed)
D/c instructions reviewed and patient demonstrated understanding through teach back.  D/c via ambulation to elevators and bus ticket given for travel.  Patient indicated that he will go to [place] meeting very soon.
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
## 2014-09-22 ENCOUNTER — Encounter (HOSPITAL_COMMUNITY): Payer: Self-pay | Admitting: Emergency Medicine

## 2014-09-22 ENCOUNTER — Observation Stay (HOSPITAL_COMMUNITY)
Admission: EM | Admit: 2014-09-22 | Discharge: 2014-09-23 | Disposition: A | Discharge status: Home / Self Care | Payer: Medicaid - Out of State | Attending: Family Medicine | Admitting: Family Medicine

## 2014-09-22 ENCOUNTER — Emergency Department (HOSPITAL_COMMUNITY): Payer: Medicaid - Out of State

## 2014-09-22 DIAGNOSIS — R079 Chest pain, unspecified: Secondary | ICD-10-CM | POA: Diagnosis present

## 2014-09-22 DIAGNOSIS — R110 Nausea: Secondary | ICD-10-CM | POA: Insufficient documentation

## 2014-09-22 DIAGNOSIS — R0602 Shortness of breath: Secondary | ICD-10-CM | POA: Diagnosis not present

## 2014-09-22 DIAGNOSIS — F17200 Nicotine dependence, unspecified, uncomplicated: Secondary | ICD-10-CM | POA: Diagnosis not present

## 2014-09-22 DIAGNOSIS — F1010 Alcohol abuse, uncomplicated: Secondary | ICD-10-CM | POA: Diagnosis present

## 2014-09-22 DIAGNOSIS — R61 Generalized hyperhidrosis: Secondary | ICD-10-CM | POA: Insufficient documentation

## 2014-09-22 DIAGNOSIS — Z8619 Personal history of other infectious and parasitic diseases: Secondary | ICD-10-CM | POA: Diagnosis not present

## 2014-09-22 DIAGNOSIS — R0789 Other chest pain: Secondary | ICD-10-CM | POA: Diagnosis not present

## 2014-09-22 DIAGNOSIS — F411 Generalized anxiety disorder: Secondary | ICD-10-CM | POA: Diagnosis not present

## 2014-09-22 DIAGNOSIS — G8929 Other chronic pain: Secondary | ICD-10-CM | POA: Insufficient documentation

## 2014-09-22 DIAGNOSIS — R51 Headache: Secondary | ICD-10-CM | POA: Diagnosis not present

## 2014-09-22 DIAGNOSIS — F10920 Alcohol use, unspecified with intoxication, uncomplicated: Secondary | ICD-10-CM

## 2014-09-22 DIAGNOSIS — I252 Old myocardial infarction: Secondary | ICD-10-CM | POA: Diagnosis not present

## 2014-09-22 DIAGNOSIS — M19019 Primary osteoarthritis, unspecified shoulder: Secondary | ICD-10-CM | POA: Insufficient documentation

## 2014-09-22 DIAGNOSIS — Z8611 Personal history of tuberculosis: Secondary | ICD-10-CM | POA: Insufficient documentation

## 2014-09-22 DIAGNOSIS — R768 Other specified abnormal immunological findings in serum: Secondary | ICD-10-CM | POA: Diagnosis present

## 2014-09-22 LAB — URINALYSIS, ROUTINE W REFLEX MICROSCOPIC
Bilirubin Urine: NEGATIVE
Glucose, UA: NEGATIVE mg/dL
Hgb urine dipstick: NEGATIVE
Ketones, ur: NEGATIVE mg/dL
Leukocytes, UA: NEGATIVE
Nitrite: NEGATIVE
Protein, ur: NEGATIVE mg/dL
Specific Gravity, Urine: <1.005 — ABNORMAL LOW (ref 1.005–1.030)
Urobilinogen, UA: 0.2 mg/dL (ref 0.0–1.0)
pH: 6 (ref 5.0–8.0)

## 2014-09-22 LAB — CBC
HCT: 47.9 % (ref 39.0–52.0)
Hemoglobin: 16.2 g/dL (ref 13.0–17.0)
MCH: 30.2 pg (ref 26.0–34.0)
MCHC: 33.8 g/dL (ref 30.0–36.0)
MCV: 89.4 fL (ref 78.0–100.0)
Platelets: 221 10*3/uL (ref 150–400)
RBC: 5.36 MIL/uL (ref 4.22–5.81)
RDW: 14.1 % (ref 11.5–15.5)
WBC: 9.6 10*3/uL (ref 4.0–10.5)

## 2014-09-22 LAB — COMPREHENSIVE METABOLIC PANEL
ALBUMIN: 4.5 g/dL (ref 3.5–5.2)
ALT: 58 U/L — ABNORMAL HIGH (ref 0–53)
ANION GAP: 14 (ref 5–15)
AST: 45 U/L — ABNORMAL HIGH (ref 0–37)
Alkaline Phosphatase: 71 U/L (ref 39–117)
BUN: 4 mg/dL — ABNORMAL LOW (ref 6–23)
CO2: 26 mEq/L (ref 19–32)
Calcium: 9.7 mg/dL (ref 8.4–10.5)
Chloride: 101 mEq/L (ref 96–112)
Creatinine, Ser: 0.89 mg/dL (ref 0.50–1.35)
GFR calc Af Amer: >90 mL/min (ref >90)
GFR calc non Af Amer: >90 mL/min (ref >90)
Glucose, Bld: 97 mg/dL (ref 70–99)
Potassium: 4 mEq/L (ref 3.7–5.3)
SODIUM: 141 meq/L (ref 137–147)
TOTAL PROTEIN: 8.8 g/dL — AB (ref 6.0–8.3)
Total Bilirubin: 0.3 mg/dL (ref 0.3–1.2)

## 2014-09-22 LAB — RAPID URINE DRUG SCREEN, HOSP PERFORMED
Amphetamines: NOT DETECTED
BARBITURATES: NOT DETECTED
Benzodiazepines: NOT DETECTED
COCAINE: NOT DETECTED
OPIATES: NOT DETECTED
TETRAHYDROCANNABINOL: NOT DETECTED

## 2014-09-22 LAB — TROPONIN I: Troponin I: <0.3 ng/mL

## 2014-09-22 LAB — ETHANOL: Alcohol, Ethyl (B): 180 mg/dL — ABNORMAL HIGH (ref 0–11)

## 2014-09-22 MED ORDER — ASPIRIN 81 MG PO CHEW
324.0000 mg | CHEWABLE_TABLET | Freq: Once | ORAL | Status: AC
  Administered 2014-09-22: 324 mg via ORAL
  Filled 2014-09-22: qty 4

## 2014-09-22 MED ORDER — ACETAMINOPHEN 325 MG PO TABS
650.0000 mg | ORAL_TABLET | Freq: Once | ORAL | Status: AC
  Administered 2014-09-22: 650 mg via ORAL
  Filled 2014-09-22: qty 2
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
## 2014-09-22 NOTE — ED Notes (Signed)
Patient states has been drinking heavily today and did some crack and developed chest pain.  Patient states he wants help for substance abuse (ETOH).

## 2014-09-22 NOTE — ED Notes (Signed)
Patient sitting up in bed eating crackers and watching TV.  Patient is to be admitted for further evaluation of chest pain.  Patient is A&O; skin w/d. Respirations even and unlabored; able to speak in complete sentences without difficulty.  Patient continues to state he wants help to get off alcohol.

## 2014-09-22 NOTE — ED Notes (Signed)
Patient given Tylenol per order; patient requesting Vicodin.  Explained to patient that due to consuming ETOH, it is not safe to give narcotics.
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
## 2014-09-23 ENCOUNTER — Encounter (HOSPITAL_COMMUNITY): Payer: Self-pay | Admitting: *Deleted

## 2014-09-23 DIAGNOSIS — R894 Abnormal immunological findings in specimens from other organs, systems and tissues: Secondary | ICD-10-CM

## 2014-09-23 DIAGNOSIS — F1010 Alcohol abuse, uncomplicated: Secondary | ICD-10-CM

## 2014-09-23 DIAGNOSIS — R079 Chest pain, unspecified: Secondary | ICD-10-CM

## 2014-09-23 DIAGNOSIS — F17200 Nicotine dependence, unspecified, uncomplicated: Secondary | ICD-10-CM

## 2014-09-23 LAB — CBC
HEMATOCRIT: 44.7 % (ref 39.0–52.0)
Hemoglobin: 14.7 g/dL (ref 13.0–17.0)
MCH: 29.5 pg (ref 26.0–34.0)
MCHC: 32.9 g/dL (ref 30.0–36.0)
MCV: 89.8 fL (ref 78.0–100.0)
Platelets: 229 10*3/uL (ref 150–400)
RBC: 4.98 MIL/uL (ref 4.22–5.81)
RDW: 14.1 % (ref 11.5–15.5)
WBC: 5.3 10*3/uL (ref 4.0–10.5)

## 2014-09-23 LAB — COMPREHENSIVE METABOLIC PANEL
ALT: 46 U/L (ref 0–53)
ANION GAP: 14 (ref 5–15)
AST: 33 U/L (ref 0–37)
Albumin: 3.4 g/dL — ABNORMAL LOW (ref 3.5–5.2)
Alkaline Phosphatase: 60 U/L (ref 39–117)
BUN: 7 mg/dL (ref 6–23)
CALCIUM: 8.8 mg/dL (ref 8.4–10.5)
CO2: 24 meq/L (ref 19–32)
CREATININE: 0.91 mg/dL (ref 0.50–1.35)
Chloride: 107 mEq/L (ref 96–112)
Glucose, Bld: 98 mg/dL (ref 70–99)
Potassium: 4.1 mEq/L (ref 3.7–5.3)
SODIUM: 145 meq/L (ref 137–147)
TOTAL PROTEIN: 6.8 g/dL (ref 6.0–8.3)
Total Bilirubin: 0.2 mg/dL — ABNORMAL LOW (ref 0.3–1.2)

## 2014-09-23 LAB — TROPONIN I: Troponin I: <0.3 ng/mL (ref <0.30)

## 2014-09-23 MED ORDER — ASPIRIN EC 81 MG PO TBEC: 81.0000 mg | DELAYED_RELEASE_TABLET | Freq: Every day | ORAL | Status: DC

## 2014-09-23 MED ORDER — ENOXAPARIN SODIUM 40 MG/0.4ML ~~LOC~~ SOLN
40.0000 mg | SUBCUTANEOUS | Status: DC
  Administered 2014-09-23: 40 mg via SUBCUTANEOUS
  Filled 2014-09-23 (×2): qty 0.4

## 2014-09-23 MED ORDER — CLOPIDOGREL BISULFATE 75 MG PO TABS
75.0000 mg | ORAL_TABLET | Freq: Every day | ORAL | Status: DC
  Administered 2014-09-23: 75 mg via ORAL
  Filled 2014-09-23: qty 1

## 2014-09-23 MED ORDER — FOLIC ACID 1 MG PO TABS
1.0000 mg | ORAL_TABLET | Freq: Every day | ORAL | Status: DC
  Administered 2014-09-23: 1 mg via ORAL
  Filled 2014-09-23: qty 1

## 2014-09-23 MED ORDER — LORAZEPAM 1 MG PO TABS: 1.0000 mg | ORAL_TABLET | Freq: Four times a day (QID) | ORAL | Status: DC | PRN

## 2014-09-23 MED ORDER — ONDANSETRON HCL 4 MG/2ML IJ SOLN: 4.0000 mg | Freq: Four times a day (QID) | INTRAMUSCULAR | Status: DC | PRN

## 2014-09-23 MED ORDER — ADULT MULTIVITAMIN W/MINERALS CH
1.0000 | ORAL_TABLET | Freq: Every day | ORAL | Status: DC
  Administered 2014-09-23: 1 via ORAL
  Filled 2014-09-23: qty 1

## 2014-09-23 MED ORDER — ACETAMINOPHEN 325 MG PO TABS
650.0000 mg | ORAL_TABLET | Freq: Four times a day (QID) | ORAL | Status: DC | PRN
Start: 2014-09-23 — End: 2014-09-23

## 2014-09-23 MED ORDER — ASPIRIN 81 MG PO TBEC
81.0000 mg | DELAYED_RELEASE_TABLET | Freq: Every day | ORAL | Status: DC
Start: 2014-09-24 — End: 2015-05-08

## 2014-09-23 MED ORDER — CLOPIDOGREL BISULFATE 75 MG PO TABS: 75.0000 mg | ORAL_TABLET | Freq: Every day | ORAL | Status: DC

## 2014-09-23 MED ORDER — SODIUM CHLORIDE 0.9 % IJ SOLN
3.0000 mL | Freq: Two times a day (BID) | INTRAMUSCULAR | Status: DC
  Administered 2014-09-23 (×2): 3 mL via INTRAVENOUS

## 2014-09-23 MED ORDER — THIAMINE HCL 100 MG/ML IJ SOLN: 100.0000 mg | Freq: Every day | INTRAMUSCULAR | Status: DC

## 2014-09-23 MED ORDER — SODIUM CHLORIDE 0.9 % IJ SOLN: 3.0000 mL | INTRAMUSCULAR | Status: DC | PRN

## 2014-09-23 MED ORDER — SODIUM CHLORIDE 0.9 % IV SOLN: 250.0000 mL | INTRAVENOUS | Status: DC | PRN

## 2014-09-23 MED ORDER — ACETAMINOPHEN 650 MG RE SUPP: 650.0000 mg | Freq: Four times a day (QID) | RECTAL | Status: DC | PRN

## 2014-09-23 MED ORDER — VITAMIN B-1 100 MG PO TABS
100.0000 mg | ORAL_TABLET | Freq: Every day | ORAL | Status: DC
  Administered 2014-09-23: 100 mg via ORAL
  Filled 2014-09-23: qty 1

## 2014-09-23 MED ORDER — LORAZEPAM 2 MG/ML IJ SOLN: 1.0000 mg | Freq: Four times a day (QID) | INTRAMUSCULAR | Status: DC | PRN

## 2014-09-23 MED ORDER — ONDANSETRON HCL 4 MG PO TABS
4.0000 mg | ORAL_TABLET | Freq: Four times a day (QID) | ORAL | Status: DC | PRN
Start: 2014-09-23 — End: 2014-09-23

## 2014-09-23 NOTE — Progress Notes (Signed)
Patient discharged with instructions given on medications,and follow up visits,patient verbalized understanding.Prescriptions sent with patient.No c/o pain or discomfort noted. Staff accompanied patient to awaiting vehicle.

## 2014-09-23 NOTE — Clinical Social Work Psychosocial (Signed)
Clinical Social Work Department BRIEF PSYCHOSOCIAL ASSESSMENT 09/23/2014  Patient:  Darrell [name], Darrell [name]     Account Number:  [id number]     Admit date:  09/22/2014  Clinical Social Worker:  Wyatt Haste  Date/Time:  09/23/2014 12:14 PM  Referred by:  Physician  Date Referred:  09/23/2014 Referred for  Substance Abuse  Homelessness   Other Referral:   Interview type:  Patient Other interview type:    PSYCHOSOCIAL DATA Living Status:  ALONE Admitted from facility:   Level of care:   Primary support name:   Primary support relationship to patient:   Degree of support available:   very limited    CURRENT CONCERNS Current Concerns  Substance Abuse   Other Concerns:    SOCIAL WORK ASSESSMENT / PLAN CSW met with pt at bedside following referral from MD. Pt alert and oriented and reports he came to ED due to chest discomfort. Pt was released from jail yesterday and said he had been there since Labor Day. He reports yesterday he drank 3 40 oz beers and a 24 oz beer. He also states he smoked crack. Drug screen negative. Pt then began to have chest discomfort. He has a history of MI and EMS brought pt to ED. Pt has very limited support. He said he has an uncle who lives here in [place] and he is hopeful that he will help him, but otherwise has no one really involved. He was living in [place] and has been here for some time. However, he is waiting on some money to clear and then he plans to move to [place].    Pt has significant substance abuse history and reports he stopped using heroin about 6 years ago. He smokes marijuana as frequently as he can by his report. He "occasionally" uses crack. Prior to going to jail, pt indicates he drank about 1/2 gallon of beer a day, and some liquor. He started first thing in the morning and drank all day. Pt admits he has a problem with substance use and would like to "kill two birds with one stone" while here in the hospital by having chest pain  evaluated and also look at substance resources. He has been to [facility] in 2011. When asked what kind of treatment pt is looking for, he said he really wants to go to [facility]. CSW called them and no male availability. Pt aware and has decided this is the best place for him so he plans to call to be put on wait list. Pt to be d/c today. He intends to get in touch with his uncle to see if he can loan him some money and then he will figure out his next step after that. CSW provided pt with inpatient/outpatient substance abuse services list as well as shelters/transitional housing list for pt to make calls. He was appreciative of assistance and will make calls prior to d/c as well.   Assessment/plan status:  Referral to [employer] Other assessment/ plan:   Information/referral to community resources:   List of shelters/Transitional housing  Rethinking Drinking booklet  Effects of High Risk Drinking  Inpatient/outpatient substance abuse services  [employer] guide    PATIENT'S/FAMILY'S RESPONSE TO PLAN OF CARE: Pt to d/c today from hospital and has list of resources for shelters and substance abuse treatment.       Benay Pike, [place]

## 2014-09-23 NOTE — Progress Notes (Signed)
UR completed 
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
## 2014-09-23 NOTE — Progress Notes (Signed)
Patient's belongings returned to him by Vladimir Faster, who is a patient advocate,wallet was included with the materials returned to him,items were picked up from ED.

## 2014-09-23 NOTE — Discharge Summary (Signed)
Physician Discharge Summary  Darrell [name] SAY:[id number] DOB: 23-Feb-1961 DOA: 09/22/2014  PCP: No PCP Per Patient  Admit date: 09/22/2014 Discharge date: 09/23/2014  Time spent: 35 minutes  Recommendations for Outpatient Follow-up:  1. Outpatient follow up with cardiology scheduled  Discharge Diagnoses:  Active Problems:   Chest pain   Hepatitis C antibody test positive   Alcohol abuse   Discharge Condition: improved  Diet recommendation: low salt  Filed Weights   09/22/14 2152  Weight: 79.379 kg (175 lb)    History of present illness:  This is a 53 year old patient with history of coronary artery disease and myocardial infarction, reportedly in 09/2013 where he had cardiac stents placed in [place]. Patient reported that he was prescribed aspirin Plavix. For the past one month he's been unable to take any Plavix since he's been incarcerated. He was released from jail on the day prior to admission, and continued to smoke, drink alcohol, and was smoking crack. He subsequently developed substernal chest pain which lasted 30-40 minutes. He feels that taking aspirin and healthy. He's been admitted for further treatments  Hospital Course:  Patient's chest pain resolved after admission. Ruled out for ACS with negative cardiac markers. EKG was nonacute. Case was discussed with Dr. Domenic Polite and was felt appropriate that the patient could discharge and followup with cardiology as an outpatient to discuss further stress testing if needed. He was strongly advised to abstain from any further illicit drug use, alcohol or tobacco use. The patient is otherwise stable for discharge. His blood pressure is on the lower side and therefore he has not been prescribed beta blocker. This would also be contraindicated with his ongoing cocaine use. He was continued on aspirin Plavix.  Procedures:    Consultations:    Discharge Exam: Filed Vitals:   09/23/14 0655  BP: 100/42  Pulse: 77   Temp: 98.2 F (36.8 C)  Resp: 20    General: NAD Cardiovascular: S1, S2 RRR Respiratory: CTA B  Discharge Instructions You were cared for by a hospitalist during your hospital stay. If you have any questions about your discharge medications or the care you received while you were in the hospital after you are discharged, you can call the unit and asked to speak with the hospitalist on call if the hospitalist that took care of you is not available. Once you are discharged, your primary care physician will handle any further medical issues. Please note that NO REFILLS for any discharge medications will be authorized once you are discharged, as it is imperative that you return to your primary care physician (or establish a relationship with a primary care physician if you do not have one) for your aftercare needs so that they can reassess your need for medications and monitor your lab values.  Discharge Instructions   Call MD for:  difficulty breathing, headache or visual disturbances    Complete by:  As directed      Call MD for:  severe uncontrolled pain    Complete by:  As directed      Diet - low sodium heart healthy    Complete by:  As directed      Increase activity slowly    Complete by:  As directed           Current Discharge Medication List    START taking these medications   Details  aspirin EC 81 MG EC tablet Take 1 tablet (81 mg total) by mouth daily. Qty: 30 tablet, Refills: 1  clopidogrel (PLAVIX) 75 MG tablet Take 1 tablet (75 mg total) by mouth daily before breakfast. Qty: 30 tablet, Refills: 1       No Known Allergies Follow-up Information   Follow up with Arnoldo Lenis, MD On 09/26/2014. (9:20am)    Specialty:  Cardiology   Contact information:   [address] [place] [address] [phone]        The results of significant diagnostics from this hospitalization (including imaging, microbiology, ancillary and laboratory) are listed below  for reference.    Significant Diagnostic Studies: Dg Shoulder Right  09/22/2014   CLINICAL DATA:  Right shoulder pain post injury.  EXAM: RIGHT SHOULDER - 2+ VIEW  COMPARISON:  Chest x-ray 08/02/2014  FINDINGS: Exam demonstrates mild degenerative changes of the [facility] joint. There is no acute fracture or dislocation.  IMPRESSION: No acute findings.   Electronically Signed   By: Marin Olp M.D.   On: 09/22/2014 22:48   Dg Chest Portable 1 View  09/22/2014   CLINICAL DATA:  Chest pain.  Alcohol intoxication.  EXAM: PORTABLE CHEST - 1 VIEW  COMPARISON:  08/02/2014  FINDINGS: The heart size and mediastinal contours are within normal limits. Both lungs are clear. The visualized skeletal structures are unremarkable.  IMPRESSION: No active disease.   Electronically Signed   By: Lucienne Capers M.D.   On: 09/22/2014 22:27    Microbiology: No results found for this or any previous visit (from the past 240 hour(s)).   Labs: Basic Metabolic Panel:  Recent Labs Lab 09/22/14 2226 09/23/14 0648  NA 141 145  K 4.0 4.1  CL 101 107  CO2 26 24  GLUCOSE 97 98  BUN 4* 7  CREATININE 0.89 0.91  CALCIUM 9.7 8.8   Liver Function Tests:  Recent Labs Lab 09/22/14 2226 09/23/14 0648  AST 45* 33  ALT 58* 46  ALKPHOS 71 60  BILITOT 0.3 0.2*  PROT 8.8* 6.8  ALBUMIN 4.5 3.4*   No results found for this basename: LIPASE, AMYLASE,  in the last 168 hours No results found for this basename: AMMONIA,  in the last 168 hours CBC:  Recent Labs Lab 09/22/14 2226 09/23/14 0648  WBC 9.6 5.3  HGB 16.2 14.7  HCT 47.9 44.7  MCV 89.4 89.8  PLT 221 229   Cardiac Enzymes:  Recent Labs Lab 09/22/14 2226 09/23/14 0127 09/23/14 0648 09/23/14 1257  TROPONINI <0.30 <0.30 <0.30 <0.30   BNP: BNP (last 3 results)  Recent Labs  11/30/13 1534 03/15/14 0121  PROBNP 459.0* 254.6*   CBG: No results found for this basename: GLUCAP,  in the last 168 hours     Signed:  MEMON,JEHANZEB  [facility] 09/23/2014, 2:05 PM

## 2014-09-23 NOTE — ED Notes (Signed)
Dr. Darrick Meigs in room at this time.

## 2014-09-24 NOTE — Care Management Note (Signed)
    Page 1 of 1   09/24/2014     2:04:40 PM CARE MANAGEMENT NOTE 09/24/2014  Patient:  Darrell [name], Darrell [name]   Account Number:  [id number]  Date Initiated:  09/23/2014  Documentation initiated by:  Vladimir Creeks  Subjective/Objective Assessment:   Pt admitted with CP. He was released from prison/ jail yesterday and is not sure he has a place to go. CSW has seen pt and given him handouts for shelters, et. Pt [place] medicaid from out of state. Encouraged to get this changed to [place] soon,     Action/Plan:   but should cover medications. Pt very pleased with everyone's help here in the hospital   Anticipated DC Date:  09/23/2014   Anticipated DC Plan:  HOME/SELF CARE  In-house referral  Clinical Social Worker      DC Planning Services  CM consult      Choice offered to / List presented to:             Status of service:  Completed, signed off Medicare Important Message given?   (If response is "NO", the following Medicare IM given date fields will be blank) Date Medicare IM given:   Medicare IM given by:   Date Additional Medicare IM given:   Additional Medicare IM given by:    Discharge Disposition:  HOME/SELF CARE  Per UR Regulation:  Reviewed for med. necessity/level of care/duration of stay  If discussed at [place] of Stay Meetings, dates discussed:    Comments:  late entry 09/24/14 1400 for 09/23/14 1600 Vladimir Creeks RN/CM

## 2014-09-26 ENCOUNTER — Encounter: Payer: Medicaid - Out of State | Admitting: Cardiology

## 2014-09-26 ENCOUNTER — Encounter: Payer: Self-pay | Admitting: *Deleted

## 2014-09-26 NOTE — Progress Notes (Signed)
     Clinical Summary [name] is a 53 y.o.male seen today as a new patient for the following medical problems.  1. Chest pain - reported history of CAD with prior cath in [place], [place] Oct 2014, details unclear. He states he received a stent and was started on ASA and plavix, but has not been taking plavix since a recent incarceration.  - recent admit lated Sept 2015 with chest pain, apparently starting after using crack cocaine.. No evidence of ACS, ACS neg x 4. EKG NSR without ischemic changes. UDS + benzos, + THC, EtOH level 180. CXR no acute process   2. Polysubstance abuse - + tobacco, EtoH, cocaine   Past Medical History  Diagnosis Date  . Hepatitis C   . Tuberculosis   . Myocardial infarction   . Anxiety   . Chronic pain   . Alcohol abuse   . History of heroin abuse      No Known Allergies   Current Outpatient Prescriptions  Medication Sig Dispense Refill  . aspirin EC 81 MG EC tablet Take 1 tablet (81 mg total) by mouth daily.  30 tablet  1  . clopidogrel (PLAVIX) 75 MG tablet Take 1 tablet (75 mg total) by mouth daily before breakfast.  30 tablet  1   No current facility-administered medications for this visit.     Past Surgical History  Procedure Laterality Date  . Coronary stent placement       No Known Allergies    No family history on file.   Social History [name] reports that he has been smoking.  He has never used smokeless tobacco. [name] reports that he drinks alcohol.   Review of Systems CONSTITUTIONAL: No weight loss, fever, chills, weakness or fatigue.  HEENT: Eyes: No visual loss, blurred vision, double vision or yellow sclerae.No hearing loss, sneezing, congestion, runny nose or sore throat.  SKIN: No rash or itching.  CARDIOVASCULAR:  RESPIRATORY: No shortness of breath, cough or sputum.  GASTROINTESTINAL: No anorexia, nausea, vomiting or diarrhea. No abdominal pain or blood.  GENITOURINARY: No burning on  urination, no polyuria NEUROLOGICAL: No headache, dizziness, syncope, paralysis, ataxia, numbness or tingling in the extremities. No change in bowel or bladder control.  MUSCULOSKELETAL: No muscle, back pain, joint pain or stiffness.  LYMPHATICS: No enlarged nodes. No history of splenectomy.  PSYCHIATRIC: No history of depression or anxiety.  ENDOCRINOLOGIC: No reports of sweating, cold or heat intolerance. No polyuria or polydipsia.  Marland Kitchen   Physical Examination There were no vitals filed for this visit. There were no vitals filed for this visit.  Gen: resting comfortably, no acute distress HEENT: no scleral icterus, pupils equal round and reactive, no palptable cervical adenopathy,  CV Resp: Clear to auscultation bilaterally GI: abdomen is soft, non-tender, non-distended, normal bowel sounds, no hepatosplenomegaly MSK: extremities are warm, no edema.  Skin: warm, no rash Neuro:  no focal deficits Psych: appropriate affect   Diagnostic Studies     Assessment and Plan        Darrell [name], M.D., F.A.C.C.

## 2014-12-14 ENCOUNTER — Emergency Department: Payer: Self-pay | Admitting: Emergency Medicine

## 2014-12-14 LAB — COMPREHENSIVE METABOLIC PANEL
ALK PHOS: 84 U/L
ANION GAP: 7 (ref 7–16)
AST: 51 U/L — AB (ref 15–37)
Albumin: 3.8 g/dL (ref 3.4–5.0)
BILIRUBIN TOTAL: 0.4 mg/dL (ref 0.2–1.0)
BUN: 8 mg/dL (ref 7–18)
CO2: 25 mmol/L (ref 21–32)
CREATININE: 1.21 mg/dL (ref 0.60–1.30)
Calcium, Total: 8 mg/dL — ABNORMAL LOW (ref 8.5–10.1)
Chloride: 110 mmol/L — ABNORMAL HIGH (ref 98–107)
EGFR (African American): >60
Glucose: 90 mg/dL (ref 65–99)
Osmolality: 281 (ref 275–301)
Potassium: 3.9 mmol/L (ref 3.5–5.1)
SGPT (ALT): 59 U/L
Sodium: 142 mmol/L (ref 136–145)
Total Protein: 7.8 g/dL (ref 6.4–8.2)

## 2014-12-14 LAB — DRUG SCREEN, URINE
Amphetamines, Ur Screen: NEGATIVE (ref ?–1000)
Barbiturates, Ur Screen: NEGATIVE (ref ?–200)
Benzodiazepine, Ur Scrn: POSITIVE (ref ?–200)
CANNABINOID 50 NG, UR ~~LOC~~: NEGATIVE (ref ?–50)
COCAINE METABOLITE, UR ~~LOC~~: NEGATIVE (ref ?–300)
MDMA (Ecstasy)Ur Screen: NEGATIVE (ref ?–500)
Methadone, Ur Screen: NEGATIVE (ref ?–300)
Opiate, Ur Screen: NEGATIVE (ref ?–300)
Phencyclidine (PCP) Ur S: NEGATIVE (ref ?–25)
Tricyclic, Ur Screen: NEGATIVE (ref ?–1000)

## 2014-12-14 LAB — CBC
HCT: 47.4 % (ref 40.0–52.0)
HGB: 15.5 g/dL (ref 13.0–18.0)
MCH: 28.7 pg (ref 26.0–34.0)
MCHC: 32.7 g/dL (ref 32.0–36.0)
MCV: 88 fL (ref 80–100)
Platelet: 252 10*3/uL (ref 150–440)
RBC: 5.4 10*6/uL (ref 4.40–5.90)
RDW: 19.2 % — ABNORMAL HIGH (ref 11.5–14.5)
WBC: 9.5 10*3/uL (ref 3.8–10.6)

## 2014-12-14 LAB — TSH: Thyroid Stimulating Horm: 1.77 u[IU]/mL

## 2014-12-14 LAB — SALICYLATE LEVEL: SALICYLATES, SERUM: 1.9 mg/dL

## 2014-12-14 LAB — ETHANOL: Ethanol: 296 mg/dL

## 2014-12-14 LAB — ACETAMINOPHEN LEVEL

## 2014-12-16 ENCOUNTER — Emergency Department: Payer: Self-pay | Admitting: Emergency Medicine

## 2014-12-16 LAB — DRUG SCREEN, URINE
Amphetamines, Ur Screen: NEGATIVE (ref ?–1000)
BARBITURATES, UR SCREEN: NEGATIVE (ref ?–200)
BENZODIAZEPINE, UR SCRN: POSITIVE (ref ?–200)
COCAINE METABOLITE, UR ~~LOC~~: POSITIVE (ref ?–300)
Cannabinoid 50 Ng, Ur ~~LOC~~: NEGATIVE (ref ?–50)
MDMA (Ecstasy)Ur Screen: NEGATIVE (ref ?–500)
Methadone, Ur Screen: NEGATIVE (ref ?–300)
Opiate, Ur Screen: NEGATIVE (ref ?–300)
Phencyclidine (PCP) Ur S: NEGATIVE (ref ?–25)
Tricyclic, Ur Screen: NEGATIVE (ref ?–1000)

## 2014-12-16 LAB — CBC
HCT: 46.7 % (ref 40.0–52.0)
HGB: 15.2 g/dL (ref 13.0–18.0)
MCH: 28.9 pg (ref 26.0–34.0)
MCHC: 32.6 g/dL (ref 32.0–36.0)
MCV: 89 fL (ref 80–100)
Platelet: 242 10*3/uL (ref 150–440)
RBC: 5.26 10*6/uL (ref 4.40–5.90)
RDW: 18.5 % — ABNORMAL HIGH (ref 11.5–14.5)
WBC: 8.1 10*3/uL (ref 3.8–10.6)

## 2014-12-16 LAB — URINALYSIS, COMPLETE
BILIRUBIN, UR: NEGATIVE
BLOOD: NEGATIVE
Bacteria: NONE SEEN
Glucose,UR: NEGATIVE mg/dL (ref 0–75)
KETONE: NEGATIVE
LEUKOCYTE ESTERASE: NEGATIVE
Nitrite: NEGATIVE
PROTEIN: NEGATIVE
Ph: 5 (ref 4.5–8.0)
SPECIFIC GRAVITY: 1.003 (ref 1.003–1.030)

## 2014-12-16 LAB — COMPREHENSIVE METABOLIC PANEL
ALBUMIN: 3.9 g/dL (ref 3.4–5.0)
ALT: 58 U/L
AST: 36 U/L (ref 15–37)
Alkaline Phosphatase: 82 U/L
Anion Gap: 8 (ref 7–16)
BUN: 6 mg/dL — ABNORMAL LOW (ref 7–18)
Bilirubin,Total: 0.4 mg/dL (ref 0.2–1.0)
CO2: 22 mmol/L (ref 21–32)
CREATININE: 1 mg/dL (ref 0.60–1.30)
Calcium, Total: 8.4 mg/dL — ABNORMAL LOW (ref 8.5–10.1)
Chloride: 109 mmol/L — ABNORMAL HIGH (ref 98–107)
EGFR (African American): >60
EGFR (Non-African Amer.): >60
Glucose: 93 mg/dL (ref 65–99)
OSMOLALITY: 275 (ref 275–301)
POTASSIUM: 4 mmol/L (ref 3.5–5.1)
Sodium: 139 mmol/L (ref 136–145)
TOTAL PROTEIN: 8 g/dL (ref 6.4–8.2)

## 2014-12-16 LAB — ETHANOL: Ethanol: 251 mg/dL

## 2014-12-16 LAB — ACETAMINOPHEN LEVEL

## 2014-12-16 LAB — SALICYLATE LEVEL: Salicylates, Serum: 2.2 mg/dL

## 2015-01-08 ENCOUNTER — Emergency Department: Payer: Self-pay | Admitting: Emergency Medicine

## 2015-01-08 LAB — DRUG SCREEN, URINE
Amphetamines, Ur Screen: NEGATIVE (ref ?–1000)
BARBITURATES, UR SCREEN: NEGATIVE (ref ?–200)
Benzodiazepine, Ur Scrn: POSITIVE (ref ?–200)
Cannabinoid 50 Ng, Ur ~~LOC~~: POSITIVE (ref ?–50)
Cocaine Metabolite,Ur ~~LOC~~: NEGATIVE (ref ?–300)
MDMA (ECSTASY) UR SCREEN: NEGATIVE (ref ?–500)
METHADONE, UR SCREEN: NEGATIVE (ref ?–300)
Opiate, Ur Screen: NEGATIVE (ref ?–300)
PHENCYCLIDINE (PCP) UR S: NEGATIVE (ref ?–25)
Tricyclic, Ur Screen: NEGATIVE (ref ?–1000)

## 2015-01-08 LAB — COMPREHENSIVE METABOLIC PANEL
ANION GAP: 9 (ref 7–16)
Albumin: 3.7 g/dL (ref 3.4–5.0)
Alkaline Phosphatase: 92 U/L
BUN: 7 mg/dL (ref 7–18)
Bilirubin,Total: 0.2 mg/dL (ref 0.2–1.0)
CALCIUM: 8 mg/dL — AB (ref 8.5–10.1)
CHLORIDE: 107 mmol/L (ref 98–107)
Co2: 28 mmol/L (ref 21–32)
Creatinine: 1.1 mg/dL (ref 0.60–1.30)
EGFR (Non-African Amer.): >60
Glucose: 107 mg/dL — ABNORMAL HIGH (ref 65–99)
Osmolality: 285 (ref 275–301)
POTASSIUM: 3.7 mmol/L (ref 3.5–5.1)
SGOT(AST): 75 U/L — ABNORMAL HIGH (ref 15–37)
SGPT (ALT): 79 U/L — ABNORMAL HIGH
Sodium: 144 mmol/L (ref 136–145)
TOTAL PROTEIN: 8.1 g/dL (ref 6.4–8.2)

## 2015-01-08 LAB — URINALYSIS, COMPLETE
BACTERIA: NONE SEEN
BILIRUBIN, UR: NEGATIVE
BLOOD: NEGATIVE
GLUCOSE, UR: NEGATIVE mg/dL (ref 0–75)
Ketone: NEGATIVE
Leukocyte Esterase: NEGATIVE
Nitrite: NEGATIVE
Ph: 5 (ref 4.5–8.0)
Protein: NEGATIVE
RBC,UR: <1 /HPF (ref 0–5)
SQUAMOUS EPITHELIAL: NONE SEEN
Specific Gravity: 1.003 (ref 1.003–1.030)
WBC UR: <1 /HPF (ref 0–5)

## 2015-01-08 LAB — CBC
HCT: 46.1 % (ref 40.0–52.0)
HGB: 15.1 g/dL (ref 13.0–18.0)
MCH: 29.9 pg (ref 26.0–34.0)
MCHC: 32.9 g/dL (ref 32.0–36.0)
MCV: 91 fL (ref 80–100)
PLATELETS: 313 10*3/uL (ref 150–440)
RBC: 5.07 10*6/uL (ref 4.40–5.90)
RDW: 17.6 % — ABNORMAL HIGH (ref 11.5–14.5)
WBC: 6.6 10*3/uL (ref 3.8–10.6)

## 2015-01-08 LAB — ETHANOL: Ethanol: 304 mg/dL

## 2015-01-08 LAB — SALICYLATE LEVEL: SALICYLATES, SERUM: 2.6 mg/dL

## 2015-01-08 LAB — ACETAMINOPHEN LEVEL: Acetaminophen: <2 ug/mL

## 2015-01-10 ENCOUNTER — Emergency Department: Payer: Self-pay | Admitting: Emergency Medicine

## 2015-01-10 LAB — CBC
HCT: 48.3 % (ref 40.0–52.0)
HGB: 15.8 g/dL (ref 13.0–18.0)
MCH: 29.6 pg (ref 26.0–34.0)
MCHC: 32.8 g/dL (ref 32.0–36.0)
MCV: 90 fL (ref 80–100)
Platelet: 322 10*3/uL (ref 150–440)
RBC: 5.36 10*6/uL (ref 4.40–5.90)
RDW: 16.4 % — AB (ref 11.5–14.5)
WBC: 6.4 10*3/uL (ref 3.8–10.6)

## 2015-01-10 LAB — COMPREHENSIVE METABOLIC PANEL
ALK PHOS: 90 U/L
AST: 65 U/L — AB (ref 15–37)
Albumin: 3.7 g/dL (ref 3.4–5.0)
Anion Gap: 11 (ref 7–16)
BUN: 10 mg/dL (ref 7–18)
Bilirubin,Total: 0.4 mg/dL (ref 0.2–1.0)
CALCIUM: 7.9 mg/dL — AB (ref 8.5–10.1)
Chloride: 103 mmol/L (ref 98–107)
Co2: 22 mmol/L (ref 21–32)
Creatinine: 0.9 mg/dL (ref 0.60–1.30)
Glucose: 88 mg/dL (ref 65–99)
Osmolality: 270 (ref 275–301)
Potassium: 4 mmol/L (ref 3.5–5.1)
SGPT (ALT): 75 U/L — ABNORMAL HIGH
Sodium: 136 mmol/L (ref 136–145)
Total Protein: 8 g/dL (ref 6.4–8.2)

## 2015-01-10 LAB — SALICYLATE LEVEL: SALICYLATES, SERUM: 2.3 mg/dL

## 2015-01-10 LAB — ACETAMINOPHEN LEVEL: ACETAMINOPHEN: 32 ug/mL — AB

## 2015-01-10 LAB — ETHANOL: Ethanol: 212 mg/dL

## 2015-01-11 LAB — DRUG SCREEN, URINE
Amphetamines, Ur Screen: NEGATIVE (ref ?–1000)
BENZODIAZEPINE, UR SCRN: POSITIVE (ref ?–200)
Barbiturates, Ur Screen: NEGATIVE (ref ?–200)
COCAINE METABOLITE, UR ~~LOC~~: NEGATIVE (ref ?–300)
Cannabinoid 50 Ng, Ur ~~LOC~~: NEGATIVE (ref ?–50)
MDMA (ECSTASY) UR SCREEN: NEGATIVE (ref ?–500)
Methadone, Ur Screen: NEGATIVE (ref ?–300)
Opiate, Ur Screen: NEGATIVE (ref ?–300)
PHENCYCLIDINE (PCP) UR S: NEGATIVE (ref ?–25)
Tricyclic, Ur Screen: NEGATIVE (ref ?–1000)

## 2015-01-11 LAB — URINALYSIS, COMPLETE
Bacteria: NONE SEEN
Bilirubin,UR: NEGATIVE
Blood: NEGATIVE
Glucose,UR: NEGATIVE mg/dL (ref 0–75)
Ketone: NEGATIVE
LEUKOCYTE ESTERASE: NEGATIVE
NITRITE: NEGATIVE
PH: 6 (ref 4.5–8.0)
Protein: NEGATIVE
RBC,UR: <1 /HPF (ref 0–5)
Specific Gravity: 1.015 (ref 1.003–1.030)
Squamous Epithelial: NONE SEEN
WBC UR: 1 /HPF (ref 0–5)

## 2015-01-11 LAB — ACETAMINOPHEN LEVEL: Acetaminophen: 13 ug/mL

## 2015-01-15 ENCOUNTER — Emergency Department: Payer: Self-pay | Admitting: Emergency Medicine

## 2015-01-15 LAB — CBC
HCT: 50.9 % (ref 40.0–52.0)
HGB: 16.6 g/dL (ref 13.0–18.0)
MCH: 30.2 pg (ref 26.0–34.0)
MCHC: 32.7 g/dL (ref 32.0–36.0)
MCV: 92 fL (ref 80–100)
PLATELETS: 339 10*3/uL (ref 150–440)
RBC: 5.52 10*6/uL (ref 4.40–5.90)
RDW: 16.7 % — ABNORMAL HIGH (ref 11.5–14.5)
WBC: 9.9 10*3/uL (ref 3.8–10.6)

## 2015-01-15 LAB — BASIC METABOLIC PANEL
ANION GAP: 9 (ref 7–16)
BUN: 10 mg/dL (ref 7–18)
Calcium, Total: 8.2 mg/dL — ABNORMAL LOW (ref 8.5–10.1)
Chloride: 107 mmol/L (ref 98–107)
Co2: 23 mmol/L (ref 21–32)
Creatinine: 0.95 mg/dL (ref 0.60–1.30)
EGFR (African American): >60
EGFR (Non-African Amer.): >60
GLUCOSE: 82 mg/dL (ref 65–99)
OSMOLALITY: 276 (ref 275–301)
Potassium: 4.3 mmol/L (ref 3.5–5.1)
Sodium: 139 mmol/L (ref 136–145)

## 2015-01-15 LAB — TROPONIN I: Troponin-I: <0.02 ng/mL

## 2015-01-15 LAB — ETHANOL: Ethanol: 299 mg/dL

## 2015-01-18 ENCOUNTER — Emergency Department: Payer: Self-pay | Admitting: Emergency Medicine

## 2015-01-18 LAB — COMPREHENSIVE METABOLIC PANEL
ALBUMIN: 3.6 g/dL (ref 3.4–5.0)
ALK PHOS: 85 U/L
ANION GAP: 10 (ref 7–16)
BILIRUBIN TOTAL: 0.4 mg/dL (ref 0.2–1.0)
BUN: 7 mg/dL (ref 7–18)
CREATININE: 0.89 mg/dL (ref 0.60–1.30)
Calcium, Total: 8.5 mg/dL (ref 8.5–10.1)
Chloride: 104 mmol/L (ref 98–107)
Co2: 25 mmol/L (ref 21–32)
EGFR (African American): >60
EGFR (Non-African Amer.): >60
GLUCOSE: 83 mg/dL (ref 65–99)
OSMOLALITY: 275 (ref 275–301)
Potassium: 3.5 mmol/L (ref 3.5–5.1)
SGOT(AST): 41 U/L — ABNORMAL HIGH (ref 15–37)
SGPT (ALT): 44 U/L
SODIUM: 139 mmol/L (ref 136–145)
TOTAL PROTEIN: 7.6 g/dL (ref 6.4–8.2)

## 2015-01-18 LAB — URINALYSIS, COMPLETE
BACTERIA: NONE SEEN
BILIRUBIN, UR: NEGATIVE
BLOOD: NEGATIVE
Glucose,UR: NEGATIVE mg/dL (ref 0–75)
Ketone: NEGATIVE
Leukocyte Esterase: NEGATIVE
NITRITE: NEGATIVE
Ph: 6 (ref 4.5–8.0)
Protein: NEGATIVE
RBC,UR: <1 /HPF (ref 0–5)
SQUAMOUS EPITHELIAL: NONE SEEN
Specific Gravity: 1.003 (ref 1.003–1.030)

## 2015-01-18 LAB — DRUG SCREEN, URINE

## 2015-01-18 LAB — SALICYLATE LEVEL: Salicylates, Serum: 3.3 mg/dL — ABNORMAL HIGH

## 2015-01-18 LAB — CBC
HCT: 45.1 % (ref 40.0–52.0)
HGB: 14.9 g/dL (ref 13.0–18.0)
MCH: 30.1 pg (ref 26.0–34.0)
MCHC: 33 g/dL (ref 32.0–36.0)
MCV: 91 fL (ref 80–100)
Platelet: 220 10*3/uL (ref 150–440)
RBC: 4.95 10*6/uL (ref 4.40–5.90)
RDW: 16.5 % — ABNORMAL HIGH (ref 11.5–14.5)
WBC: 5.4 10*3/uL (ref 3.8–10.6)

## 2015-01-18 LAB — ACETAMINOPHEN LEVEL: Acetaminophen: <2 ug/mL

## 2015-01-18 LAB — ETHANOL: ETHANOL LVL: 103 mg/dL

## 2015-02-21 ENCOUNTER — Encounter (HOSPITAL_COMMUNITY): Payer: Self-pay | Admitting: Emergency Medicine

## 2015-02-21 ENCOUNTER — Emergency Department (HOSPITAL_COMMUNITY)
Admission: EM | Admit: 2015-02-21 | Discharge: 2015-02-21 | Disposition: A | Discharge status: Home / Self Care | Payer: BLUE CROSS/BLUE SHIELD | Attending: Emergency Medicine | Admitting: Emergency Medicine

## 2015-02-21 DIAGNOSIS — F10220 Alcohol dependence with intoxication, uncomplicated: Secondary | ICD-10-CM | POA: Insufficient documentation

## 2015-02-21 DIAGNOSIS — I252 Old myocardial infarction: Secondary | ICD-10-CM | POA: Insufficient documentation

## 2015-02-21 DIAGNOSIS — Z8659 Personal history of other mental and behavioral disorders: Secondary | ICD-10-CM | POA: Insufficient documentation

## 2015-02-21 DIAGNOSIS — Z8611 Personal history of tuberculosis: Secondary | ICD-10-CM | POA: Insufficient documentation

## 2015-02-21 DIAGNOSIS — Z8619 Personal history of other infectious and parasitic diseases: Secondary | ICD-10-CM | POA: Insufficient documentation

## 2015-02-21 DIAGNOSIS — Z7982 Long term (current) use of aspirin: Secondary | ICD-10-CM | POA: Insufficient documentation

## 2015-02-21 DIAGNOSIS — F10920 Alcohol use, unspecified with intoxication, uncomplicated: Secondary | ICD-10-CM

## 2015-02-21 DIAGNOSIS — Z720 Tobacco use: Secondary | ICD-10-CM | POA: Insufficient documentation

## 2015-02-21 DIAGNOSIS — Z7902 Long term (current) use of antithrombotics/antiplatelets: Secondary | ICD-10-CM | POA: Insufficient documentation

## 2015-02-21 DIAGNOSIS — G8929 Other chronic pain: Secondary | ICD-10-CM | POA: Insufficient documentation

## 2015-02-21 NOTE — ED Notes (Addendum)
Pt transported from [employer] requesting etoh detox.  Unable to obtain information from patient at this time. Pt only stating he is crazy and wants to die. Pt ask staff to shoot him or take him out back and hand him.

## 2015-02-21 NOTE — ED Provider Notes (Signed)
CSN: [id number]     Arrival date & time 02/21/15  0152 History   First MD Initiated Contact with Patient 02/21/15 [id number]     Chief Complaint  Patient presents with  . etoh detox      (Consider location/radiation/quality/duration/timing/severity/associated sxs/prior Treatment) Patient is a 54 y.o. male presenting with alcohol problem. The history is provided by the patient. No language interpreter was used.  Alcohol Problem This is a chronic problem. Pertinent negatives include no chest pain, chills or fever. Associated symptoms comments: He is requesting alcohol detox. No SI/HI, hallucinations. He states he usually gets Ativan or diazepam when he is coming off alcohol but denies history of seizures. .    Past Medical History  Diagnosis Date  . Hepatitis C   . Tuberculosis   . Myocardial infarction   . Anxiety   . Chronic pain   . Alcohol abuse   . History of heroin abuse    Past Surgical History  Procedure Laterality Date  . Coronary stent placement     No family history on file. History  Substance Use Topics  . Smoking status: Current Every Day Smoker  . Smokeless tobacco: Never Used     Comment: trying to quit  . Alcohol Use: Yes     Comment: pt had pint whiskey, 2 24 oz beers, 1 can of beer, more whiskey today. trying to quit    Review of Systems  Constitutional: Negative for fever and chills.  HENT: Negative.   Respiratory: Negative.  Negative for shortness of breath.   Cardiovascular: Negative.  Negative for chest pain.  Gastrointestinal: Negative.   Musculoskeletal: Negative.   Skin: Negative.   Neurological: Negative.   Psychiatric/Behavioral: Negative for suicidal ideas and hallucinations.      Allergies  Review of patient's allergies indicates no known allergies.  Home Medications   Prior to Admission medications   Medication Sig Start Date End Date Taking? Authorizing Provider  aspirin EC 81 MG EC tablet Take 1 tablet (81 mg total) by mouth daily.  09/24/14   Kathie Dike, MD  clopidogrel (PLAVIX) 75 MG tablet Take 1 tablet (75 mg total) by mouth daily before breakfast. 09/23/14   Kathie Dike, MD   BP 109/75 mmHg  Pulse 65  Temp(Src) 97.5 F (36.4 C) (Oral)  Resp 20  SpO2 98% Physical Exam  Constitutional: He is oriented to person, place, and time. He appears well-developed and well-nourished.  Acutely intoxicated.  HENT:  Head: Normocephalic.  Eyes: Conjunctivae are normal.  Neck: Normal range of motion. Neck supple.  Cardiovascular: Normal rate and regular rhythm.   Pulmonary/Chest: Effort normal and breath sounds normal.  Abdominal: Soft. Bowel sounds are normal. There is no tenderness. There is no rebound and no guarding.  Musculoskeletal: Normal range of motion.  Neurological: He is alert and oriented to person, place, and time.  Skin: Skin is warm and dry. No rash noted.  Psychiatric: He has a normal mood and affect.    ED Course  Procedures (including critical care time) Labs Review Labs Reviewed - No data to display  Imaging Review No results found.   EKG Interpretation None      MDM   Final diagnoses:  None    1. Alcohol intoxication 2. Alcohol dependence.   He has no history of seizures with alcohol withdrawal. He denies SI/HI. VSS. No evidence to suggest DT's (normotensive, not tachycardic/tachypneic, non-tremulous). He has been sleeping without complaint. Feel he can be discharged home with outpatient resources  for alcohol detox.   Dewaine Oats, PA-C 02/21/15 2248  April K Palumbo-Rasch, MD 02/21/15 [phone]

## 2015-02-21 NOTE — Discharge Instructions (Signed)
Finding Treatment for Alcohol and Drug Addiction °It can be hard to find the right place to get professional treatment. Here are some important things to consider: °· There are different types of treatment to choose from. °· Some programs are live-in (residential) while others are not (outpatient). Sometimes a combination is offered. °· No single type of program is right for everyone. °· Most treatment programs involve a combination of education, counseling, and a 12-step, spiritually-based approach. °· There are non-spiritually based programs (not 12-step). °· Some treatment programs are government sponsored. They are geared for patients without private insurance. °· Treatment programs can vary in many respects such as: °¨ Cost and types of insurance accepted. °¨ Types of on-site medical services offered. °¨ Length of stay, setting, and size. °¨ Overall philosophy of treatment.  °A person may need specialized treatment or have needs not addressed by all programs. For example, adolescents need treatment appropriate for their age. Other people have secondary disorders that must be managed as well. Secondary conditions can include mental illness, such as depression or diabetes. Often, a period of detoxification from alcohol or drugs is needed. This requires medical supervision and not all programs offer this. °THINGS TO CONSIDER WHEN SELECTING A TREATMENT PROGRAM  °· Is the program certified by the appropriate government agency? Even private programs must be certified and employ certified professionals. °· Does the program accept your insurance? If not, can a payment plan be set up? °· Is the facility clean, organized, and well run? Do they allow you to speak with graduates who can share their treatment experience with you? Can you tour the facility? Can you meet with staff? °· Does the program meet the full range of individual needs? °· Does the treatment program address sexual orientation and physical disabilities?  Do they provide age, gender, and culturally appropriate treatment services? °· Is treatment available in languages other than English? °· Is long-term aftercare support or guidance encouraged and provided? °· Is assessment of an individual's treatment plan ongoing to ensure it meets changing needs? °· Does the program use strategies to encourage reluctant patients to remain in treatment long enough to increase the likelihood of success? °· Does the program offer counseling (individual or group) and other behavioral therapies? °· Does the program offer medicine as part of the treatment regimen, if needed? °· Is there ongoing monitoring of possible relapse? Is there a defined relapse prevention program? Are services or referrals offered to family members to ensure they understand addiction and the recovery process? This would help them support the recovering individual. °· Are 12-step meetings held at the center or is transport available for patients to attend outside meetings? °In countries outside of the [place], see local directories for contact information for services in your area. °Document Released: 11/10/2005 Document Revised: 03/05/2012 Document Reviewed: 05/22/2008 °ExitCare® Patient Information ©2015 [employer]. This information is not intended to replace advice given to you by your health care provider. Make sure you discuss any questions you have with your health care provider. ° °Emergency Department Resource Guide °1) Find a Doctor and Pay Out of Pocket °Although you won't have to find out who is covered by your insurance plan, it is a good idea to ask around and get recommendations. You will then need to call the office and see if the doctor you have chosen will accept you as a new patient and what types of options they offer for patients who are self-pay. Some doctors offer discounts or will   set up payment plans for their patients who do not have insurance, but you will need to ask so you  aren't surprised when you get to your appointment. ° °2) Contact Your Local Health Department °Not all health departments have doctors that can see patients for sick visits, but many do, so it is worth a call to see if yours does. If you don't know where your local health department is, you can check in your phone book. The CDC also has a tool to help you locate your state's health department, and many state websites also have listings of all of their local health departments. ° °3) Find a Walk-in Clinic °If your illness is not likely to be very severe or complicated, you may want to try a walk in clinic. These are popping up all over the country in pharmacies, drugstores, and shopping centers. They're usually staffed by nurse practitioners or physician assistants that have been trained to treat common illnesses and complaints. They're usually fairly quick and inexpensive. However, if you have serious medical issues or chronic medical problems, these are probably not your best option. ° °No Primary Care Doctor: °- Call Health Connect at  832-8000 - they can help you locate a primary care doctor that  accepts your insurance, provides certain services, etc. °- Physician Referral Service- 1-800-533-3463 ° °Chronic Pain Problems: °Organization         Address  Phone   Notes  °[facility]  (336) 297-2271 Patients need to be referred by their primary care doctor.  ° °Medication Assistance: °Organization         Address  Phone   Notes  °[place] [facility] [address] °[place], [place] [address] (336) 641-8030 --Must be a resident of [place] °-- Must have NO insurance coverage whatsoever (no Medicaid/ Medicare, etc.) °-- The pt. MUST have a primary care doctor that directs their care regularly and follows them in the community °  °[facility]  (866) 331-1348   °[employer]  (888) 892-1162   ° °Agencies that provide inexpensive medical care: °Organization          Address  Phone   Notes  °[facility]  (336) 832-8035   °[facility]    (336) 832-7272   °[facility] [address] °[place], [place] [address] (336) 832-4777   °[facility] [address], °[place] (336) 271-4999   °[facility]    (336) 373-0678   °[facility]    (336) 272-1050   °[facility] ° [address], [place] Phone:  (336) 832-4444, Fax:  (336) 832-4440 Hours of Operation:  9 am - 6 pm, M-F.  Also accepts Medicaid/Medicare and self-pay.  °[facility] ° [address], [place] Phone: (336) 832-3150, Fax: (336) 832-3151. Hours of Operation:  8:30 am - 5:30 pm, M-F.  Also accepts Medicaid and self-pay.  °[facility] [place] [address], [place] Phone: (336) 878-6027   °[facility] [address], [place], [place] (336)723-1848, Ext. 123 Mondays & Thursdays: 7-9 AM.  First 15 patients are seen on a first come, first serve basis. °  ° °Medicaid-accepting [place] Providers: ° °Organization         Address  Phone   Notes  °[facility] [address], [place] (336) 641-2100   Also accepts self-pay patients.  °[facility] [address], [place] ° (336) 856-9996   °[facility] [address], [place] (336) 288-8857   °[facility] [address] [place] [address], [place] (336) 299-7000   °Veita Bland [address], [place]  ° (336) 373-1557 Only accepts [place] Access Medicaid patients after they have their name applied to their card.  ° °Self-Pay (no insurance) in [place]: ° °Organization         Address  Phone   Notes  °Sickle Cell Patients, [facility] [address], [place] (336) 832-1970   °[facility] [address], [place] (336) 832-4400    °[facility] ° [address], [place] (336) 992-4800   °[facility]/Dr. Osei-Bonsu ° [address] [place] [address], [place] or [address], [place] (336) 841-8500 Phone number for both [place] and [place] locations is the same.  °[facility] [address], [place] (336) 299-0000   °[facility] [address] [place] [address], [place] or [address] (336) 852-7530 °(336) 878-2260   °[facility] [address], [place] (336) 350-1642, phone; (336) 294-5005, fax Sees patients 1st and 3rd Saturday of every month.  Must not qualify for public or private insurance (i.e. Medicaid, Medicare, [place] Health Choice, Veterans' Benefits) • Household income should be no more than 200% of the poverty level •The clinic cannot treat you if you are pregnant or think you are pregnant • Sexually transmitted diseases are not treated at the clinic.  ° ° °Dental Care: °Organization         Address  Phone  Notes  °[facility] [address], [place] (336) 641-6152 Accepts children up to age 21 who are enrolled in Medicaid or [place] Health Choice; pregnant women with a Medicaid card; and children who have applied for Medicaid or [place] Health Choice, but were declined, whose parents can pay a reduced fee at time of service.  °[facility] [place]  [address], [place] (336) 641-7733 Accepts children up to age 21 who are enrolled in Medicaid or [place] Health Choice; pregnant women with a Medicaid card; and children who have applied for Medicaid or [place] Health Choice, but were declined, whose parents can pay a reduced fee at time of service.  °[facility] ° [address], [place] (336) 641-4533 Patients are seen by appointment only. Walk-ins are not accepted. [facility] will see patients 18  years of age and older. °Monday - Tuesday (8am-5pm) °Most Wednesdays (8:30-5pm) °$30 per visit, cash only  °[facility] ° [address], [place] (336) 641-4533 Patients are seen by appointment only. Walk-ins are not accepted. [facility] will see patients 18 years of age and older. °One Wednesday Evening (Monthly: Volunteer Based).  $30 per visit, cash only  °[facility]  (919) 537-3737 for adults; Children under age 4, call Graduate Pediatric Dentistry at (919) [phone]. Children aged 4-14, please call (919) [phone] to request a pediatric application. ° Dental services are provided in all areas of dental care including fillings, crowns and bridges, complete and partial dentures, implants, gum treatment, root canals, and extractions. Preventive care is also provided. Treatment   is provided to both adults and children. °Patients are selected via a lottery and there is often a waiting list. °  °[facility] [address], °[place] ° (336) 763-8833 [email or web address] °  °[facility] [address], [place], [place] (336)723-1848, Ext. 123 Second and Fourth Thursday of each month, opens at 6:30 AM; Clinic ends at 9 AM.  Patients are seen on a first-come first-served basis, and a limited number are seen during each clinic.  ° °[facility] ° [address], [place], [place] (336) 723-7904   Eligibility Requirements °You must have lived in [place], [place], or [place] counties for at least the last three months. °  You cannot be eligible for state or federal sponsored healthcare insurance, including Veterans Administration, Medicaid, or Medicare. °  You generally cannot be eligible for healthcare insurance through your employer.  °  How to apply: °Eligibility screenings are held every Tuesday and Wednesday afternoon from 1:00 pm until 4:00 pm. You do not need an appointment for the interview!  °[facility]  [address], [place], [place] 336-631-2330   °[facility]  336-342-8273   °[facility]  336-703-3100   °[facility]  336-570-6415   ° °Behavioral Health Resources in the Community: °Intensive Outpatient Programs °Organization         Address  Phone  Notes  °[facility] [address], [place], [place] 336-878-6098   °[facility] [address], [place], [place] 336-832-9800   °[facility] [address], [place], [place] ° 336-882-2125   °[facility] [address],  °[place], [place] 1-800-853-5163 or 336-641-4981   °Substance Abuse Resources °Organization         Address  Phone  Notes  °[facility]  336-882-2125   °[facility]  336-784-9470   °[facility]  336-285-9073   °[facility]  336-845-3988   °Residential & Outpatient Substance Abuse Program  1-800-659-3381   °Psychological Services °Organization         Address  Phone  Notes  °[facility]  336- 832-9600   °[facility]  336- 378-7881   °[facility] [address], [place] 1-800-853-5163 or 336-641-4981   ° °Mobile Crisis Teams °Organization         Address  Phone  Notes  °[facility], Mobile Crisis Care Unit  1-877-626-1772   °Assertive °[facility] ° [address] [place], [place] 336-834-9664   °Sharon DeEsch [address] °[place] 336-554-5454   ° °Self-Help/Support Groups °Organization         Address  Phone             Notes  °[facility] - variety of support groups  336- 373-1402 Call for more information  °Narcotics Anonymous (NA), [facility] [address], °[place]  2 meetings at this location  ° °Residential Treatment Programs °Organization         Address  Phone  Notes  °[facility] [address],    ° [place]   1-866-801-8205   °[facility] ° [address], [place], [place] 704-293-8524   °[facility] [address], [place] 336-845-3988 Admissions: 8am-3pm M-F  °[facility] [address]    °[place],   [place] 336-841-1104   °[facility] [address], [place], [place] 336-379-7146   °[facility] [address]  °[place], [place] 336-285-9073   °[facility] - Intensive Outpatient [address], [place], [place] 336-852-3033   °[facility] [address]  °[place], [place] 1-877-615-2722 or 336-784-9470   °[facility] [address] [place], [place] 336-227-7417 Accepts Medicaid  °[facility] [address]  °[place] 1-800-659-3381 Substance Abuse/Addiction Treatment  ° °[place] Behavioral Health Resources °Organization         Address  Phone  Notes  °[facility]  (888) 581-9988   °Julie Brannon, PhD [address] [place], [place]   (336) 349-5553 or (336) 951-0000   °[facility]   [address] °[place], [place] (336) 349-4454   °[facility] [address], [place], [place] (336) 342-8316 Insurance/Medicaid/sponsorship through [facility]  °[facility] [address]                                    [place], [place] (336) 342-8316 Therapy/tele-psych/case  °[facility] [address]  ° [place], [place] (336) 349-2233    °Dr. Arfeen  (336) 349-4544   °[facility] [place]  [employer] [place] [facility] 1) [address], [place] °2) [address], [place] °3)  [address], [place] (336) 349-3220 °(336) 342-7768 ° °(336) 342-8140   °[place] Child Abuse [facility] (336) 342-1394 or (336) 342-3537 (After Hours)    ° ° °

## 2015-02-23 ENCOUNTER — Encounter (HOSPITAL_COMMUNITY): Payer: Self-pay | Admitting: *Deleted

## 2015-02-23 ENCOUNTER — Emergency Department (HOSPITAL_COMMUNITY)
Admission: EM | Admit: 2015-02-23 | Discharge: 2015-02-23 | Disposition: A | Discharge status: Home / Self Care | Payer: Medicaid - Out of State | Attending: Emergency Medicine | Admitting: Emergency Medicine

## 2015-02-23 DIAGNOSIS — F10220 Alcohol dependence with intoxication, uncomplicated: Secondary | ICD-10-CM | POA: Diagnosis not present

## 2015-02-23 DIAGNOSIS — Z7982 Long term (current) use of aspirin: Secondary | ICD-10-CM | POA: Insufficient documentation

## 2015-02-23 DIAGNOSIS — I252 Old myocardial infarction: Secondary | ICD-10-CM | POA: Diagnosis not present

## 2015-02-23 DIAGNOSIS — Z8619 Personal history of other infectious and parasitic diseases: Secondary | ICD-10-CM | POA: Diagnosis not present

## 2015-02-23 DIAGNOSIS — F1010 Alcohol abuse, uncomplicated: Secondary | ICD-10-CM | POA: Diagnosis present

## 2015-02-23 DIAGNOSIS — Z720 Tobacco use: Secondary | ICD-10-CM | POA: Diagnosis not present

## 2015-02-23 DIAGNOSIS — Z7902 Long term (current) use of antithrombotics/antiplatelets: Secondary | ICD-10-CM | POA: Diagnosis not present

## 2015-02-23 DIAGNOSIS — G8929 Other chronic pain: Secondary | ICD-10-CM | POA: Diagnosis not present

## 2015-02-23 DIAGNOSIS — Z8611 Personal history of tuberculosis: Secondary | ICD-10-CM | POA: Diagnosis not present

## 2015-02-23 DIAGNOSIS — Z9861 Coronary angioplasty status: Secondary | ICD-10-CM | POA: Diagnosis not present

## 2015-02-23 NOTE — ED Notes (Signed)
Patient initially requested ED at [facility], because "When I go to [facility] for detox they never do anything for me." Patient ambulatory from EMS bay without difficulty Patient with multiple complaints: ETOH detox and Neck/Back/Joint pain  Patient states that during previous ED visits he was given "a list of women's shelters" instead of resources for substance abuse and psychiatric issues Patient alert and oriented x 4 and in NAD

## 2015-02-23 NOTE — ED Provider Notes (Signed)
CSN: [id number]     Arrival date & time 02/23/15  0430 History   First MD Initiated Contact with Patient 02/23/15 [phone]     Chief Complaint  Patient presents with  . Alcohol Detox      (Consider location/radiation/quality/duration/timing/severity/associated sxs/prior Treatment) Patient is a 54 y.o. male presenting with alcohol problem. The history is provided by the patient. No language interpreter was used.  Alcohol Problem Pertinent negatives include no chills or fever. Associated symptoms comments: Patient returns to the ED requesting alcohol detox. He denies SI/HI, hallucinations. He has been seen before and discharged with outpatient resources but has not followed up for treatment. "You never do anything for me". No N, V, D. No history of seizures from alcohol cessation..    Past Medical History  Diagnosis Date  . Hepatitis C   . Tuberculosis   . Myocardial infarction   . Anxiety   . Chronic pain   . Alcohol abuse   . History of heroin abuse    Past Surgical History  Procedure Laterality Date  . Coronary stent placement     History reviewed. No pertinent family history. History  Substance Use Topics  . Smoking status: Current Every Day Smoker  . Smokeless tobacco: Never Used     Comment: trying to quit  . Alcohol Use: Yes     Comment: pt had pint whiskey, 2 24 oz beers, 1 can of beer, more whiskey today. trying to quit    Review of Systems  Constitutional: Negative for fever and chills.  HENT: Negative.   Respiratory: Negative.   Cardiovascular: Negative.   Gastrointestinal: Negative.   Musculoskeletal: Negative.   Skin: Negative.   Neurological: Negative.       Allergies  Review of patient's allergies indicates no known allergies.  Home Medications   Prior to Admission medications   Medication Sig Start Date End Date Taking? Authorizing Provider  acetaminophen (TYLENOL) 325 MG tablet Take 650 mg by mouth every 6 (six) hours as needed for moderate pain.    Yes Historical Provider, MD  aspirin EC 81 MG EC tablet Take 1 tablet (81 mg total) by mouth daily. 09/24/14  Yes Kathie Dike, MD  clopidogrel (PLAVIX) 75 MG tablet Take 1 tablet (75 mg total) by mouth daily before breakfast. 09/23/14  Yes Kathie Dike, MD   BP 117/63 mmHg  Pulse 87  Temp(Src) 97.9 F (36.6 C) (Oral)  Resp 20  SpO2 100% Physical Exam  Constitutional: He is oriented to person, place, and time. He appears well-developed and well-nourished.  Acutely intoxicated.  HENT:  Head: Normocephalic.  Neck: Normal range of motion. Neck supple.  Cardiovascular: Normal rate and regular rhythm.   Pulmonary/Chest: Effort normal and breath sounds normal.  Abdominal: Soft. Bowel sounds are normal. There is no tenderness. There is no rebound and no guarding.  Musculoskeletal: Normal range of motion.  Neurological: He is alert and oriented to person, place, and time.  Skin: Skin is warm and dry. No rash noted.  Psychiatric: He has a normal mood and affect.    ED Course  Procedures (including critical care time) Labs Review Labs Reviewed - No data to display  Imaging Review No results found.   EKG Interpretation None      MDM   Final diagnoses:  None    1. Alcohol dependence.  Patient is stable to follow up outpatient with resources provided.    Dewaine Oats, PA-C 02/23/15 [place], MD 02/23/15 [phone]

## 2015-02-23 NOTE — ED Notes (Signed)
Patient resting in position of comfort with eyes closed RR WNL--even and unlabored with equal rise and fall of chest Patient in NAD Side rails up, call bell in reach  

## 2015-02-23 NOTE — ED Notes (Signed)
Patient reports that he has ongoing issues with ETOH abuse and that he wants help Patient states, "I was here last week and nothing was done." Patient stated that he is depressed and if he doesn't stop drinking then "I don't know what I will do." This nurse asked patient if he had thoughts of harming himself or others--patient stated "I can't answer that right now" Patient alert and oriented x 4 Patient with c/o pain to multiple sites, but would have periods of falling asleep and snoring while speaking to this nurse

## 2015-02-23 NOTE — ED Notes (Signed)
When waking patient up to review DC papers, patient told this nurse to "get the fuck out" and then proceeded to fall back to sleep Security to escort patient out of ED List of resources related to substance abuse treatment stapled to front of DC instructions

## 2015-02-23 NOTE — Discharge Instructions (Signed)
Finding Treatment for Alcohol and Drug Addiction °It can be hard to find the right place to get professional treatment. Here are some important things to consider: °· There are different types of treatment to choose from. °· Some programs are live-in (residential) while others are not (outpatient). Sometimes a combination is offered. °· No single type of program is right for everyone. °· Most treatment programs involve a combination of education, counseling, and a 12-step, spiritually-based approach. °· There are non-spiritually based programs (not 12-step). °· Some treatment programs are government sponsored. They are geared for patients without private insurance. °· Treatment programs can vary in many respects such as: °¨ Cost and types of insurance accepted. °¨ Types of on-site medical services offered. °¨ Length of stay, setting, and size. °¨ Overall philosophy of treatment.  °A person may need specialized treatment or have needs not addressed by all programs. For example, adolescents need treatment appropriate for their age. Other people have secondary disorders that must be managed as well. Secondary conditions can include mental illness, such as depression or diabetes. Often, a period of detoxification from alcohol or drugs is needed. This requires medical supervision and not all programs offer this. °THINGS TO CONSIDER WHEN SELECTING A TREATMENT PROGRAM  °· Is the program certified by the appropriate government agency? Even private programs must be certified and employ certified professionals. °· Does the program accept your insurance? If not, can a payment plan be set up? °· Is the facility clean, organized, and well run? Do they allow you to speak with graduates who can share their treatment experience with you? Can you tour the facility? Can you meet with staff? °· Does the program meet the full range of individual needs? °· Does the treatment program address sexual orientation and physical disabilities?  Do they provide age, gender, and culturally appropriate treatment services? °· Is treatment available in languages other than English? °· Is long-term aftercare support or guidance encouraged and provided? °· Is assessment of an individual's treatment plan ongoing to ensure it meets changing needs? °· Does the program use strategies to encourage reluctant patients to remain in treatment long enough to increase the likelihood of success? °· Does the program offer counseling (individual or group) and other behavioral therapies? °· Does the program offer medicine as part of the treatment regimen, if needed? °· Is there ongoing monitoring of possible relapse? Is there a defined relapse prevention program? Are services or referrals offered to family members to ensure they understand addiction and the recovery process? This would help them support the recovering individual. °· Are 12-step meetings held at the center or is transport available for patients to attend outside meetings? °In countries outside of the [place], see local directories for contact information for services in your area. °Document Released: 11/10/2005 Document Revised: 03/05/2012 Document Reviewed: 05/22/2008 °ExitCare® Patient Information ©2015 [employer]. This information is not intended to replace advice given to you by your health care provider. Make sure you discuss any questions you have with your health care provider. ° °Emergency Department Resource Guide °1) Find a Doctor and Pay Out of Pocket °Although you won't have to find out who is covered by your insurance plan, it is a good idea to ask around and get recommendations. You will then need to call the office and see if the doctor you have chosen will accept you as a new patient and what types of options they offer for patients who are self-pay. Some doctors offer discounts or will   set up payment plans for their patients who do not have insurance, but you will need to ask so you  aren't surprised when you get to your appointment.  2) Contact Your Local Health Department Not all health departments have doctors that can see patients for sick visits, but many do, so it is worth a call to see if yours does. If you don't know where your local health department is, you can check in your phone book. The CDC also has a tool to help you locate your state's health department, and many state websites also have listings of all of their local health departments.  3) Find a [facility] If your illness is not likely to be very severe or complicated, you may want to try a walk in clinic. These are popping up all over the country in pharmacies, drugstores, and shopping centers. They're usually staffed by nurse practitioners or physician assistants that have been trained to treat common illnesses and complaints. They're usually fairly quick and inexpensive. However, if you have serious medical issues or chronic medical problems, these are probably not your best option.    Behavioral Health Resources in the Community: Intensive Outpatient Programs Organization         Address  Phone  Notes  [place]. [address], [place], [place] [phone]   [facility] Outpatient [address], [place], [place]   [facility] [address] [place], [place]   [place] [address],  [place], [place] or [phone]   Substance Abuse Resources Organization         Address  Phone  Notes  [facility] Drug Services  [phone]   [place]  [phone]   [facility] [place]   Chinita Pester  [phone]   Residential & Outpatient Substance Abuse Program  [phone]   Psychological Services Organization         Address  Phone  Notes  [facility]  [place]  [phone]   [place] [address]  [address] [place] or [phone]    Mobile Crisis Teams Organization         Address  Phone  Notes  [facility], Mobile Crisis Care Unit  [phone]   [facility]  [address] [place], [place]   [place] [address], [place] [phone]    Self-Help/Support Groups Organization         Address  Phone             Notes  [place]. [facility] [place] - variety of support groups  [place] Call for more information  Narcotics Anonymous (NA), [facility] [address], [employer] [place]  2 meetings at this location   Special educational needs teacher         Address  Phone  Notes  [facility] [place],    [place]  [phone]   [facility]  [address] [place] [address], [place], [place]   [place], [place] [phone] Admissions: 8am-3pm M-F  [facility]stance [place] [address]    [place], [place] [phone]   [facility] [address] Jadene Pierini [place], [place]   [facility] [place]  [place],  [place] - Intensive Outpatient [address], [place], [place]   [facility] ([facility] [address]  [place], [place] [phone] or [phone]   [facility] ([facility]) [address], [place], [place] Accepts Medicaid  [facility] [place] [address],  [place] [phone] Substance Abuse/Addiction Treatment   [facility] Organization         Address  Phone  Notes  [facility]  [phone]   Domenic Schwab, PhD [address] Arlis Porta [place], [place]   [phone] or [phone]   [facility] [place] [address] [place], [place] [phone]   [address], [place], [place] [phone] Insurance/Medicaid/sponsorship through [facility] and Families [address]                                    [place], [place] [phone] [place] [address], [place] [phone]    Dr. Adele Schilder  [phone]   [facility] Dept. 1) [address], [employer] 2) [place] 3)  [place] [place] [address], [place] [phone]  [phone]   [place] [phone] or [phone] (After Hours)

## 2015-02-23 NOTE — ED Notes (Signed)
Bed: WA17 Expected date:  Expected time:  Means of arrival:  Comments: EMS - req detox, body aches, last drank 2 hrs ago

## 2015-03-08 ENCOUNTER — Emergency Department (HOSPITAL_COMMUNITY)
Admission: EM | Admit: 2015-03-08 | Discharge: 2015-03-08 | Disposition: A | Discharge status: Home / Self Care | Payer: BLUE CROSS/BLUE SHIELD | Attending: Emergency Medicine | Admitting: Emergency Medicine

## 2015-03-08 ENCOUNTER — Encounter (HOSPITAL_COMMUNITY): Payer: Self-pay

## 2015-03-08 DIAGNOSIS — F10920 Alcohol use, unspecified with intoxication, uncomplicated: Secondary | ICD-10-CM

## 2015-03-08 DIAGNOSIS — Z8611 Personal history of tuberculosis: Secondary | ICD-10-CM | POA: Insufficient documentation

## 2015-03-08 DIAGNOSIS — Z9861 Coronary angioplasty status: Secondary | ICD-10-CM | POA: Insufficient documentation

## 2015-03-08 DIAGNOSIS — L03113 Cellulitis of right upper limb: Secondary | ICD-10-CM | POA: Insufficient documentation

## 2015-03-08 DIAGNOSIS — L03119 Cellulitis of unspecified part of limb: Secondary | ICD-10-CM

## 2015-03-08 DIAGNOSIS — Z8619 Personal history of other infectious and parasitic diseases: Secondary | ICD-10-CM | POA: Insufficient documentation

## 2015-03-08 DIAGNOSIS — Z720 Tobacco use: Secondary | ICD-10-CM | POA: Insufficient documentation

## 2015-03-08 DIAGNOSIS — F10120 Alcohol abuse with intoxication, uncomplicated: Secondary | ICD-10-CM | POA: Insufficient documentation

## 2015-03-08 DIAGNOSIS — Z7902 Long term (current) use of antithrombotics/antiplatelets: Secondary | ICD-10-CM | POA: Insufficient documentation

## 2015-03-08 DIAGNOSIS — Z7982 Long term (current) use of aspirin: Secondary | ICD-10-CM | POA: Insufficient documentation

## 2015-03-08 DIAGNOSIS — G8929 Other chronic pain: Secondary | ICD-10-CM | POA: Insufficient documentation

## 2015-03-08 DIAGNOSIS — Z8659 Personal history of other mental and behavioral disorders: Secondary | ICD-10-CM | POA: Insufficient documentation

## 2015-03-08 DIAGNOSIS — Y908 Blood alcohol level of 240 mg/100 ml or more: Secondary | ICD-10-CM | POA: Insufficient documentation

## 2015-03-08 DIAGNOSIS — I252 Old myocardial infarction: Secondary | ICD-10-CM | POA: Insufficient documentation

## 2015-03-08 LAB — BASIC METABOLIC PANEL
Anion gap: 7 (ref 5–15)
BUN: 6 mg/dL (ref 6–23)
CALCIUM: 7.9 mg/dL — AB (ref 8.4–10.5)
CO2: 25 mmol/L (ref 19–32)
Chloride: 110 mmol/L (ref 96–112)
Creatinine, Ser: 0.84 mg/dL (ref 0.50–1.35)
GFR calc Af Amer: >90 mL/min (ref >90)
GFR calc non Af Amer: >90 mL/min (ref >90)
GLUCOSE: 118 mg/dL — AB (ref 70–99)
Potassium: 3.6 mmol/L (ref 3.5–5.1)
Sodium: 142 mmol/L (ref 135–145)

## 2015-03-08 LAB — CBC
HCT: 44.5 % (ref 39.0–52.0)
HEMOGLOBIN: 14.8 g/dL (ref 13.0–17.0)
MCH: 30.6 pg (ref 26.0–34.0)
MCHC: 33.3 g/dL (ref 30.0–36.0)
MCV: 92.1 fL (ref 78.0–100.0)
Platelets: 124 10*3/uL — ABNORMAL LOW (ref 150–400)
RBC: 4.83 MIL/uL (ref 4.22–5.81)
RDW: 14.3 % (ref 11.5–15.5)
WBC: 4.2 10*3/uL (ref 4.0–10.5)

## 2015-03-08 LAB — ETHANOL: Alcohol, Ethyl (B): 301 mg/dL — ABNORMAL HIGH (ref 0–9)

## 2015-03-08 MED ORDER — CEPHALEXIN 500 MG PO CAPS: 500.0000 mg | ORAL_CAPSULE | Freq: Three times a day (TID) | ORAL | Status: DC

## 2015-03-08 MED ORDER — SULFAMETHOXAZOLE-TRIMETHOPRIM 800-160 MG PO TABS: 1.0000 | ORAL_TABLET | Freq: Once | ORAL | Status: DC

## 2015-03-08 MED ORDER — CEPHALEXIN 500 MG PO CAPS
500.0000 mg | ORAL_CAPSULE | Freq: Once | ORAL | Status: AC
  Administered 2015-03-08: 500 mg via ORAL
  Filled 2015-03-08: qty 1

## 2015-03-08 MED ORDER — SULFAMETHOXAZOLE-TRIMETHOPRIM 800-160 MG PO TABS
1.0000 | ORAL_TABLET | Freq: Once | ORAL | Status: AC
  Administered 2015-03-08: 1 via ORAL
  Filled 2015-03-08: qty 1

## 2015-03-08 NOTE — Discharge Instructions (Signed)

## 2015-03-08 NOTE — ED Provider Notes (Signed)
CSN: [id number]     Arrival date & time 03/08/15  1203 History   First MD Initiated Contact with Patient 03/08/15 1210     Chief Complaint  Patient presents with  . Abscess     (Consider location/radiation/quality/duration/timing/severity/associated sxs/prior Treatment) Patient is a 54 y.o. male presenting with abscess. The history is provided by the patient.  Abscess Location:  Shoulder/arm Shoulder/arm abscess location:  R shoulder Abscess quality: induration, painful and redness   Red streaking: no   Progression:  Unchanged Pain details:    Quality:  No pain Chronicity:  Recurrent Associated symptoms: no fever     Past Medical History  Diagnosis Date  . Hepatitis C   . Tuberculosis   . Myocardial infarction   . Anxiety   . Chronic pain   . Alcohol abuse   . History of heroin abuse    Past Surgical History  Procedure Laterality Date  . Coronary stent placement     Family History  Problem Relation Age of Onset  . Family history unknown: Yes   History  Substance Use Topics  . Smoking status: Current Every Day Smoker -- 2.00 packs/day    Types: Cigarettes  . Smokeless tobacco: Never Used     Comment: trying to quit  . Alcohol Use: Yes    Review of Systems  Constitutional: Negative for fever.  Respiratory: Negative for shortness of breath.   Cardiovascular: Negative for chest pain.  All other systems reviewed and are negative.     Allergies  Review of patient's allergies indicates no known allergies.  Home Medications   Prior to Admission medications   Medication Sig Start Date End Date Taking? Authorizing Provider  acetaminophen (TYLENOL) 325 MG tablet Take 650 mg by mouth every 6 (six) hours as needed for moderate pain.    Historical Provider, MD  aspirin EC 81 MG EC tablet Take 1 tablet (81 mg total) by mouth daily. 09/24/14   Kathie Dike, MD  clopidogrel (PLAVIX) 75 MG tablet Take 1 tablet (75 mg total) by mouth daily before breakfast. 09/23/14    Kathie Dike, MD   BP 151/103 mmHg  Pulse 103  Temp(Src) 97.6 F (36.4 C) (Oral)  Resp 20  SpO2 95% Physical Exam  Constitutional: He is oriented to person, place, and time. He appears well-developed and well-nourished. No distress.  Intoxicated  HENT:  Head: Normocephalic and atraumatic.  Mouth/Throat: No oropharyngeal exudate.  Eyes: EOM are normal. Pupils are equal, round, and reactive to light.  Neck: Normal range of motion. Neck supple.  Cardiovascular: Normal rate and regular rhythm.  Exam reveals no friction rub.   No murmur heard. Pulmonary/Chest: Effort normal and breath sounds normal. No respiratory distress. He has no wheezes. He has no rales.  Abdominal: Soft. He exhibits no distension. There is no tenderness. There is no rebound.  Musculoskeletal: Normal range of motion. He exhibits no edema.  Neurological: He is alert and oriented to person, place, and time. No cranial nerve deficit. He exhibits normal muscle tone. Coordination normal.  Skin: No rash noted. He is not diaphoretic.     Nursing note and vitals reviewed.   ED Course  Procedures (including critical care time) Labs Review Labs Reviewed - No data to display  Imaging Review No results found.   EKG Interpretation None      MDM   Final diagnoses:  Alcohol intoxication, uncomplicated  Cellulitis of shoulder    54 year old male found for the ER drunk on Listerine. He's  here complaining of spider bites. He has 2 small areas of cellulitis on his left anterior shoulder. No evidence of abscess on exam. We'll plan for antibiotics. He has drunk, but cooperative. He has a chronic left shoulder separation. Will allow him to sober up. Keflex and bactrim initiated for his cellulitis. More sober on re-exam, eating and relaxing comfortably. Plan for discharge.  Evelina Bucy, MD 03/08/15 [phone]

## 2015-03-08 NOTE — ED Notes (Signed)
Patient was found outside the ED Lobby doors, lying on the sidewalk smoking. Patient states he has been drinking Listerine. Patient denies SI/HI. Patient has abscess areas to the right shoulder, upper back and right pubic area.

## 2015-03-08 NOTE — ED Notes (Signed)
Pt sleeping quietly with even, unlabored resp. Will continue to monitor

## 2015-03-08 NOTE — ED Notes (Signed)
Pt stated he does not have means to get his prescribed discharge medications and means to get home, RN got 2 Rx discount cards from social works desk and a bus pass. Pt discharged and educated on importance of taking his Rx abx. Pt verbalized understanding.

## 2015-04-17 NOTE — Consult Note (Signed)
PATIENT NAME:  Darrell [name], BUEHL MR#:  [id number] DATE OF BIRTH:  02-03-1961  DATE OF CONSULTATION:  12/09/2013  CONSULTING PHYSICIAN:  Osby Sweetin B. Bary Leriche, MD  REFERRING PHYSICIAN: Dr. Marta Antu  REASON FOR CONSULTATION: To evaluate patient with alcoholism and depression.   CHIEF COMPLAINT: "I need detox."   HISTORY OF PRESENT ILLNESS: Darrell [name] is originally from the [place] area. He arrived in [place] one week ago. He spent some time there drinking. He then hitchhiked to [place] and arrived here one day ago. He was trying to check himself into a homeless shelter, but he was intoxicated and was not accepted there. He came to this area, as he has a daughter and grandchildren here, and would like to spend Christmas with them. He knows now that he will not be allowed into the house if he is drinking. He decided to come to the Emergency Room and ask for substance abuse treatment so he can reunite with his family. He cannot guesstimate how much he has been drinking, as much as he can put his hands on. He has been drinking for many, many years. He has been in alcohol rehab before. At that time, he was diagnosed with depression, and he says that he was treated with BuSpar, with some improvement. He denies symptoms of depression now. He is just worried to get clean in time for Christmas with his grandchildren. He denies symptoms of depression, anxiety, or psychosis. There are no symptoms suggestive of bipolar mania. He denies, other than alcohol, substance use.   PAST PSYCHIATRIC HISTORY: As above. Treated for alcoholism in the past, with BuSpar for depression. He denies ever attempting suicide, but recalls one psychiatric hospitalization for depression.   FAMILY PSYCHIATRIC HISTORY: None reported.   PAST MEDICAL HISTORY: He had a heart attack in October, and should be taking Plavix and aspirin, but has not been compliant with treatment. He also has a history of high blood  pressure.   ALLERGIES: No known drug allergies.   MEDICATIONS ON ADMISSION: None.   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 He is originally from [place]. He relocated to our area, and would like to stay here if his family has him.   REVIEW OF SYSTEMS: CONSTITUTIONAL: No fevers or chills. No weight changes.  EYES: No double or blurred vision.  ENT: No hearing loss. RESPIRATORY: No shortness of breath or cough.  CARDIOVASCULAR: No chest pain or orthopnea.  GASTROINTESTINAL: No abdominal pain, nausea, vomiting or diarrhea.  GENITOURINARY: No incontinence or frequency.  ENDOCRINE: No heat or cold intolerance.  LYMPHATIC: No anemia or easy bruising.  INTEGUMENTARY: No acne or rash.  MUSCULOSKELETAL: No muscle or joint pain.  NEUROLOGIC: No tingling or weakness.  PSYCHIATRIC: See history of present illness for details.   PHYSICAL EXAMINATION: VITAL SIGNS: Blood pressure 149/92, pulse 115, respirations 20, temperature 97.6.  GENERAL: This is a well-developed male in no acute distress.  The rest of the physical examination is deferred to his primary attending.   LABORATORY DATA: Chemistries are within normal limits. Blood alcohol level 0.234. LFTs within normal limits, except for AST of 72 and ALT 117. Urine tox screen negative for substances. CBC within normal limits. Serum acetaminophen and salicylates are low.   MENTAL STATUS EXAMINATION: The patient is alert and oriented to person, place, time and situation. He is pleasant, polite and cooperative. He is marginally groomed. He maintains good eye contact. His  speech is of normal rhythm, rate and volume. Mood is worried, with full affect. Thought process is logical and goal oriented. Thought content: He denies suicidal or homicidal ideation. There are no delusions or paranoia. There are no auditory or visual hallucinations. His cognition is grossly intact. His insight and judgment are  questionable.   DIAGNOSES: AXIS I:  Alcohol dependence, alcohol intoxication.  AXIS II:  Deferred.  AXIS III:  Coronary artery disease, status post heart attack. AXIS IV:  Mental and physical illness, primary support, housing, financial, employment, access to care.  AXIS V:  GAF 45.   PLAN: 1.  The patient is on IVC.  2.  He was referred to [facility] alcohol rehab facility in [place]. Anticipated transfer tomorrow when bed available.  3.  Will restart Plavix and aspirin.  4.  We will start CIWA protocol. I will also prescribe some Librium to limit necessity for p.r.n. Ativan.  5.  Psychiatry will follow up.     ____________________________ Wardell [place]. Bary Leriche, MD jbp:mr D: 12/09/2013 18:27:39 ET T: 12/09/2013 19:02:43 ET JOB#: [id number]  cc: Cierra Rothgeb B. Bary Leriche, MD, <Dictator> Clovis Fredrickson MD ELECTRONICALLY SIGNED 12/24/2013 4:33

## 2015-04-17 NOTE — H&P (Signed)
PATIENT NAME:  Darrell [name], Darrell [name] MR#:  [id number] DATE OF BIRTH:  [birth year]  DATE OF ADMISSION:  12/18/2013  IDENTIFYING INFORMATION AND CHIEF COMPLAINT: A 54 year old man who presented to the Emergency Room today saying, "I need detox."   HISTORY OF PRESENT ILLNESS: Information obtained from the patient and the chart. The patient came to the Emergency Room intoxicated, requesting detox. He wanted to go to [facility], but has been refused admission there. We are told that he had been uncooperative to their mind, and they do not think they can readmit him. The patient says he has been drinking as much as he can. He went to [place] back on the 19th of December and must have stayed for only a day or two, got out, came back to the area and immediately resumed drinking again. He is evasive about whether he is using any other drugs of abuse. He says his mood is depressed. He denies suicidal ideation. Denies homicidal ideation. Denies hallucinations. He says he has been prescribed some medicine, but he does not know what, and he is not even sure what for.   PAST PSYCHIATRIC HISTORY: He says he has been in multiple detoxes. He says he has been in substance abuse treatment programs in the past. He has not been impressed that they worked very well. He does say that he has had some extended times of sobriety in the past. He denies any psychiatric hospitalizations for anything besides detox. The only psychiatric medicines he recalls being prescribed in the past were BuSpar and Vistaril. He denies any history of suicidal behavior. Denies history of homicidal behavior. He says that he has had a seizure at least 1 time in the past coming off alcohol and has also had what he thinks were delirium tremens.   PAST MEDICAL HISTORY: The patient apparently had a heart attack earlier this year and is supposed to be on aspirin and Plavix. He also was prescribed melatonin, and that probably came for [facility], also Pravachol for cholesterol.  He is not able to give much history about other medical problems.   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 Unfortunately, he has not been able to maintain, it seems, any sobriety to do that. He does not have a place to stay. He says that recently he has been just staying in the woods out back behind the [place].   FAMILY HISTORY: Denies family history of mental illness.   CURRENT MEDICATIONS: Zestril 5 mg in the morning, melatonin 3 mg at night, pravastatin 20 mg at night, aspirin 81 mg a day, Plavix 75 mg per day.   ALLERGIES: No known drug allergies.   REVIEW OF SYSTEMS: Complains of feeling shaky and sick to his stomach, dizzy when he tries to stand up. Mood is depressed, feels dejected and hopeless. Denies suicidal ideation. Denies hallucinations. Otherwise, negative review of systems. No chest pain, no shortness of breath.   MENTAL STATUS EXAMINATION: Disheveled beat-up-looking gentleman who was somewhat difficult to arouse and still very tired. Passively cooperative with the interview at best. Eye contact intermittent. Psychomotor activity very sluggish. Speech decreased in total amount, but understandable. Affect flat. Mood stated as being depressed. Thoughts slow but lucid. No sign of loosening of associations or delusions. Denies auditory or visual hallucinations. Denies suicidal or homicidal ideation. Judgment and insight recently somewhat impaired. Intelligence baseline appears normal. Alert and oriented x4.   PHYSICAL EXAMINATION:  SKIN: Multiple minor scrapes and scratches on his face and arms. No major open sores that look like they need acute treatment.  HEENT: Pupils equal and reactive. Face symmetric.  NEUROLOGIC: Strength and reflexes symmetric and normal throughout. Cranial nerves symmetric and normal. Gait within normal limits.  EXTREMITIES: Full range of motion at all extremities.   NECK AND BACK: Nontender.  LUNGS: Clear without wheezes.  HEART: Regular rate and rhythm.  ABDOMEN: Soft, nontender, normal bowel sounds.  VITAL SIGNS: Most recent vital signs include a blood pressure of 140/93, respirations 19, pulse 98, temperature 98.9.   LABORATORY RESULTS: Urinalysis 3+ leukocyte esterase, but no white blood cells, probably needs to be repeated. Troponin negative. Drug screen positive for cannabis and benzodiazepines. TSH normal. CBC normal. Alcohol level measured at 6:15 this morning 128. Glucose elevated at 126, AST elevated at 38. EKG: Old inferior infarct, tachycardic at the time.   ASSESSMENT: A 54 year old man who has repeatedly presented to our facility intoxicated, requesting detox. He has gone to [facility] once, and he has gone to [facility] once and has left both programs and immediately started drinking again. He continues to state that he has a history of delirium tremens and seizures. He needs hospitalization for safe detox.   TREATMENT PLAN: Admit to psychiatry. Alcohol withdrawal protocol. Blood pressure medicines continued. Educational therapy and review of medicines done. Engage him in groups when possible. Work on trying to get him fully detoxed. Monitor for any signs of delirium.   DIAGNOSIS, PRINCIPAL AND PRIMARY:  AXIS I: Alcohol dependence.   SECONDARY DIAGNOSIS:  AXIS I: Substance-induced mood disorder, depressed and anxious.  AXIS II: Deferred.  AXIS III: High blood pressure, history of coronary artery disease with a myocardial infarction, dyslipidemia. AXIS IV: Severe, homelessness.  AXIS V: Functioning at time of evaluation is 30.   ____________________________ Darrell Lex, MD jtc:lb D: 12/18/2013 15:51:42 ET T: 12/18/2013 16:08:44 ET JOB#: [id number]  cc: Darrell Lex, MD, <Dictator> Darrell Lex MD ELECTRONICALLY SIGNED 12/20/2013 11:24

## 2015-04-17 NOTE — Consult Note (Signed)
Brief Consult Note: Diagnosis: Alcohol dependence.   Patient was seen by consultant.   Consult note dictated.   Recommend further assessment or treatment.   Orders entered.   Comments: Mr. Sebesta is an alcoholic. He desires residential substaqnce abuse treatment. He is not suicidal or homicidal.   PLAN: 1. Ther patient was referred to [facility]. Anticipated trannsfer tomorrow.   2. Will start [facility] protocol.  3. Will continue Plavix and ASA.  4. psychiatry will follow along.  Electronic Signatures: Orson Slick (MD)  (Signed 15-Dec-14 13:09)  Authored: Brief Consult Note   Last Updated: 15-Dec-14 13:09 by Orson Slick (MD)

## 2015-04-17 NOTE — Consult Note (Signed)
Brief Consult Note: Diagnosis: alcohol dependence.   Patient was seen by consultant.   Consult note dictated.   Recommend further assessment or treatment.   Orders entered.   Comments: Psychiatry: Patient seen and chart reviewed. See full note. Admitted for detox from alcohol.  Electronic Signatures: Clapacs, Madie Reno (MD)  (Signed 24-Dec-14 15:52)  Authored: Brief Consult Note   Last Updated: 24-Dec-14 15:52 by Gonzella Lex (MD)

## 2015-04-17 NOTE — Discharge Summary (Signed)
PATIENT NAME:  Darrell, [name] MR#:  [id number] DATE OF BIRTH:  09-29-1961  DATE OF ADMISSION:  12/18/2013 DATE OF DISCHARGE:  12/20/2013.   HOSPITAL COURSE:  See dictated history and physical for details of admission. A 54 year old man presented to the Emergency Room, intoxicated, requesting detox, claiming a history of possible seizures and DTs in the past. The patient was admitted to the hospital primarily for alcohol detox. At no point did he voice any suicidal ideation or threatening behavior or appear to be psychotic. He not delirious on admission. The patient has shown minimal symptoms of alcohol withdrawal. His vital signs are stable. He is not shaking. He states that his mood is improved. He is now requesting discharge. This patient has been referred already to the alcohol and drug abuse treatment program which he has left prematurely. He has not followed up with [facility], either. He is now stating to me that he understands that if he continues to drink, he is liable developed cirrhosis and to die from it. He clearly understands the risks. He does not appear to be psychotic or delirious. The patient will be discharged today. He is given five 1 mg lorazepam if needed for minor symptoms of withdrawal, otherwise continued on his usual outpatient medicine. He is stating that he will follow up with Alcoholics Anonymous. He is planning to go stay at the homeless shelter.   MENTAL STATUS EXAM AT DISCHARGE:  Neatly dressed and groomed man, looks his stated age or older, cooperative with the interview. Good eye contact, normal psychomotor activity. No sign of shaking. Speech normal rate, tone and volume. Affect smiling, pleasant, upbeat, euthymic. Mood stated as good. Thoughts are lucid with no sign of delusional thinking or loosening of associations or delirium. Shows good insight into his condition and notes that he had an alcohol dependence problem and that he is liable causing self or other medical damage if  he continues to drink. Alert and oriented x 4.   DISCHARGE MEDICATIONS: 1.  Lisinopril 5 mg once a day.  2.  Pravastatin 20 mg once a day.  3.  Aspirin 81 mg once a day.  4.  Plavix 75 mg once a day.  5.  Lorazepam once every 2 hours as needed for alcohol withdrawal, 5 pills only given.  6.  Magnesium 400 mg once a day.  7.  Melatonin 3 mg at night.  8.  Multivitamin once a day.   LABORATORY RESULTS:  Urinalysis showed positive leukocyte esterase, but otherwise negative. No white blood cells. Drug screen positive for benzodiazepines and cannabis. TSH normal at 1.6. CBC normal. Chemistry panel elevated glucose 126, AST elevated 38.   DISPOSITION:  Discharge to the homeless shelter, follow up with Alcoholics Anonymous and referred to [place].   DIAGNOSIS, PRINCIPAL AND PRIMARY:  AXIS I: Alcohol dependence.   SECONDARY DIAGNOSES:  AXIS I: Cannabis abuse.  AXIS II: Deferred.  AXIS III: High blood pressure, a history of coronary artery disease, dyslipidemia, gastric reflux symptoms.  AXIS IV: Severe from homelessness.  AXIS V: Functioning at time of discharge 55.    ____________________________ Gonzella Lex, MD jtc:jm D: 12/20/2013 13:49:28 ET T: 12/20/2013 14:49:08 ET JOB#: [id number]  cc: Gonzella Lex, MD, <Dictator> Gonzella Lex MD ELECTRONICALLY SIGNED 12/21/2013 17:00

## 2015-04-18 NOTE — Consult Note (Signed)
PATIENT NAME:  Darrell [name], Darrell [name] MR#:  [id number] DATE OF BIRTH:  1961-10-11  DATE OF CONSULTATION:  03/09/2014  REFERRING PHYSICIAN:   CONSULTING PHYSICIAN:  Jaquel Coomer K. Teea Ducey, MD  SUBJECTIVE: The patient was seen in consultation. 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 Never married and calls himself as homeless and lives in [place], [place], [place], and [place]. The patient comes to the Emergency Room with the chief complaint, "having chest pains and drinking too much alcohol." According to information obtained, the patient was intoxicated and was drinking alcohol quite a bit and heavy. The patient reports that he has been drinking alcohol on a regular basis for the past several weeks and decided to get help for the same. The patient reports that he had 3 DWIs and lost his driver's license. He had been arrested for public drunkenness on many occasions.  MENTAL STATUS: The patient was seen lying in bed comfortable, alert and oriented, calm, pleasant and cooperative. Face appears red from his intoxication from alcohol. Affect is bright and cheerful. Denies feeling depressed. Denies feeling hopeless or helpless. Realizes that he needs help for his alcohol drinking. No psychosis. Does not appear to be responding to internal stimuli. Does not appear to be going through DTs. Denies any ideas or plans to hurt himself or others. Insight and judgment guarded with poor impulse control.   IMPRESSION: Alcohol dependence, chronic and continuous, with intoxication at the time he came to the hospital.   PLAN:  Continue CI[facility] protocol and detox. The patient is already referred for [facility] and hopefully he will be accepted to the program. and  is to evaluate the patient on Monday, 03/10/2014, for appropriate placement in a substance abuse program, if the patient is willing to go.  ____________________________ Wallace Cullens. Franchot Mimes, MD skc:sb D: 03/09/2014 20:01:39  ET T: 03/10/2014 08:15:23 ET JOB#: [id number]  cc: Arlyn Leak K. Franchot Mimes, MD, <Dictator> Dewain Penning MD ELECTRONICALLY SIGNED 03/11/2014 20:40

## 2015-04-18 NOTE — Discharge Summary (Signed)
PATIENT NAME:  Darrell [name], Darrell [name] MR#:  [id number] DATE OF BIRTH:  [birth year]  DATE OF ADMISSION:  02/05/2014 DATE OF DISCHARGE:  02/18/2014  HOSPITAL COURSE: See dictated history and physical for details of admission. A 54 year old man with a history of alcohol dependence, antisocial behavior and depression admitted to the hospital with depressed mood, alcohol abuse and intoxication and severe social stresses. He was treated for alcohol detox and had mild symptoms of tremor and sedation, but overall detoxed without much incident. His mood continued to be dysphoric and down, but he did not engage in any suicidal behavior. He was engaged in daily individual and group therapy. Mood gradually improved somewhat. The patient had severe social stresses in the form of homelessness with extremely high risk of relapse and return to depressive symptoms. He was interested in going to a longer term rehab program. We made application for him to the [facility] and eventually a bed was available. He was discharged on the 24th to the [facility] program. At the time, his mood was improved. He denied suicidal ideation. Thoughts were lucid and organized and free of dementia. Physically, he remained stable. Counseling was done about the importance of staying in the treatment program and staying focused on recovery.   DISCHARGE MEDICATIONS: Aspirin 81 mg per day, Plavix 75 mg per day, hydroxyzine 50 mg every 8t hours as needed for anxiety and ibuprofen 600 mg every 6 hours as needed for pain.   MENTAL STATUS AT DISCHARGE: Neatly dressed and groomed man, who looks his stated age or older, cooperative with interview. Eye contact good. Psychomotor activity normal. Speech normal rate, tone and volume. Affect somewhat blunted and dysphoric. Still denies suicidal or homicidal ideation. Mood stated as okay. Thoughts are lucid without any evidence of delusions or hallucinations. The patient's short and long-term memory are intact; 3 out of 3  objects at 3 minutes and long-term memory for past treatment. Fund of knowledge normal. Judgment and insight adequate.   LABORATORY RESULTS: EKG was normal. Troponins were all normal with no sign of cardiac problems on admission. Urinalysis negative. Drug screen negative. TSH normal. Salicylates negative. CBC showed elevated hematocrit 52.4. Alcohol level was 240 on admission. Chemistry panel: Elevated AST at 39.   DISPOSITION: Discharge to [facility].   DIAGNOSIS, PRINCIPAL AND PRIMARY:  AXIS I: Substance-induced depression, resolved.  SECONDARY DIAGNOSES:  AXIS I: Alcohol dependence AXIS II: Antisocial traits.  AXIS III: History of possible coronary artery disease in the past, stable.  AXIS IV: Severe from homelessness.  AXIS V: Functioning at time of discharge is 55   ____________________________ Gonzella Lex, MD jtc:aw D: 03/16/2014 23:15:53 ET T: 03/17/2014 09:43:56 ET JOB#: [id number]  cc: Gonzella Lex, MD, <Dictator> Gonzella Lex MD ELECTRONICALLY SIGNED 03/17/2014 12:16

## 2015-04-18 NOTE — Consult Note (Signed)
Brief Consult Note: Diagnosis: Alcohol dependence.   Patient was seen by consultant.   Recommend further assessment or treatment.   Orders entered.   Comments: [name] is no longer drunk or suicidal. He was not accepted to [facility]. He wants to go to [facility] in [place].  PLAN: 1. The patient no longer meets criteria for [facility]. I will terminate proceedings. Please discharge as appropriate.  2. No medications recommended.   3. F/U with [facility].  Electronic Signatures: Orson Slick (MD)  (Signed 16-Mar-15 17:08)  Authored: Brief Consult Note   Last Updated: 16-Mar-15 17:08 by Orson Slick (MD)

## 2015-04-18 NOTE — H&P (Signed)
PATIENT NAME:  Darrell [name], POSTEN MR#:  [id number] DATE OF BIRTH:  [birth year]  DATE OF ADMISSION:  02/04/2014  IDENTIFYING INFORMATION AND CHIEF COMPLAINT: A 54 year old man with a history of alcohol and substance dependence presented voluntarily to the hospital.   CHIEF COMPLAINT: "I need detox."   HISTORY OF PRESENT ILLNESS: Information obtained from the patient and the chart. The patient states he has been drinking 8 to 10 of what he calls "large high alcoholic drinks" per day. He has been going at this level for several months. He feels like he is getting more sick and weak. Mood is getting worse. He is feeling more out of control and frightened for his health. He came into the hospital seeking detox. He admits that he uses marijuana as well but minimizes denies any other drug use. His mood has recently been anxious and a little bit depressed, partially related to substance abuse, partially related to being homeless and multiple social stresses. He denies any suicidal ideation. Denies any psychotic symptoms. He is not currently seeing anyone for outpatient mental health treatment or on any medication. He does have a past history of alcohol withdrawal seizures and delirium tremens. He has recently been having blackouts.   PAST PSYCHIATRIC HISTORY: Long history of alcohol dependence. Multiple detoxes. Does have a history of seizures and delirium tremens in the past. He has been referred to outpatient programs in the past for follow-up. Currently we are told that he is not allowed to go to [facility] or to the alcohol and drug abuse treatment center. He does say that he has been able to maintain some lengthy sobriety at times by attending [facility] for a long period of time. He has been diagnosed with mood disorders in the past and been treated with medication, but does not think that any of them ever did him any good. No clear history of manic symptoms. Denies any history of suicidal behavior.   PAST MEDICAL HISTORY:  Has hepatitis C. He says despite the diagnosis, he does not know of any symptoms he has ever had. No other known specific medical diagnoses.   SOCIAL HISTORY: The patient recently has had no place to live. He has been staying with friends occasionally have to shelter, just sort of bopping around here and there. Not currently working. Seems to have minimal to no family support, having alienated most people he knows.   FAMILY HISTORY: History of heavy substance abuse in the family.   SMOKING HISTORY: Smokes about 1/2 pack a day.   REVIEW OF SYSTEMS: He endorses depressed and anxious mood. Poor sleep. Fatigue. Feeling jittery. Feeling sick to his stomach without throwing up. Having a bit of a tremor. Recently, he has been drinking heavily and has had some blackouts. Denies any suicidal thoughts. Denies any visual or auditory hallucinations. Denies any cardiac symptoms or pulmonary symptoms. The rest of the review of systems is normal and negative.   MENTAL STATUS EXAMINATION: Disheveled malodorous gentleman who looks his stated age. Passively cooperative with the interview. Eye contact intermittent. Psychomotor activity sluggish. Speech decreased in total amount but easy to understand. Affect flat. Mood stated as being worn out. Thoughts appear to be lucid but slow. No sign of delusional thinking or loosening of associations. Denies auditory or visual hallucinations. Denies any current suicidal or homicidal ideation. His judgment and insight currently appear to be adequate and baseline. Intelligence is normal. Normal fund of knowledge about current events. Short-term memory intact, 3 out of 3 objects immediately  and at 1 minute. More distant memory, some lacuna from drinking. Alert and oriented x 4.   PHYSICAL EXAMINATION: GENERAL: The patient looks like he is tired and run down. He still has full range of motion at all extremities. A little bit unsteady when he stands up but then is able to walk with a  normal gait. No acute skin lesions.  HEENT: Pupils equal and reactive. Face symmetric. Oral mucosa normal. Strength and reflexes normal and symmetric throughout. Cranial nerves symmetric and normal.  LUNGS: Clear without wheezes.  HEART: Regular rate and rhythm.  ABDOMEN: Soft, nontender, normal bowel sounds.  VITAL SIGNS: Temperature most recently 98.1, pulse 80, respirations 20, blood pressure 142/104.   LABORATORY RESULTS: EKG: Tachycardia, possible left atrial enlargement. Acetaminophen and salicylates normal. Drug screen positive for cannabis and benzodiazepines. Urinalysis unremarkable. Troponin negative. CBC normal. Chemistry panel shows a low potassium at 3.4. Alcohol level was 266 on presentation. X-ray of the right elbow shows a small avulsion injury at the triceps insertion without any dislocation. Chest x-ray shows no finding.   ASSESSMENT: A 54 year old man with alcohol dependence and substance-induced mood disorder presents to the hospital requesting detox. Does have physical signs of alcohol withdrawal and a history of seizures and DTs. Minimal support outside the hospital. Not eligible to go to [facility] or to the alcohol and drug abuse treatment center. The patient needs hospital level treatment for alcohol withdrawal and improving his mood and cognition.   TREATMENT PLAN: Admit to psychiatry. Fall and seizure precautions. Usual alcohol withdrawal protocol and medications to prevent seizures. Monitor for improvement in mood. Monitor for stabilization of cognition. Work on discharge planning.   DIAGNOSIS, PRINCIPAL AND PRIMARY:  AXIS I: Alcohol dependence.   SECONDARY DIAGNOSES: AXIS I: Substance-induced mood disorder, anxious and depressed.  AXIS II: Deferred.  AXIS III: Hepatitis C.  AXIS IV: Severe, homeless, heavy alcohol use.  AXIS V: Functioning at time of evaluation 35.     ____________________________ Gonzella Lex, MD jtc:dp D: 02/05/2014 16:41:59 ET T: 02/05/2014  17:05:00 ET JOB#: [id number]  cc: Gonzella Lex, MD, <Dictator> Gonzella Lex MD ELECTRONICALLY SIGNED 02/05/2014 18:14

## 2015-04-18 NOTE — Consult Note (Signed)
PATIENT NAME:  Darrell [name], Darrell [name] MR#:  [id number] DATE OF BIRTH:  [birth year]  DATE OF CONSULTATION:  12/14/2014  CONSULTING PHYSICIAN:  Alisabeth Selkirk K. Lailanie Hasley, MD  SUBJECTIVE: The patient was seen in consultation in [facility] [place] [facility] . The patient is a 54 year old, white male who was intoxicated and rather drunk, but he is easily arousable. 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 The patient reports that he has a long history of alcohol drinking to the extent that he gets drunk and decided that he needed help.   PAST PSYCHIATRIC HISTORY: History of inpatient hospital psychiatry 4 or 5 times as various hospitals which includes [facility], [facility] and [facility]. No history of suicide attempts, but did have suicidal wishes. Not being followed by any psychiatrist or outpatient program, because when they discharge him, they give him outpatient followup but he does not keep up with the same.  SOCIAL HISTORY: Admits that he has been drinking alcohol, probably in binges to the extent that he gets drunk. Had 3 DWIs and lost his driver's license. Arrested for public drunkenness "more than 100 times." Does admit smoking THC occasionally; last smoked it a week ago. Denies any other street or prescription drug abuse. Denies IV drugs. Does admit smoking nicotine cigarettes; a few per day for many years.  MENTAL STATUS: The patient is drowsy but arousable. He is oriented to place, person and time. He knew the date and time and he knew the reason why he was brought here. Affect is appropriate with his mood, which is low, down and depressed. Admits that he feels low, down and depressed. Admits feeling hopeless but denies feeling helpless as he feels he is being helped here. Admits feeling worthless and useless about himself. Denies auditory or visual hallucinations. Does not appear to be responding to internal stimuli. Does not appear to be in DTs. Has  suicidal wishes but currently he has no active plans, though he stated that he might run his truck out. Insight and judgment guarded. Impulse control poor.   IMPRESSION: Alcohol dependence, chronic, continuous. Substance and use mood disorder. Recommend continuing CIWA protocol. The patient is interested in getting help with alcohol dependence and social services is looking into appropriate program on an outpatient basis for the same.    ____________________________ Wallace Cullens. Franchot Mimes, MD skc:TT D: 12/14/2014 13:36:20 ET T: 12/14/2014 19:48:38 ET JOB#: [id number]  cc: Arlyn Leak K. Franchot Mimes, MD, <Dictator> Dewain Penning MD ELECTRONICALLY SIGNED 12/16/2014 9:03

## 2015-04-26 NOTE — Consult Note (Signed)
PATIENT NAME:  Darrell [name], Darrell [name] MR#:  [id number] DATE OF BIRTH:  [birth year]  DATE OF CONSULTATION:  01/18/2015  CONSULTING PHYSICIAN:  Jari Dipasquale K. Franchot Mimes, MD  SUBJECTIVE:  The patient seen in consultation at [facility] [place], [place].  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  The patient is single, never married and currently he considers himself as homeless.  The patient reports that he has been drinking too much alcohol and he wants to "get away from alcohol".      The patient reports that he drinks alcohol quite a bit, and too much and as much as he can get.  He admits that he drinks about twelve 24 ounce beers per day and drinks as much as he can as often as he can.  When the patient was asked how he got the money he reports "I have friends around and people around."  Had 3 DWIs many years ago and lost his driver's license, arrested for public drunkenness on many occasions.  He denies any other street drugs or prescription drug abuse.  He does admit smoking nicotine cigarettes at the rate of 5 per day.  On mental status the patient is very disheveled in appearance for grooming, hair not combed, alert and oriented, calm, pleasant but he is irritable when questions are asked.  Denies feeling depressed, , denies auditory or visual hallucinations.  Does not appear to be responding to internal stimuli.  Cognition grossly intact.  General, knowledge and information are fair.. Denies suicidal or homicidal plans.  Contract for safety.  Insight and judgment guarded.  Impulse control is failing.    IMPRESSION:  Alcohol dependence, chronic continues with intoxication at the time of admission.    RECOMMENDATION:  .  The patient is eager to get help for his alcohol dependence problems and social services  is to look  into appropriate placement and probably [facility] for help.   ____________________________ Wallace Cullens. Franchot Mimes, MD skc:at D: 01/18/2015 13:04:14 ET T: 01/18/2015 13:28:56 ET JOB#: [id number]  cc: Arlyn Leak K. Franchot Mimes, MD, <Dictator> Dewain Penning MD ELECTRONICALLY SIGNED 01/24/2015 11:58

## 2015-04-26 NOTE — Consult Note (Signed)
PATIENT NAME:  Darrell [name], WADDINGTON MR#:  [id number] DATE OF BIRTH:  10/05/1961  DATE OF CONSULTATION:  01/20/2015  REFERRING PHYSICIAN:   CONSULTING PHYSICIAN:  Gonzella Lex, MD  IDENTIFYING INFORMATION AND REASON FOR CONSULTATION: This is a follow-up evaluation of this 54 year old gentleman with a long-standing history of alcohol abuse.   CHIEF COMPLAINT: Today: "Not so good."   HISTORY OF PRESENT ILLNESS: He presented yesterday to the Emergency Room with complaints of ongoing alcohol abuse and depressed mood. Mood stays down and depressed. Denies psychotic symptoms. Denies any suicidal ideation or wish to die. Says he has been continuing to drink all day, every day. Has not followed up with any outpatient treatment. Not on any medication. Not seeing anybody for any mental health care. He talks a lot about mostly wanting to go to a rehab program because he does not have a place to live and it would give him a place to stay for a while.   PAST PSYCHIATRIC HISTORY: Multiple admissions to the Emergency Room. He has been to [facility] and to other inpatient substance abuse programs multiple times. Does not typically follow through with outpatient treatment, has not been able to maintain sobriety recently.   MENTAL STATUS EXAMINATION: Today, the patient is adequately groomed, cooperative with the interview, calm, no shakes. Eye contact: Good. Psychomotor activity: Normal. Speech: Normal rate, tone and volume. Affect: Mildly dysphoric, not tearful, but positively reactive. Mood: Stated as being depressed. Thoughts: Lucid without loosening of associations. No auditory or visual hallucinations. Denies suicidal or homicidal ideation. Alert and oriented x 4. Short and long-term memory: Intact. Judgment and insight: Appear to be adequate.   REVIEW OF SYSTEMS: Denies any physical complaints of pain. No cardiac, pulmonary, or GI complaints. No feeling of shakiness. No confusion, no hallucinations.   VITAL SIGNS:  Blood pressure 135/90, respirations 18, pulse 64, temperature 98.   ASSESSMENT: This is a 54 year old gentleman with a history of alcohol abuse. Currently not showing active signs of alcohol withdrawal. Has depressed mood, but no suicidality and no psychosis. We had held him overnight with the promise of trying to find a bed at the alcohol and drug abuse treatment center. No bed is available today, and there is no way to know if one would ever be forthcoming. At this point, there is no further benefit to the Emergency Room hospitalization. I proposed to let the patient be released from the hospital. He has been given counseling about substance abuse treatment and urged to go follow up with [facility], where he can see a counselor about his depression and his substance abuse. The case discussed with the Emergency Room doctor.   DIAGNOSIS, PRINCIPAL AND PRIMARY:  AXIS I: Alcohol abuse, severe.  SECONDARY DIAGNOSIS: AXIS I: Depression, not otherwise specified.  AXIS II: Anti-social (Dictation Anomaly)<<andMISSING TEXT>>dependent features.    ____________________________ Gonzella Lex, MD jtc:mw D: 01/20/2015 16:40:51 ET T: 01/20/2015 17:14:40 ET JOB#: [id number]  cc: Gonzella Lex, MD, <Dictator> Gonzella Lex MD ELECTRONICALLY SIGNED 02/04/2015 17:21

## 2015-04-26 NOTE — Consult Note (Signed)
Brief Consult Note: Diagnosis: Alcohol w/d, Alcohol use disorder severe.   Patient was seen by consultant.   Recommend further assessment or treatment.   Orders entered.   Comments: Pt seen in ED [facility]. He stated that he is having w/d symptoms including shakes and tremors. he was drinking from sun up to sun down. he was consuming alcohol, beer, vodka, everything. he stated that he is interested in going to [place] and has been to the rehab 3-4 times in past with longest sobriety of 7 months. He stated that he lives "here and there'. Has family around in the area. Appeared disheveled.   Plan: Pt is awaiting placement in [facility] He is going through w/d  and I will start him on Librium 25mg  po q6hrs and Haldol 0.5mg  po TID x 3 days Add Thiamine and Folic Acid.  Will continue to monitor.  Electronic Signatures: Jeronimo Norma (MD)  (Signed 19-Jan-16 10:37)  Authored: Brief Consult Note   Last Updated: 19-Jan-16 10:37 by Jeronimo Norma (MD)

## 2015-04-26 NOTE — Consult Note (Signed)
PATIENT NAME:  Darrell [name], Darrell [name] MR#:  [id number] DATE OF BIRTH:  16-May-1961  DATE OF CONSULTATION:  01/09/2015  REFERRING PHYSICIAN:   CONSULTING PHYSICIAN:  Gonzella Lex, MD  IDENTIFYING INFORMATION AND REASON FOR CONSULTATION: This is a 54 year old male with long history of alcohol abuse who presents to the hospital voluntarily.   CHIEF COMPLAINT: "I want detoxed off this alcohol."   HISTORY OF PRESENT ILLNESS: Information obtained from the patient and the chart. The patient presented to the hospital last night with a blood alcohol level of 304. He tells me that he has been drinking about a fifth of liquor a day and has been doing that on and off for the last couple of weeks. Smokes a little bit of marijuana, but mostly just drinks. Mood has been feeling down and low. Sleep mostly consists of passing out. Has not been eating well, not taking care of his health. He tells me that occasionally he has had some suicidal likes thoughts, but never developed any plan or intention and has no plan or intent to try and hurt or kill himself now. Denies that he is having any hallucinations. No particular new stresses. He is chronically homeless. Lives on the streets or in the woods.   PAST PSYCHIATRIC HISTORY: Long history of alcohol abuse. Multiple detoxes, multiple psychiatric hospitalizations, very poor history of follow-up. Typically does not go to outpatient treatment. Says he has been able to be sober for up to 6 months, but it has been a few years since then. Does not take any medication regularly.   PAST MEDICAL HISTORY: Hepatitis C for which he takes no medication. No other known ongoing medical problems.   SOCIAL HISTORY: [place] the woods. 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 Says he has family, but does not stay in touch with them and cannot live with them. No social support.   FAMILY HISTORY: The patient does not give any family history.   SUBSTANCE ABUSE HISTORY: He denies ever having had a  seizure from alcohol withdrawal. Denies any history of delirium tremens.   REVIEW OF SYSTEMS: Mostly feels tired. Run down. Mood is a little dysphoric. No acute suicidal ideation. No hallucinations. The rest of the review of systems unremarkable or negative.   MENTAL STATUS EXAMINATION: Disheveled gentleman who looks his stated age, very tired, minimal eye contact. Minimal Speech. Minimal psychomotor activity. Slow speech and only a little bit of it. Affect blunted. Mood stated as not good. Thoughts are very slow, but lucid. No evidence of delusions. Denies hallucinations. Denies suicidal or homicidal ideation currently. Alert and oriented to being in the hospital, not the correct month. Not able to remember any of 3 words at three minutes. Judgment and insight chronically some impairment from alcohol use. Intelligence average to low average.   LABORATORY RESULTS: Salicylates and acetaminophen negative. Alcohol 304 last night. Chemistry panel with elevated AST 75, elevated ALT 79. Protein and albumin normal. CBC normal. Urinalysis unremarkable. Drug screen positive for cannabis and benzodiazepines.   VITAL SIGNS: Blood pressure 124/77, respirations 20, pulse 102, temperature 98.2.   ASSESSMENT: A 54 year old man with alcohol dependence, presents intoxicated. Still sleeping it off. Remarkably he seems to be in pretty good physical shape as demonstrated by his labs and vital signs right now. Not suicidal, not psychotic. The patient has not followed up with outpatient treatment and has been given multiple opportunities in the past. He was recently at [facility] which is why he has not been referred back  there. The patient does not require inpatient hospitalization.   TREATMENT PLAN: Encouraged the patient to follow up with outpatient resources for substance abuse and stop drinking. Once he has sobered up enough to get up and stand, I think he can be released from the Emergency Room. He is not under involuntary  commitment.   DIAGNOSIS, PRINCIPAL AND PRIMARY:  AXIS I: Alcohol abuse, severe.   SECONDARY DIAGNOSES: AXIS I: Marijuana abuse, mild.  AXIS II: Deferred.  AXIS III: Hepatitis C. ____________________________ Gonzella Lex, MD jtc:sb D: 01/09/2015 17:17:56 ET T: 01/09/2015 17:24:14 ET JOB#: [id number]  cc: Gonzella Lex, MD, <Dictator> Gonzella Lex MD ELECTRONICALLY SIGNED 02/04/2015 17:18

## 2015-05-07 ENCOUNTER — Observation Stay (HOSPITAL_COMMUNITY)
Admission: EM | Admit: 2015-05-07 | Discharge: 2015-05-08 | Disposition: A | Discharge status: Home / Self Care | Payer: BLUE CROSS/BLUE SHIELD | Attending: Internal Medicine | Admitting: Internal Medicine

## 2015-05-07 ENCOUNTER — Emergency Department (HOSPITAL_COMMUNITY): Payer: BLUE CROSS/BLUE SHIELD

## 2015-05-07 ENCOUNTER — Encounter (HOSPITAL_COMMUNITY): Payer: Self-pay | Admitting: Emergency Medicine

## 2015-05-07 DIAGNOSIS — F1910 Other psychoactive substance abuse, uncomplicated: Secondary | ICD-10-CM | POA: Diagnosis present

## 2015-05-07 DIAGNOSIS — Z720 Tobacco use: Secondary | ICD-10-CM | POA: Insufficient documentation

## 2015-05-07 DIAGNOSIS — I2510 Atherosclerotic heart disease of native coronary artery without angina pectoris: Secondary | ICD-10-CM

## 2015-05-07 DIAGNOSIS — R079 Chest pain, unspecified: Principal | ICD-10-CM

## 2015-05-07 DIAGNOSIS — Z79899 Other long term (current) drug therapy: Secondary | ICD-10-CM | POA: Insufficient documentation

## 2015-05-07 DIAGNOSIS — I10 Essential (primary) hypertension: Secondary | ICD-10-CM | POA: Diagnosis not present

## 2015-05-07 DIAGNOSIS — R740 Nonspecific elevation of levels of transaminase and lactic acid dehydrogenase [LDH]: Secondary | ICD-10-CM | POA: Diagnosis not present

## 2015-05-07 DIAGNOSIS — F10129 Alcohol abuse with intoxication, unspecified: Secondary | ICD-10-CM

## 2015-05-07 DIAGNOSIS — Z8611 Personal history of tuberculosis: Secondary | ICD-10-CM | POA: Insufficient documentation

## 2015-05-07 DIAGNOSIS — G894 Chronic pain syndrome: Secondary | ICD-10-CM

## 2015-05-07 DIAGNOSIS — R7401 Elevation of levels of liver transaminase levels: Secondary | ICD-10-CM | POA: Diagnosis present

## 2015-05-07 DIAGNOSIS — F1914 Other psychoactive substance abuse with psychoactive substance-induced mood disorder: Secondary | ICD-10-CM

## 2015-05-07 DIAGNOSIS — F419 Anxiety disorder, unspecified: Secondary | ICD-10-CM | POA: Insufficient documentation

## 2015-05-07 DIAGNOSIS — F10920 Alcohol use, unspecified with intoxication, uncomplicated: Secondary | ICD-10-CM

## 2015-05-07 DIAGNOSIS — Z8619 Personal history of other infectious and parasitic diseases: Secondary | ICD-10-CM | POA: Insufficient documentation

## 2015-05-07 DIAGNOSIS — I252 Old myocardial infarction: Secondary | ICD-10-CM | POA: Insufficient documentation

## 2015-05-07 DIAGNOSIS — F17210 Nicotine dependence, cigarettes, uncomplicated: Secondary | ICD-10-CM

## 2015-05-07 DIAGNOSIS — G8929 Other chronic pain: Secondary | ICD-10-CM | POA: Insufficient documentation

## 2015-05-07 DIAGNOSIS — Z792 Long term (current) use of antibiotics: Secondary | ICD-10-CM | POA: Insufficient documentation

## 2015-05-07 DIAGNOSIS — E781 Pure hyperglyceridemia: Secondary | ICD-10-CM | POA: Diagnosis present

## 2015-05-07 DIAGNOSIS — Z9861 Coronary angioplasty status: Secondary | ICD-10-CM | POA: Insufficient documentation

## 2015-05-07 DIAGNOSIS — F17200 Nicotine dependence, unspecified, uncomplicated: Secondary | ICD-10-CM | POA: Diagnosis present

## 2015-05-07 DIAGNOSIS — E872 Acidosis: Secondary | ICD-10-CM | POA: Diagnosis not present

## 2015-05-07 DIAGNOSIS — R768 Other specified abnormal immunological findings in serum: Secondary | ICD-10-CM | POA: Diagnosis present

## 2015-05-07 DIAGNOSIS — F1010 Alcohol abuse, uncomplicated: Secondary | ICD-10-CM | POA: Diagnosis present

## 2015-05-07 DIAGNOSIS — Z7901 Long term (current) use of anticoagulants: Secondary | ICD-10-CM | POA: Insufficient documentation

## 2015-05-07 DIAGNOSIS — Z9119 Patient's noncompliance with other medical treatment and regimen: Secondary | ICD-10-CM | POA: Insufficient documentation

## 2015-05-07 DIAGNOSIS — F10120 Alcohol abuse with intoxication, uncomplicated: Secondary | ICD-10-CM | POA: Insufficient documentation

## 2015-05-07 DIAGNOSIS — Z7982 Long term (current) use of aspirin: Secondary | ICD-10-CM | POA: Insufficient documentation

## 2015-05-07 LAB — HEPATIC FUNCTION PANEL
ALT: 51 U/L (ref 17–63)
AST: 45 U/L — ABNORMAL HIGH (ref 15–41)
Albumin: 3.7 g/dL (ref 3.5–5.0)
Alkaline Phosphatase: 72 U/L (ref 38–126)
Bilirubin, Direct: 0.2 mg/dL (ref 0.1–0.5)
Indirect Bilirubin: 0.2 mg/dL — ABNORMAL LOW (ref 0.3–0.9)
TOTAL PROTEIN: 6.9 g/dL (ref 6.5–8.1)
Total Bilirubin: 0.4 mg/dL (ref 0.3–1.2)

## 2015-05-07 LAB — URINALYSIS, ROUTINE W REFLEX MICROSCOPIC
BILIRUBIN URINE: NEGATIVE
GLUCOSE, UA: NEGATIVE mg/dL
Hgb urine dipstick: NEGATIVE
Ketones, ur: NEGATIVE mg/dL
Leukocytes, UA: NEGATIVE
Nitrite: NEGATIVE
PH: 5 (ref 5.0–8.0)
Protein, ur: NEGATIVE mg/dL
Specific Gravity, Urine: 1.011 (ref 1.005–1.030)
Urobilinogen, UA: 0.2 mg/dL (ref 0.0–1.0)

## 2015-05-07 LAB — LIPID PANEL
Cholesterol: 157 mg/dL (ref 0–200)
HDL: 45 mg/dL (ref >40)
LDL Cholesterol: 73 mg/dL (ref 0–99)
TRIGLYCERIDES: 195 mg/dL — AB (ref <150)
Total CHOL/HDL Ratio: 3.5 RATIO
VLDL: 39 mg/dL (ref 0–40)

## 2015-05-07 LAB — BASIC METABOLIC PANEL
ANION GAP: 13 (ref 5–15)
BUN: <5 mg/dL — ABNORMAL LOW (ref 6–20)
CALCIUM: 8.6 mg/dL — AB (ref 8.9–10.3)
CO2: 20 mmol/L — ABNORMAL LOW (ref 22–32)
CREATININE: 1.09 mg/dL (ref 0.61–1.24)
Chloride: 107 mmol/L (ref 101–111)
GFR calc Af Amer: >60 mL/min (ref >60)
GFR calc non Af Amer: >60 mL/min (ref >60)
Glucose, Bld: 99 mg/dL (ref 65–99)
Potassium: 4 mmol/L (ref 3.5–5.1)
Sodium: 140 mmol/L (ref 135–145)

## 2015-05-07 LAB — ETHANOL: ALCOHOL ETHYL (B): 293 mg/dL — AB (ref <5)

## 2015-05-07 LAB — I-STAT TROPONIN, ED: TROPONIN I, POC: 0 ng/mL (ref 0.00–0.08)

## 2015-05-07 LAB — PROTIME-INR
INR: 1.05 (ref 0.00–1.49)
Prothrombin Time: 13.9 seconds (ref 11.6–15.2)

## 2015-05-07 LAB — HIV ANTIBODY (ROUTINE TESTING W REFLEX): HIV Screen 4th Generation wRfx: NONREACTIVE

## 2015-05-07 LAB — CBC
HCT: 45.8 % (ref 39.0–52.0)
Hemoglobin: 15.6 g/dL (ref 13.0–17.0)
MCH: 30.5 pg (ref 26.0–34.0)
MCHC: 34.1 g/dL (ref 30.0–36.0)
MCV: 89.5 fL (ref 78.0–100.0)
PLATELETS: 331 10*3/uL (ref 150–400)
RBC: 5.12 MIL/uL (ref 4.22–5.81)
RDW: 15.2 % (ref 11.5–15.5)
WBC: 6.9 10*3/uL (ref 4.0–10.5)

## 2015-05-07 LAB — RAPID URINE DRUG SCREEN, HOSP PERFORMED
Amphetamines: NOT DETECTED
BARBITURATES: NOT DETECTED
BENZODIAZEPINES: NOT DETECTED
COCAINE: NOT DETECTED
OPIATES: NOT DETECTED
Tetrahydrocannabinol: POSITIVE — AB

## 2015-05-07 LAB — DIFFERENTIAL
Basophils Absolute: 0.1 10*3/uL (ref 0.0–0.1)
Basophils Relative: 1 % (ref 0–1)
Eosinophils Absolute: 0.2 10*3/uL (ref 0.0–0.7)
Eosinophils Relative: 2 % (ref 0–5)
LYMPHS PCT: 42 % (ref 12–46)
Lymphs Abs: 3 10*3/uL (ref 0.7–4.0)
Monocytes Absolute: 0.4 10*3/uL (ref 0.1–1.0)
Monocytes Relative: 6 % (ref 3–12)
NEUTROS ABS: 3.5 10*3/uL (ref 1.7–7.7)
NEUTROS PCT: 49 % (ref 43–77)

## 2015-05-07 LAB — TROPONIN I
Troponin I: <0.03 ng/mL (ref <0.031)
Troponin I: <0.03 ng/mL (ref <0.031)

## 2015-05-07 LAB — SALICYLATE LEVEL

## 2015-05-07 LAB — LACTIC ACID, PLASMA
LACTIC ACID, VENOUS: 1.8 mmol/L (ref 0.5–2.0)
Lactic Acid, Venous: 2.1 mmol/L (ref 0.5–2.0)

## 2015-05-07 LAB — ACETAMINOPHEN LEVEL: Acetaminophen (Tylenol), Serum: <10 ug/mL — ABNORMAL LOW (ref 10–30)

## 2015-05-07 MED ORDER — ATORVASTATIN CALCIUM 20 MG PO TABS: 20.0000 mg | ORAL_TABLET | Freq: Every day | ORAL | Status: DC

## 2015-05-07 MED ORDER — NITROGLYCERIN 0.4 MG SL SUBL
0.4000 mg | SUBLINGUAL_TABLET | SUBLINGUAL | Status: DC | PRN
  Administered 2015-05-07 (×3): 0.4 mg via SUBLINGUAL
  Filled 2015-05-07: qty 1

## 2015-05-07 MED ORDER — THIAMINE HCL 100 MG/ML IJ SOLN: 100.0000 mg | Freq: Every day | INTRAMUSCULAR | Status: DC

## 2015-05-07 MED ORDER — DOUBLE ANTIBIOTIC 500-10000 UNIT/GM EX OINT
TOPICAL_OINTMENT | Freq: Two times a day (BID) | CUTANEOUS | Status: DC
  Filled 2015-05-07 (×18): qty 1

## 2015-05-07 MED ORDER — FOLIC ACID 1 MG PO TABS
1.0000 mg | ORAL_TABLET | Freq: Every day | ORAL | Status: DC
  Administered 2015-05-07 – 2015-05-08 (×2): 1 mg via ORAL
  Filled 2015-05-07 (×2): qty 1

## 2015-05-07 MED ORDER — LORAZEPAM 1 MG PO TABS: 0.0000 mg | ORAL_TABLET | Freq: Four times a day (QID) | ORAL | Status: DC

## 2015-05-07 MED ORDER — LORAZEPAM 1 MG PO TABS: 1.0000 mg | ORAL_TABLET | Freq: Four times a day (QID) | ORAL | Status: DC | PRN

## 2015-05-07 MED ORDER — VITAMIN B-1 100 MG PO TABS
100.0000 mg | ORAL_TABLET | Freq: Every day | ORAL | Status: DC
  Administered 2015-05-07 – 2015-05-08 (×2): 100 mg via ORAL
  Filled 2015-05-07 (×2): qty 1

## 2015-05-07 MED ORDER — ASPIRIN 325 MG PO TABS
325.0000 mg | ORAL_TABLET | Freq: Once | ORAL | Status: AC
  Administered 2015-05-07: 325 mg via ORAL
  Filled 2015-05-07: qty 1

## 2015-05-07 MED ORDER — CLOPIDOGREL BISULFATE 75 MG PO TABS
75.0000 mg | ORAL_TABLET | Freq: Every day | ORAL | Status: DC
  Administered 2015-05-07 – 2015-05-08 (×2): 75 mg via ORAL
  Filled 2015-05-07 (×2): qty 1

## 2015-05-07 MED ORDER — ATORVASTATIN CALCIUM 40 MG PO TABS
40.0000 mg | ORAL_TABLET | Freq: Every day | ORAL | Status: DC
Start: 2015-05-07 — End: 2015-05-07

## 2015-05-07 MED ORDER — DOUBLE ANTIBIOTIC 500-10000 UNIT/GM EX OINT
TOPICAL_OINTMENT | Freq: Two times a day (BID) | CUTANEOUS | Status: DC
  Administered 2015-05-07: 16:00:00 via TOPICAL
  Administered 2015-05-07: 1 via TOPICAL
  Administered 2015-05-08: 11:00:00 via TOPICAL
  Filled 2015-05-07: qty 14.17

## 2015-05-07 MED ORDER — VITAMIN B-1 100 MG PO TABS: 100.0000 mg | ORAL_TABLET | Freq: Every day | ORAL | Status: DC

## 2015-05-07 MED ORDER — LORAZEPAM 1 MG PO TABS: 0.0000 mg | ORAL_TABLET | Freq: Two times a day (BID) | ORAL | Status: DC

## 2015-05-07 MED ORDER — LORAZEPAM 2 MG/ML IJ SOLN: 1.0000 mg | Freq: Four times a day (QID) | INTRAMUSCULAR | Status: DC | PRN

## 2015-05-07 MED ORDER — PANTOPRAZOLE SODIUM 40 MG PO TBEC
40.0000 mg | DELAYED_RELEASE_TABLET | Freq: Every day | ORAL | Status: DC
  Administered 2015-05-07 – 2015-05-08 (×2): 40 mg via ORAL
  Filled 2015-05-07 (×2): qty 1

## 2015-05-07 MED ORDER — ATORVASTATIN CALCIUM 40 MG PO TABS
40.0000 mg | ORAL_TABLET | Freq: Every day | ORAL | Status: DC
  Administered 2015-05-07: 40 mg via ORAL
  Filled 2015-05-07: qty 1

## 2015-05-07 MED ORDER — THIAMINE HCL 100 MG/ML IJ SOLN
Freq: Once | INTRAVENOUS | Status: AC
  Administered 2015-05-07: 10:00:00 via INTRAVENOUS
  Filled 2015-05-07: qty 1000

## 2015-05-07 MED ORDER — ACETAMINOPHEN 500 MG PO TABS: 500.0000 mg | ORAL_TABLET | Freq: Four times a day (QID) | ORAL | Status: DC | PRN

## 2015-05-07 MED ORDER — ADULT MULTIVITAMIN W/MINERALS CH
1.0000 | ORAL_TABLET | Freq: Every day | ORAL | Status: DC
  Administered 2015-05-07 – 2015-05-08 (×2): 1 via ORAL
  Filled 2015-05-07 (×2): qty 1

## 2015-05-07 MED ORDER — ASPIRIN EC 81 MG PO TBEC
81.0000 mg | DELAYED_RELEASE_TABLET | Freq: Every day | ORAL | Status: DC
  Administered 2015-05-08: 81 mg via ORAL
  Filled 2015-05-07: qty 1

## 2015-05-07 MED ORDER — ENOXAPARIN SODIUM 40 MG/0.4ML ~~LOC~~ SOLN
40.0000 mg | Freq: Every day | SUBCUTANEOUS | Status: DC
  Administered 2015-05-07 – 2015-05-08 (×2): 40 mg via SUBCUTANEOUS
  Filled 2015-05-07 (×2): qty 0.4

## 2015-05-07 MED ORDER — FOLIC ACID 1 MG PO TABS: 1.0000 mg | ORAL_TABLET | Freq: Every day | ORAL | Status: DC

## 2015-05-07 MED ORDER — ADULT MULTIVITAMIN W/MINERALS CH: 1.0000 | ORAL_TABLET | Freq: Every day | ORAL | Status: DC

## 2015-05-07 MED ORDER — LORAZEPAM 2 MG/ML IJ SOLN
1.0000 mg | Freq: Three times a day (TID) | INTRAMUSCULAR | Status: DC
  Administered 2015-05-07 – 2015-05-08 (×2): 1 mg via INTRAVENOUS
  Filled 2015-05-07 (×2): qty 1

## 2015-05-07 MED ORDER — SODIUM CHLORIDE 0.9 % IV SOLN
INTRAVENOUS | Status: DC
  Administered 2015-05-07 – 2015-05-08 (×2): via INTRAVENOUS

## 2015-05-07 MED ORDER — SODIUM CHLORIDE 0.9 % IJ SOLN
3.0000 mL | Freq: Two times a day (BID) | INTRAMUSCULAR | Status: DC
  Administered 2015-05-07 – 2015-05-08 (×2): 3 mL via INTRAVENOUS

## 2015-05-07 MED ORDER — LORAZEPAM 1 MG PO TABS
1.0000 mg | ORAL_TABLET | Freq: Four times a day (QID) | ORAL | Status: DC | PRN
  Administered 2015-05-07 – 2015-05-08 (×2): 1 mg via ORAL
  Filled 2015-05-07 (×2): qty 1

## 2015-05-07 NOTE — ED Provider Notes (Signed)
CSN: [id number]     Arrival date & time 05/07/15  0326 History   First MD Initiated Contact with Patient 05/07/15 0423     Chief Complaint  Patient presents with  . Chest Pain     (Consider location/radiation/quality/duration/timing/severity/associated sxs/prior Treatment) Patient is a 54 y.o. male presenting with chest pain. The history is provided by the patient.  Chest Pain History is difficult because he is clinically intoxicated. He apparently had an episode of chest heaviness tonight. He has been having episodes intermittently for several years since having heart attack in 2014 - he also had a stent placed at that time. He rates the heavy feeling at about 5/10. Is associated dyspnea and diaphoresis but no nausea. He states he frequently does have nausea when he wakes up in the morning. He is supposed to be on aspirin and Plavix but admits to being noncompliant. He admits to drinking heavily and states that he wishes help getting off of alcohol. One every stops drinking, he gets shaky and gets hot flashes. He does admit to tobacco use and marijuana use.  Past Medical History  Diagnosis Date  . Hepatitis C   . Tuberculosis   . Myocardial infarction   . Anxiety   . Chronic pain   . Alcohol abuse   . History of heroin abuse    Past Surgical History  Procedure Laterality Date  . Coronary stent placement     Family History  Problem Relation Age of Onset  . Family history unknown: Yes   History  Substance Use Topics  . Smoking status: Current Every Day Smoker -- 2.00 packs/day    Types: Cigarettes  . Smokeless tobacco: Never Used     Comment: trying to quit  . Alcohol Use: Yes    Review of Systems  Cardiovascular: Positive for chest pain.  All other systems reviewed and are negative.     Allergies  Review of patient's allergies indicates no known allergies.  Home Medications   Prior to Admission medications   Medication Sig Start Date End Date Taking? Authorizing  Provider  acetaminophen (TYLENOL) 325 MG tablet Take 650 mg by mouth every 6 (six) hours as needed for moderate pain.    Historical Provider, MD  aspirin EC 81 MG EC tablet Take 1 tablet (81 mg total) by mouth daily. 09/24/14   Kathie Dike, MD  cephALEXin (KEFLEX) 500 MG capsule Take 1 capsule (500 mg total) by mouth 3 (three) times daily. 03/08/15   Evelina Bucy, MD  clopidogrel (PLAVIX) 75 MG tablet Take 1 tablet (75 mg total) by mouth daily before breakfast. 09/23/14   Kathie Dike, MD  sulfamethoxazole-trimethoprim (BACTRIM DS,SEPTRA DS) 800-160 MG per tablet Take 1 tablet by mouth once. 03/08/15   Evelina Bucy, MD   BP 150/108 mmHg  Pulse 104  Temp(Src) 98.3 F (36.8 C) (Oral)  Resp 17  Ht 5\' 10"  (1.778 m)  Wt 185 lb (83.915 kg)  BMI 26.54 kg/m2  SpO2 92% Physical Exam  Nursing note and vitals reviewed.  54 year old male, resting comfortably and in no acute distress. Vital signs are significant for hypertension and tachycardia. Oxygen saturation is 92%, which is normal. Head is normocephalic and atraumatic. PERRLA, EOMI. Oropharynx is clear. Neck is nontender and supple without adenopathy or JVD. Back is nontender and there is no CVA tenderness. Lungs are clear without rales, wheezes, or rhonchi. Chest is nontender. Heart has regular rate and rhythm without murmur. Abdomen is soft, flat, nontender without masses  or hepatosplenomegaly and peristalsis is normoactive. Extremities have no cyanosis or edema, full range of motion is present. Skin is warm and dry without rash. Neurologic: Mental status is normal, cranial nerves are intact, there are no motor or sensory deficits.  ED Course  Procedures (including critical care time) Labs Review Results for orders placed or performed during the hospital encounter of 58/59/29  Basic metabolic panel  Result Value Ref Range   Sodium 140 135 - 145 mmol/L   Potassium 4.0 3.5 - 5.1 mmol/L   Chloride 107 101 - 111 mmol/L   CO2 20 (L) 22  - 32 mmol/L   Glucose, Bld 99 65 - 99 mg/dL   BUN <5 (L) 6 - 20 mg/dL   Creatinine, Ser 1.09 0.61 - 1.24 mg/dL   Calcium 8.6 (L) 8.9 - 10.3 mg/dL   GFR calc non Af Amer >60 >60 mL/min   GFR calc Af Amer >60 >60 mL/min   Anion gap 13 5 - 15  CBC  Result Value Ref Range   WBC 6.9 4.0 - 10.5 K/uL   RBC 5.12 4.22 - 5.81 MIL/uL   Hemoglobin 15.6 13.0 - 17.0 g/dL   HCT 45.8 39.0 - 52.0 %   MCV 89.5 78.0 - 100.0 fL   MCH 30.5 26.0 - 34.0 pg   MCHC 34.1 30.0 - 36.0 g/dL   RDW 15.2 11.5 - 15.5 %   Platelets 331 150 - 400 K/uL  Differential  Result Value Ref Range   Neutrophils Relative % 49 43 - 77 %   Neutro Abs 3.5 1.7 - 7.7 K/uL   Lymphocytes Relative 42 12 - 46 %   Lymphs Abs 3.0 0.7 - 4.0 K/uL   Monocytes Relative 6 3 - 12 %   Monocytes Absolute 0.4 0.1 - 1.0 K/uL   Eosinophils Relative 2 0 - 5 %   Eosinophils Absolute 0.2 0.0 - 0.7 K/uL   Basophils Relative 1 0 - 1 %   Basophils Absolute 0.1 0.0 - 0.1 K/uL  Hepatic function panel  Result Value Ref Range   Total Protein 6.9 6.5 - 8.1 g/dL   Albumin 3.7 3.5 - 5.0 g/dL   AST 45 (H) 15 - 41 U/L   ALT 51 17 - 63 U/L   Alkaline Phosphatase 72 38 - 126 U/L   Total Bilirubin 0.4 0.3 - 1.2 mg/dL   Bilirubin, Direct 0.2 0.1 - 0.5 mg/dL   Indirect Bilirubin 0.2 (L) 0.3 - 0.9 mg/dL  Ethanol  Result Value Ref Range   Alcohol, Ethyl (B) 293 (H) <5 mg/dL  I-stat troponin, ED  (not at [facility])  Result Value Ref Range   Troponin i, poc 0.00 0.00 - 0.08 ng/mL   Comment 3           Imaging Review Dg Chest 2 View  05/07/2015   CLINICAL DATA:  Left-sided chest pain and left arm and hand numbness tonight.  EXAM: CHEST  2 VIEW  COMPARISON:  01/15/2015  FINDINGS: Normal heart size and pulmonary vascularity. No focal airspace disease or consolidation in the lungs. No blunting of costophrenic angles. No pneumothorax. Mediastinal contours appear intact. Degenerative changes in the spine. Chronic left acromioclavicular separation.  IMPRESSION:  No active cardiopulmonary disease.   Electronically Signed   By: Lucienne Capers M.D.   On: 05/07/2015 05:29     EKG Interpretation   Date/Time:  Thursday May 07 2015 03:30:18 EDT Ventricular Rate:  103 PR Interval:  129 QRS Duration:  91 QT Interval:  358 QTC Calculation: 469 R Axis:     Text Interpretation:  Sinus tachycardia Probable left atrial enlargement  When compared with ECG of 02/21/2015, Nonspecific T wave abnormality is no  longer Present Confirmed by [facility]  MD, Taysom Glymph ([id number]) on 05/07/2015 3:37:07  AM      MDM   Final diagnoses:  Chest pain, unspecified chest pain type  Alcohol intoxication, uncomplicated    Chest discomfort in patient with known coronary artery disease. ECG is unremarkable but I am concerned about noncompliance with antiplatelet regimen. Clinical intoxication with alcohol abuse. I anticipate he will need admission for serial troponins to rule out MI and will need consultation is [facility] can get appropriate referral to a detox program. He'll be started on CIWA protocol.  Alcohol is come back significantly elevated at 293. CO2 is slightly low at 20 but with a normal anion gap. Patient is admitted for serial troponins and will be maintained on CIWA protocol. Attempts will be made to place him in appropriate detox program. Case is discussed with internal medicine teaching service who agrees to admit the patient.  Delora Fuel, MD 59/97/74 [id number]

## 2015-05-07 NOTE — ED Notes (Signed)
Pt also wants rehab for etoh.
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
## 2015-05-07 NOTE — Progress Notes (Addendum)
Pt complaint of 5/10 mid chest pain.  Dr. Aundra Dubin in pt room and instructed me to give sublingual Nitro.  Pt pain not relieve after 3 sublingual nitro and Dr. Aundra Dubin aware of this. Pt requested to have morphine but Dr. Aundra Dubin ordered scheduled Ativan.  Will continue to closely monitor.

## 2015-05-07 NOTE — Progress Notes (Signed)
Sent a fax to obtain medical records from [facility]), waiting for fax return of medical records.

## 2015-05-07 NOTE — Progress Notes (Signed)
Records from the outside hospital reviewed and summarized below. Of note, he has records and nearby institutions in [place].  10/12/13: Admitted to [facility] in [place], [place] for inferior wall STEMI after which he underwent bare-metal stenting of RCA and was started on aspirin and Plavix  10/14/13: Left AMA and re-presented to [facility] within an hour with chest pain; found to have elevated troponin of 10.988 and was to be transferred back to [facility]   12/22/14-12/23/14: Hospitalized at [facility] for acute chest pain at which time Myoview had abnormal results [see below]. Cardiology was consulted at that time and did not recommend catheterization given his prior history of nonadherence to treatment.  1. Limited CT scan for attenuation correction shows coronary artery disease. It also shows small pulmonary nodules. Recommend chest CT   2. Study degraded by motion artifact.   3. Evidence for myocardial infarction involving the inferior segment near the base. Reversible ischemia involving this inferior segment towards the apex.   4. Evidence for reversible ischemia involving the apex, anterior segment and lateral segments. Degree of ischemia in the anterior and lateral segments is mild.

## 2015-05-07 NOTE — Consult Note (Signed)
CARDIOLOGY CONSULT NOTE   Patient ID: Darrell [name] MRN: [id number] DOB/AGE: 54/19/1962 54 y.o.  Admit date: 05/07/2015  Primary Physician   No PCP Per Patient Primary Cardiologist   New Reason for Consultation  Chest pain, Should pt be on BB?  ITG:PQDIYME Darrell [name] is Darrell 54 y.o. year old male with Darrell history of stent 2014 in [place], tob use, ETOH abuse, polypharmacy drug abuse including cocaine, THC, meth and heroin, noncompliance w/ meds who was admitted 05/12 with chest pain, mild elevation in lactic acid level. Pt wants ETOH rehab. Cardiology was asked to evaluate him to see if he would be Darrell candidate for beta blocker.  Darrell [name] currently denies chest pain. He is very sleepy during the interview and has to constantly be aroused to answer questions. When roused, he answers appropriately.  Darrell. [name] states he cannot remember how many episodes of chest pain he has had. He describes it as Darrell tightness and Darrell pressure. Cannot rate the severity of it. He thinks he gets Darrell little short of breath with it at times. He cannot say if the chest pain is worse with deep inspiration or not. He does not remember aggravating or alleviating factors.   According to the admission H&P, he was having some dyspnea on exertion and nausea Darrell couple of days ago.  As he is currently homeless, and living in Darrell tent, he has problems obtaining and affording his medications and is noncompliant.  According to his nurse, he was complaining of chest pain earlier at Darrell 5/10. He was given sublingual nitroglycerin 3 and denied any improvement. He requested morphine. He was given Ativan and that is the reason for him being very sleepy at this time.   Past Medical History  Diagnosis Date  . Hepatitis C   . Tuberculosis   . Myocardial infarction   . Anxiety   . Chronic pain   . Alcohol abuse   . History of heroin abuse      Past Surgical History  Procedure Laterality Date  . Coronary stent placement       No Known Allergies  I have reviewed the patient's current medications . [START ON 05/08/2015] aspirin EC  81 mg Oral Daily  . atorvastatin  40 mg Oral q1800  . clopidogrel  75 mg Oral QAC breakfast  . enoxaparin (LOVENOX) injection  40 mg Subcutaneous Daily  . folic acid  1 mg Oral Daily  . LORazepam  1 mg Intravenous Q8H  . multivitamin with minerals  1 tablet Oral Daily  . pantoprazole  40 mg Oral Daily  . polymixin-bacitracin   Topical BID  . sodium chloride  3 mL Intravenous Q12H  . thiamine  100 mg Oral Daily   Or  . thiamine  100 mg Intravenous Daily   . sodium chloride     acetaminophen, LORazepam **OR** LORazepam, nitroGLYCERIN  Prior to Admission medications   Medication Sig Start Date End Date Taking? Authorizing Provider  acetaminophen (TYLENOL) 325 MG tablet Take 650 mg by mouth every 6 (six) hours as needed for moderate pain.   Yes Historical Provider, MD  aspirin EC 81 MG EC tablet Take 1 tablet (81 mg total) by mouth daily. 09/24/14  Yes Darrell Dike, MD  atorvastatin (LIPITOR) 20 MG tablet Take 20 mg by mouth daily.   Yes Historical Provider, MD  clopidogrel (PLAVIX) 75 MG tablet Take 1 tablet (75 mg total) by mouth daily before breakfast. 09/23/14  Yes  Darrell Dike, MD     History   Social History  . Marital Status: Single    Spouse Name: N/Darrell  . Number of Children: N/Darrell  . Years of Education: N/Darrell   Occupational History  . Unemployed    Social History Main Topics  . Smoking status: Current Every Day Smoker -- 2.00 packs/day    Types: Cigarettes  . Smokeless tobacco: Never Used     Comment: trying to quit  . Alcohol Use: Yes     Comment: daily  alcohol   ale ,liquor  . Drug Use: Yes    Special: Marijuana  . Sexual Activity: Not on file   Other Topics Concern  . Not on file   Social History Narrative   Homeless    Smoking 1.5 ppd     Family Status  Relation Status Death Age  . Mother Deceased   . Maternal Grandmother Deceased     Family History  Problem Relation Age of Onset  . Heart attack      paternal uncle died MI 62   . Cancer      lung-maternal GM  . Cystic fibrosis      mom. died with pna  . Alcohol abuse      brother     ROS:  Full 14 point review of systems complete and found to be negative unless listed above.  Physical Exam: Blood pressure 127/86, pulse 88, temperature 98.8 F (37.1 C), temperature source Oral, resp. rate 12, height 5\' 10"  (1.778 m), weight 181 lb 1.6 oz (82.146 kg), SpO2 100 %.  General: Well developed, well nourished, male in no acute distress Head: Eyes PERRLA, No xanthomas.   Normocephalic and atraumatic, oropharynx without edema or exudate. Dentition: good  Lungs: clear bilaterally Heart: HRRR S1 S2, no rub/gallop, no murmur. pulses are 2+ all 4 extrem.   Neck: No carotid bruits. No lymphadenopathy.  JVD not elevated. Abdomen: Bowel sounds present, abdomen soft and non-tender without masses or hernias noted. Msk:  No spine or cva tenderness. No weakness, no joint deformities or effusions. Extremities: No clubbing or cyanosis. no edema.  Neuro: Sleepy but rousable and oriented X 3. No focal deficits noted. Psych:  Good affect, responds appropriately Skin: No rashes or lesions noted.  Labs:   Lab Results  Component Value Date   WBC 6.9 05/07/2015   HGB 15.6 05/07/2015   HCT 45.8 05/07/2015   MCV 89.5 05/07/2015   PLT 331 05/07/2015    Recent Labs  05/07/15 0757  INR 1.05     Recent Labs Lab 05/07/15 0412  NA 140  K 4.0  CL 107  CO2 20*  BUN <5*  CREATININE 1.09  CALCIUM 8.6*  PROT 6.9  BILITOT 0.4  ALKPHOS 72  ALT 51  AST 45*  GLUCOSE 99  ALBUMIN 3.7    Recent Labs  05/07/15 0757  TROPONINI <0.03    Recent Labs  05/07/15 0437  TROPIPOC 0.00   Lab Results  Component Value Date   CHOL 157 05/07/2015   HDL 45 05/07/2015   LDLCALC 73 05/07/2015   TRIG 195* 05/07/2015             LACTIC ACID 2.1    Urinalysis    Component  Value Date/Time   COLORURINE YELLOW 05/07/2015 1002   APPEARANCEUR CLEAR 05/07/2015 1002   LABSPEC 1.011 05/07/2015 1002   PHURINE 5.0 05/07/2015 1002   GLUCOSEU NEGATIVE 05/07/2015 1002   [place] 05/07/2015 1002  BILIRUBINUR NEGATIVE 05/07/2015 1002   KETONESUR NEGATIVE 05/07/2015 1002   PROTEINUR NEGATIVE 05/07/2015 1002   UROBILINOGEN 0.2 05/07/2015 1002   NITRITE NEGATIVE 05/07/2015 1002   LEUKOCYTESUR NEGATIVE 05/07/2015 1002   Drugs of Abuse     Component Value Date/Time   LABOPIA NONE DETECTED 05/07/2015 1002   COCAINSCRNUR NONE DETECTED 05/07/2015 1002   LABBENZ NONE DETECTED 05/07/2015 1002   AMPHETMU NONE DETECTED 05/07/2015 1002   THCU POSITIVE* 05/07/2015 1002   LABBARB NONE DETECTED 05/07/2015 1002    ECG: 05/07/2015 SR, Q waves inferior and septal leads  Radiology:  Dg Chest 2 View 05/07/2015   CLINICAL DATA:  Left-sided chest pain and left arm and hand numbness tonight.  EXAM: CHEST  2 VIEW  COMPARISON:  01/15/2015  FINDINGS: Normal heart size and pulmonary vascularity. No focal airspace disease or consolidation in the lungs. No blunting of costophrenic angles. No pneumothorax. Mediastinal contours appear intact. Degenerative changes in the spine. Chronic left acromioclavicular separation.  IMPRESSION: No active cardiopulmonary disease.   Electronically Signed   By: Lucienne Capers M.D.   On: 05/07/2015 05:29    ASSESSMENT AND PLAN:   The patient was seen today by Dr Percival Spanish, the patient evaluated and the data reviewed.  Principal Problem:   Chest pain - in the setting of drug abuse, ETOH, LSD, and energy drinks just prior to admission - ez negative for MI - ECGs reviewed, septal changes are new - SL NTG x 3 given this am for chest pain, no apparent response, requested MSO4 but did not get any, symptoms resolved. - He has been restarted on ASA, Plavix and statin - UDS this admit + only for [facility] - Agree with restarting aspirin and statin. M.D. advise if  restarting Plavix is indicated since he is greater than one year out from stenting and compliance is such an issue. - As his UDS was negative for cocaine and he is currently in Darrell controlled environment, could add Coreg at Darrell low dose. However, once he is not under close supervision, feel that compliance and additional drug use will be an issue, so do not think he is Darrell good candidate for long-term beta blocker usage. - M.D. advise if further testing or evaluation indicated. Consider an echo to assess his EF - Await records from [place].  Otherwise, per IM. Active Problems:   Hypertriglyceridemia   Transaminitis   Tobacco use disorder   Hepatitis C antibody test positive   Alcohol abuse   Pain in the chest   Substance abuse   Alcohol abuse with intoxication   Signed: Lenoard Aden 05/07/2015 11:51 AM Beeper [phone]  Co-Sign MD  History and all data above reviewed.  Patient examined.  I agree with the findings as above.  The patient presented to the hospital for alcohol detox.  He reports Darrell past history of CAD but we don't have records.  He has been off of meds for the most part for the past year or so.  He does have chronic chest pain at times.  He has had this off an on this admit.  He has not had objective evidence of ischemia.  His enzymes are still being cycled.  He says that he walks about 10 miles per day and this does not bring on any symptoms  EKG does not show ischemia although he has Darrell borderline QT prolongation.  The patient exam reveals COR:RRR  ,  Lungs: Clear  ,  Abd: Positive bowel sounds, no  rebound no guarding, Ext No edema  .  All available labs, radiology testing, previous records reviewed. Agree with documented assessment and plan. Chest pain:  No objective evidence of ischemia and this sounds like it has been Darrell stable pattern.  He does not need to take Plavix as it has been greater than 12 months since apparent stenting in [place].  I think he will be compliant with  ASA.  I would not suggest Darrell beta blocker   Minus Breeding  2:13 PM  05/07/2015

## 2015-05-07 NOTE — ED Notes (Signed)
Called out at drive through at [employer] pressing his face on the screen, was asked to leave and at this time patient requested an ambulance because of chest pain.  States he has had cp that is dull and sharp for about a week.  Patient reports energy drink/etoh and LSD consumption about and hour ago.  Has not had any Plavix, asa, cholesterol, hypertensive meds for about a week because he cannot afford them.  Patient is belligerently intoxicated.  Patient has been exteremly inappropriate with male Catering manager.  Hx of MI last year with stent, alcoholism, drug use, crystal meth and heroin use but denies recent use, hep c.  12 lead unremarkable. 140/90, HR: 110.

## 2015-05-07 NOTE — H&P (Signed)
CRITICAL VALUE ALERT  Critical value received:  Lactic Acid  Date of notification:  05/07/2015  Time of notification:  9:05   Critical value read back:yes  Nurse who received alert:  Josie Dixon  MD notified (1st page):  Dr. Posey Pronto  Time of first page:  9:06  Responding MD:  Dr. Posey Pronto  Time MD responded:  9:08

## 2015-05-07 NOTE — Care Management Note (Signed)
Case Management Note  Patient Details  Name: Darrell [name] MRN: [id number] Date of Birth: 09-16-1961  Subjective/Objective:     Referral for medications. CM is unable to assist with medications due to pt has insurance.                Action/Plan: CSW to provide resources for substance abuse and shelter resources. CM will continue to monitor for additional needs.   Expected Discharge Date:                  Expected Discharge Plan:     In-House Referral:     Discharge planning Services     Post Acute Care Choice:    Choice offered to:     DME Arranged:    DME Agency:     HH Arranged:    [place] Agency:     Status of Service:     Medicare Important Message Given:    Date Medicare IM Given:    Medicare IM give by:    Date Additional Medicare IM Given:    Additional Medicare Important Message give by:     If discussed at [place] of Stay Meetings, dates discussed:    Additional Comments:  Bethena Roys, RN 05/07/2015, 3:44 PM

## 2015-05-08 DIAGNOSIS — I2510 Atherosclerotic heart disease of native coronary artery without angina pectoris: Secondary | ICD-10-CM

## 2015-05-08 LAB — COMPREHENSIVE METABOLIC PANEL
ALK PHOS: 63 U/L (ref 38–126)
ALT: 38 U/L (ref 17–63)
ANION GAP: 9 (ref 5–15)
AST: 31 U/L (ref 15–41)
Albumin: 3.2 g/dL — ABNORMAL LOW (ref 3.5–5.0)
CALCIUM: 8.8 mg/dL — AB (ref 8.9–10.3)
CHLORIDE: 105 mmol/L (ref 101–111)
CO2: 22 mmol/L (ref 22–32)
Creatinine, Ser: 1 mg/dL (ref 0.61–1.24)
GLUCOSE: 87 mg/dL (ref 65–99)
Potassium: 3.9 mmol/L (ref 3.5–5.1)
SODIUM: 136 mmol/L (ref 135–145)
TOTAL PROTEIN: 6.1 g/dL — AB (ref 6.5–8.1)
Total Bilirubin: 0.9 mg/dL (ref 0.3–1.2)

## 2015-05-08 LAB — HEMOGLOBIN A1C
Hgb A1c MFr Bld: 5.8 % — ABNORMAL HIGH (ref 4.8–5.6)
Mean Plasma Glucose: 120 mg/dL

## 2015-05-08 LAB — MAGNESIUM: Magnesium: 1.9 mg/dL (ref 1.7–2.4)

## 2015-05-08 MED ORDER — ATORVASTATIN CALCIUM 40 MG PO TABS: 40.0000 mg | ORAL_TABLET | Freq: Every day | ORAL | Status: DC

## 2015-05-08 MED ORDER — ASPIRIN 81 MG PO TBEC: 81.0000 mg | DELAYED_RELEASE_TABLET | Freq: Every day | ORAL | Status: DC

## 2015-05-08 MED ORDER — NITROGLYCERIN 0.4 MG SL SUBL: 0.4000 mg | SUBLINGUAL_TABLET | SUBLINGUAL | Status: DC | PRN

## 2015-05-08 MED ORDER — DOUBLE ANTIBIOTIC 500-10000 UNIT/GM EX OINT: 1.0000 "application " | TOPICAL_OINTMENT | Freq: Two times a day (BID) | CUTANEOUS | Status: DC

## 2015-05-08 MED ORDER — AMLODIPINE BESYLATE 5 MG PO TABS: 5.0000 mg | ORAL_TABLET | Freq: Every day | ORAL | Status: DC

## 2015-05-08 NOTE — Discharge Instructions (Signed)
Thank you for trusting [place] with your medical care!  You were hospitalized for chest pain.   Please take note of the following changes to your medications: START aspirin 81mg , amlodipine 5mg , Lipitor 40mg  for your heart TAKE nitroglycerin tablets only when needed for chest pain CONTINUE the cream for your skin lesions  To make sure you are getting better, please make it to the follow-up appointments listed on the first page.  If you have any questions, please call [phone].
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
## 2015-05-08 NOTE — Progress Notes (Signed)
Patient removed telemetry and would not let this nurse put it back on him. Dr Charlott Rakes paged and made aware. Stated Ok to leave it off.

## 2015-05-08 NOTE — Progress Notes (Addendum)
Subjective: This morning, he is asleep though arousable on interview. He is relieved that he does not have any acute cardiac issues though is interested in this disposition.  Objective: Vital signs in last 24 hours: Filed Vitals:   05/08/15 0036 05/08/15 0616 05/08/15 0655 05/08/15 0830  BP: 175/119 160/107 156/108 159/106  Pulse: 76 77 66 76  Temp: 99 F (37.2 C) 98.7 F (37.1 C)    TempSrc: Oral Oral    Resp:      Height:      Weight:      SpO2: 100% 98%     Weight change: -3 lb 14.4 oz (-1.769 kg)  Intake/Output Summary (Last 24 hours) at 05/08/15 1154 Last data filed at 05/08/15 0659  Gross per 24 hour  Intake 1813.33 ml  Output    750 ml  Net 1063.33 ml   General: Caucasian male, resting in bed, NAD HEENT: PERRL, EOMI, no scleral icterus, oropharynx clear Cardiac: RRR, no rubs, murmurs or gallops Pulm: clear to auscultation bilaterally, no wheezes, rales, or rhonchi Abd: soft, nontender, nondistended, BS present Back: Multiple skin lesions noted though without drainage,  purulence, gross erythema Ext: warm and well perfused, no pedal or tibial edema Neuro: responds to questions appropriately; moving all extremities freely   Lab Results: Basic Metabolic Panel:  Recent Labs Lab 05/07/15 0412 05/08/15 0305  NA 140 136  K 4.0 3.9  CL 107 105  CO2 20* 22  GLUCOSE 99 87  BUN <5* <5*  CREATININE 1.09 1.00  CALCIUM 8.6* 8.8*  MG  --  1.9   Liver Function Tests:  Recent Labs Lab 05/07/15 0412 05/08/15 0305  AST 45* 31  ALT 51 38  ALKPHOS 72 63  BILITOT 0.4 0.9  PROT 6.9 6.1*  ALBUMIN 3.7 3.2*   CBC:  Recent Labs Lab 05/07/15 0412  WBC 6.9  NEUTROABS 3.5  HGB 15.6  HCT 45.8  MCV 89.5  PLT 331   Cardiac Enzymes:  Recent Labs Lab 05/07/15 0757 05/07/15 1355 05/07/15 1907  TROPONINI <0.03 <0.03 <0.03   Hemoglobin A1C:  Recent Labs Lab 05/07/15 0947  HGBA1C 5.8*   Fasting Lipid Panel:  Recent Labs Lab 05/07/15 0947  CHOL  157  HDL 45  LDLCALC 73  TRIG 195*  CHOLHDL 3.5   Coagulation:  Recent Labs Lab 05/07/15 0757  LABPROT 13.9  INR 1.05   Urine Drug Screen: Drugs of Abuse     Component Value Date/Time   LABOPIA NONE DETECTED 05/07/2015 1002   COCAINSCRNUR NONE DETECTED 05/07/2015 1002   LABBENZ NONE DETECTED 05/07/2015 1002   AMPHETMU NONE DETECTED 05/07/2015 1002   THCU POSITIVE* 05/07/2015 1002   LABBARB NONE DETECTED 05/07/2015 1002    Alcohol Level:  Recent Labs Lab 05/07/15 0615  ETH 293*   Urinalysis:  Recent Labs Lab 05/07/15 1002  COLORURINE YELLOW  LABSPEC 1.011  PHURINE 5.0  GLUCOSEU NEGATIVE  HGBUR NEGATIVE  BILIRUBINUR NEGATIVE  KETONESUR NEGATIVE  PROTEINUR NEGATIVE  UROBILINOGEN 0.2  NITRITE NEGATIVE  LEUKOCYTESUR NEGATIVE    Studies/Results: Dg Chest 2 View  05/07/2015   CLINICAL DATA:  Left-sided chest pain and left arm and hand numbness tonight.  EXAM: CHEST  2 VIEW  COMPARISON:  01/15/2015  FINDINGS: Normal heart size and pulmonary vascularity. No focal airspace disease or consolidation in the lungs. No blunting of costophrenic angles. No pneumothorax. Mediastinal contours appear intact. Degenerative changes in the spine. Chronic left acromioclavicular separation.  IMPRESSION: No active cardiopulmonary disease.  Electronically Signed   By: Lucienne Capers M.D.   On: 05/07/2015 05:29   Medications: I have reviewed the patient's current medications. Scheduled Meds: . aspirin EC  81 mg Oral Daily  . atorvastatin  40 mg Oral q1800  . clopidogrel  75 mg Oral QAC breakfast  . enoxaparin (LOVENOX) injection  40 mg Subcutaneous Daily  . folic acid  1 mg Oral Daily  . LORazepam  1 mg Intravenous Q8H  . multivitamin with minerals  1 tablet Oral Daily  . pantoprazole  40 mg Oral Daily  . polymixin-bacitracin   Topical BID  . sodium chloride  3 mL Intravenous Q12H  . thiamine  100 mg Oral Daily   Or  . thiamine  100 mg Intravenous Daily   Continuous  Infusions: . sodium chloride 100 mL/hr at 05/08/15 0651   PRN Meds:.acetaminophen, LORazepam **OR** LORazepam, nitroGLYCERIN Assessment/Plan:  Mr. Springfield is a 54 year old male with history of CAD status post bare metal stenting of RCA [October 0086] complicated by nonadherence to medications, hyperlipidemia, history of polysubstance abuse, reactive HCV antibody, who presented with chest pain and found to have multiple skin lesions.  Chest pain: Given his prior cardiac history, cardiac etiology could not be excluded on admission though his negative troponins and stable EKG findings are reassuring for no acute cardiac etiology. Additionally, cardiology did not recommend continuing Plavix or additional interventions.  -Discontinue telemetry -Consult care management for home medication needs for  Lipitor 40 mg, aspirin 81 mg  Polysubstance abuse: Self-reported heavy alcohol use daily and tobacco along with UDS findings for marijuana. Serum ethanol also elevated on admission. Further complicated by his living situation. -Follow-up social work recommendations -Advised smoking cessation -Continue multivitamin, thiamine  Reactive HCV antibody: Noted on records from outside hospital as well as this admission. HIV negative.  -Follow-up viral load  Multiple skin lesions: Noted to be overlying his back and chest area. Self-reported to be tick bites. -Continue Polysporin cream twice daily  GERD: Continue Protonix 40 mg daily.  #FEN:  -Diet: Heart healthy  #DVT prophylaxis: Lovenox  #CODE STATUS: FULL CODE  Dispo: Disposition is deferred at this time, awaiting improvement of current medical problems.  The patient does not have a current PCP (No Pcp Per Patient) and does not know need an [facility] hospital follow-up appointment after discharge.  The patient does have transportation limitations that hinder transportation to clinic appointments.  .Services Needed at time of discharge: Y = Yes, Blank  = No PT:   OT:   RN:   Equipment:   Other:       Riccardo Dubin, MD 05/08/2015, 11:54 AM  ------------------------------------------------------------------------ ADDENDUM Hypertension - Please see my note from [facility]. Discussed this with Dr Ronnald Ramp. Amlodipine 5 mg was added but the patient was discharged before repeat BP. Discussed with Dr Ronnald Ramp about follow up and reevaluation - as early as possible.   Madilyn Fireman MD MPH 05/08/2015 2:53 PM

## 2015-05-08 NOTE — Care Management Note (Signed)
Case Management Note  Patient Details  Name: Darrell [name] MRN: [id number] Date of Birth: 04/25/1961  Subjective/Objective:   Pt d/c - appointment set up for the [facility] via the Unit Secretary Jessica. No further needs from CM at this time.                Action/Plan:   Expected Discharge Date:                  Expected Discharge Plan:     In-House Referral:     Discharge planning Services     Post Acute Care Choice:    Choice offered to:     DME Arranged:    DME Agency:     HH Arranged:    [place] Agency:     Status of Service:     Medicare Important Message Given:    Date Medicare IM Given:    Medicare IM give by:    Date Additional Medicare IM Given:    Additional Medicare Important Message give by:     If discussed at [place] of Stay Meetings, dates discussed:    Additional Comments:  Bethena Roys, RN 05/08/2015, 2:08 PM

## 2015-05-08 NOTE — Clinical Social Work Note (Signed)
Clinical Social Work Assessment  Patient Details  Name: Darrell [name] MRN: [id number] Date of Birth: June 02, 1961  Date of referral:  05/08/15               Reason for consult:  Substance Use/ETOH Abuse, Housing Concerns/Homelessness                 Housing/Transportation Living arrangements for the past 2 months:  Homeless Source of Information:  Patient Patient Interpreter Needed:  None Criminal Activity/Legal Involvement Pertinent to Current Situation/Hospitalization:  No - Comment as needed Significant Relationships:  Adult Children Lives with:  Self, Other (Comment) (Homeless) Do you feel safe going back to the place where you live?  Yes Need for family participation in patient care:  No (Coment)  Care giving concerns:  Pt admits to being homeless and substance use   Social Worker assessment / plan:  CSW spoke with pt about living situation and completed SBIRT. Pt with limited participation in conversation but open to accepting resources. Pt only wanting to speak with CSW about ETOH abuse and no other substance use. Pt accepting of resources and states he has been to rehab program in the past. Pt states he was living on the street prior to admission. Pt aware of homeless shelters in the area and accept [employer] for meals. Pt potentially needing assistance with transport at dc. CSW informed pt that if this is the case CSW may be able to provide with bus pass. At this time, pt has no further hospital social work needs. CSW signing off.  Employment status:    Insurance information:  Managed Care PT Recommendations:  Not assessed at this time Information / Referral to community resources:  SBIRT, Shelter, Residential Substance Abuse Treatment Options, Outpatient Substance Abuse Treatment Options  Patient/Family's Response to care:  Pt in agreement that substance use is impacting his health. He states he is considering quitting at this time.  Patient/Family's Understanding of and  Emotional Response to Diagnosis, Current Treatment, and Prognosis:  Difficult to assess as pt only participated minimally in assessment. Pt with limited eye contact through out and stated he was tired.   Emotional Assessment Appearance:  Disheveled Attitude/Demeanor/Rapport:  Avoidant Affect (typically observed):  Flat Orientation:  Oriented to Self, Oriented to Place, Oriented to  Time, Oriented to Situation Alcohol / Substance use:  Alcohol Use Psych involvement (Current and /or in the community):  No (Comment)  Discharge Needs  Concerns to be addressed:  Homelessness, Substance Abuse Concerns Readmission within the last 30 days:  No Current discharge risk:  Homeless Barriers to Discharge:  Continued Medical Work up   [employer], [place]

## 2015-05-10 ENCOUNTER — Emergency Department (HOSPITAL_COMMUNITY)
Admission: EM | Admit: 2015-05-10 | Discharge: 2015-05-10 | Disposition: A | Discharge status: Home / Self Care | Payer: BLUE CROSS/BLUE SHIELD | Attending: Emergency Medicine | Admitting: Emergency Medicine

## 2015-05-10 ENCOUNTER — Encounter (HOSPITAL_COMMUNITY): Payer: Self-pay | Admitting: Emergency Medicine

## 2015-05-10 DIAGNOSIS — Z79899 Other long term (current) drug therapy: Secondary | ICD-10-CM | POA: Insufficient documentation

## 2015-05-10 DIAGNOSIS — R0789 Other chest pain: Secondary | ICD-10-CM | POA: Insufficient documentation

## 2015-05-10 DIAGNOSIS — Z8619 Personal history of other infectious and parasitic diseases: Secondary | ICD-10-CM | POA: Insufficient documentation

## 2015-05-10 DIAGNOSIS — F10220 Alcohol dependence with intoxication, uncomplicated: Secondary | ICD-10-CM

## 2015-05-10 DIAGNOSIS — F10120 Alcohol abuse with intoxication, uncomplicated: Secondary | ICD-10-CM | POA: Insufficient documentation

## 2015-05-10 DIAGNOSIS — G8929 Other chronic pain: Secondary | ICD-10-CM | POA: Insufficient documentation

## 2015-05-10 DIAGNOSIS — I252 Old myocardial infarction: Secondary | ICD-10-CM | POA: Insufficient documentation

## 2015-05-10 DIAGNOSIS — Z9861 Coronary angioplasty status: Secondary | ICD-10-CM | POA: Insufficient documentation

## 2015-05-10 DIAGNOSIS — Z8611 Personal history of tuberculosis: Secondary | ICD-10-CM | POA: Insufficient documentation

## 2015-05-10 DIAGNOSIS — Z720 Tobacco use: Secondary | ICD-10-CM | POA: Insufficient documentation
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
## 2015-05-10 NOTE — ED Provider Notes (Signed)
CSN: [id number]     Arrival date & time 05/10/15  0139 History   First MD Initiated Contact with Patient 05/10/15 0243     Chief Complaint  Patient presents with  . Requesting psych eval      (Consider location/radiation/quality/duration/timing/severity/associated sxs/prior Treatment) HPI Comments: The patient presents to the emergency department requesting alcohol detox. He is well known to the department, with a long history of alcoholism. He denies SI/HI.   The history is provided by the patient. No language interpreter was used.    Past Medical History  Diagnosis Date  . Hepatitis C   . Tuberculosis   . Myocardial infarction   . Anxiety   . Chronic pain   . Alcohol abuse   . History of heroin abuse    Past Surgical History  Procedure Laterality Date  . Coronary stent placement     Family History  Problem Relation Age of Onset  . Heart attack      paternal uncle died MI 38   . Cancer      lung-maternal GM  . Cystic fibrosis      mom. died with pna  . Alcohol abuse      brother   History  Substance Use Topics  . Smoking status: Current Every Day Smoker -- 2.00 packs/day    Types: Cigarettes  . Smokeless tobacco: Never Used     Comment: trying to quit  . Alcohol Use: Yes     Comment: daily  alcohol   ale ,liquor    Review of Systems  Constitutional: Negative for fever and chills.  HENT: Negative.   Respiratory: Negative.   Cardiovascular: Negative.        He states that at times he has heart racing that causes chest tightness. Asymptomatic now.  Gastrointestinal: Negative.   Musculoskeletal: Negative.   Skin: Negative.   Neurological: Negative.       Allergies  Review of patient's allergies indicates no known allergies.  Home Medications   Prior to Admission medications   Medication Sig Start Date End Date Taking? Authorizing Provider  amLODipine (NORVASC) 5 MG tablet Take 1 tablet (5 mg total) by mouth daily. 05/08/15  Yes Rushil Sherrye Payor, MD   aspirin EC 81 MG EC tablet Take 1 tablet (81 mg total) by mouth daily. 05/08/15  Yes Rushil Sherrye Payor, MD  atorvastatin (LIPITOR) 40 MG tablet Take 1 tablet (40 mg total) by mouth daily at 6 PM. 05/08/15  Yes Rushil Sherrye Payor, MD  nitroGLYCERIN (NITROSTAT) 0.4 MG SL tablet Place 1 tablet (0.4 mg total) under the tongue every 5 (five) minutes as needed for chest pain. 05/08/15  Yes Rushil Sherrye Payor, MD  polymixin-bacitracin (POLYSPORIN) 500-10000 UNIT/GM OINT ointment Apply 1 application topically 2 (two) times daily. 05/08/15  Yes Rushil Sherrye Payor, MD   BP 100/66 mmHg  Pulse 78  Temp(Src) 97.9 F (36.6 C) (Oral)  Resp 20  SpO2 99% Physical Exam  Constitutional: He is oriented to person, place, and time. He appears well-developed and well-nourished.  Acutely intoxicated.  HENT:  Head: Normocephalic.  Neck: Normal range of motion. Neck supple.  Cardiovascular: Normal rate and regular rhythm.   Pulmonary/Chest: Effort normal and breath sounds normal.  Abdominal: Soft. Bowel sounds are normal. There is no tenderness. There is no rebound and no guarding.  Musculoskeletal: Normal range of motion.  Neurological: He is alert and oriented to person, place, and time. Coordination normal.  Ambulatory. Intoxicated but coherent.   Skin:  Skin is warm and dry. No rash noted.  Psychiatric: He has a normal mood and affect.    ED Course  Procedures (including critical care time) Labs Review Labs Reviewed - No data to display  Imaging Review No results found.   EKG Interpretation None      MDM   Final diagnoses:  Alcohol intoxication in active alcoholic, uncomplicated   Chart Reviewed. He reports intermittent chest pain constantly. He was seen at [facility] less than 24 hours ago with normal lab studies including troponin x 2 that were negative. Discussed that he would be given resources for alcohol detox and he could follow up outpatient. He has been ambulatory without unsteadiness and is felt  stable for discharge.     Charlann Lange, PA-C 05/10/15 4373  Linton Flemings, MD 05/10/15 [phone]
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
## 2015-05-10 NOTE — Discharge Instructions (Signed)
Finding Treatment for Alcohol and Drug Addiction °It can be hard to find the right place to get professional treatment. Here are some important things to consider: °· There are different types of treatment to choose from. °· Some programs are live-in (residential) while others are not (outpatient). Sometimes a combination is offered. °· No single type of program is right for everyone. °· Most treatment programs involve a combination of education, counseling, and a 12-step, spiritually-based approach. °· There are non-spiritually based programs (not 12-step). °· Some treatment programs are government sponsored. They are geared for patients without private insurance. °· Treatment programs can vary in many respects such as: °¨ Cost and types of insurance accepted. °¨ Types of on-site medical services offered. °¨ Length of stay, setting, and size. °¨ Overall philosophy of treatment.  °A person may need specialized treatment or have needs not addressed by all programs. For example, adolescents need treatment appropriate for their age. Other people have secondary disorders that must be managed as well. Secondary conditions can include mental illness, such as depression or diabetes. Often, a period of detoxification from alcohol or drugs is needed. This requires medical supervision and not all programs offer this. °THINGS TO CONSIDER WHEN SELECTING A TREATMENT PROGRAM  °· Is the program certified by the appropriate government agency? Even private programs must be certified and employ certified professionals. °· Does the program accept your insurance? If not, can a payment plan be set up? °· Is the facility clean, organized, and well run? Do they allow you to speak with graduates who can share their treatment experience with you? Can you tour the facility? Can you meet with staff? °· Does the program meet the full range of individual needs? °· Does the treatment program address sexual orientation and physical disabilities?  Do they provide age, gender, and culturally appropriate treatment services? °· Is treatment available in languages other than English? °· Is long-term aftercare support or guidance encouraged and provided? °· Is assessment of an individual's treatment plan ongoing to ensure it meets changing needs? °· Does the program use strategies to encourage reluctant patients to remain in treatment long enough to increase the likelihood of success? °· Does the program offer counseling (individual or group) and other behavioral therapies? °· Does the program offer medicine as part of the treatment regimen, if needed? °· Is there ongoing monitoring of possible relapse? Is there a defined relapse prevention program? Are services or referrals offered to family members to ensure they understand addiction and the recovery process? This would help them support the recovering individual. °· Are 12-step meetings held at the center or is transport available for patients to attend outside meetings? °In countries outside of the [place], see local directories for contact information for services in your area. °Document Released: 11/10/2005 Document Revised: 03/05/2012 Document Reviewed: 05/22/2008 °ExitCare® Patient Information ©2015 [employer]. This information is not intended to replace advice given to you by your health care provider. Make sure you discuss any questions you have with your health care provider. ° °Emergency Department Resource Guide °1) Find a Doctor and Pay Out of Pocket °Although you won't have to find out who is covered by your insurance plan, it is a good idea to ask around and get recommendations. You will then need to call the office and see if the doctor you have chosen will accept you as a new patient and what types of options they offer for patients who are self-pay. Some doctors offer discounts or will   set up payment plans for their patients who do not have insurance, but you will need to ask so you  aren't surprised when you get to your appointment. ° °2) Contact Your Local Health Department °Not all health departments have doctors that can see patients for sick visits, but many do, so it is worth a call to see if yours does. If you don't know where your local health department is, you can check in your phone book. The CDC also has a tool to help you locate your state's health department, and many state websites also have listings of all of their local health departments. ° °3) Find a Walk-in Clinic °If your illness is not likely to be very severe or complicated, you may want to try a walk in clinic. These are popping up all over the country in pharmacies, drugstores, and shopping centers. They're usually staffed by nurse practitioners or physician assistants that have been trained to treat common illnesses and complaints. They're usually fairly quick and inexpensive. However, if you have serious medical issues or chronic medical problems, these are probably not your best option. ° °No Primary Care Doctor: °- Call [facility] at  832-8000 - they can help you locate a primary care doctor that  accepts your insurance, provides certain services, etc. °- Physician Referral Service- 1-800-533-3463 ° °Chronic Pain Problems: °Organization         Address  Phone   Notes  °[facility]  (336) 297-2271 Patients need to be referred by their primary care doctor.  ° °Medication Assistance: °Organization         Address  Phone   Notes  °[place] [facility] [address] °[place], [place] [address] (336) 641-8030 --Must be a resident of [place] °-- Must have NO insurance coverage whatsoever (no Medicaid/ Medicare, etc.) °-- The pt. MUST have a primary care doctor that directs their care regularly and follows them in the community °  °[facility]  (866) 331-1348   °[employer]  (888) 892-1162   ° °Agencies that provide inexpensive medical care: °Organization          Address  Phone   Notes  °[facility]  (336) 832-8035   °[facility]    (336) 832-7272   °[facility] [address] °[place], [place] [address] (336) 832-4777   °[facility] [address], °[place] (336) 271-4999   °[facility]    (336) 373-0678   °[facility]    (336) 272-1050   °[facility] ° [address], [place] Phone:  (336) 832-4444, Fax:  (336) 832-4440 Hours of Operation:  9 am - 6 pm, M-F.  Also accepts Medicaid/Medicare and self-pay.  °[facility] ° [address], [place] Phone: (336) 832-3150, Fax: (336) [phone]. Hours of Operation:  8:30 am - 5:30 pm, M-F.  Also accepts Medicaid and self-pay.  °[facility] [place] [address], [place] Phone: (336) 878-6027   °[facility] [address], [place], [place] (336)723-1848, Ext. 123 Mondays & Thursdays: 7-9 AM.  First 15 patients are seen on a first come, first serve basis. °  ° °Medicaid-accepting [place] Providers: ° °Organization         Address  Phone   Notes  °[facility] [address], [place] (336) 641-2100   Also accepts self-pay patients.  °[facility] [address], [place] ° (336) 856-9996   °[facility] [address], [place] (336) 288-8857   °[facility] [address] [place] [address], [place] (336) 299-7000   °Veita Bland [address], [place]  ° (336) 373-1557 Only accepts [place] Access Medicaid patients after they have their name applied to their card.  ° °Self-Pay (no insurance) in [place]: ° °Organization         Address  Phone   Notes  °Sickle Cell Patients, [facility] [address], [place] (336) 832-1970   °[facility] [address], [place] (336) 832-4400    °[facility] ° [address], [place] (336) 992-4800   °[facility]/Dr. Osei-Bonsu ° [address] [place] [address], [place] or [address], [place] (336) [phone] Phone number for both [place] and [place] locations is the same.  °[facility] [address], [place] (336) 299-0000   °[facility] [address] [place] [address], [place] or [address] (336) 852-7530 °(336) 878-2260   °[facility] [address], [place] (336) 350-1642, phone; (336) 294-5005, fax Sees patients 1st and 3rd Saturday of every month.  Must not qualify for public or private insurance (i.e. Medicaid, Medicare, [place] Health Choice, Veterans' Benefits) • Household income should be no more than 200% of the poverty level •The clinic cannot treat you if you are pregnant or think you are pregnant • Sexually transmitted diseases are not treated at the clinic.  ° ° °Dental Care: °Organization         Address  Phone  Notes  °[facility] [address], [place] (336) 641-6152 Accepts children up to age 21 who are enrolled in Medicaid or [place] Health Choice; pregnant women with a Medicaid card; and children who have applied for Medicaid or [place] Health Choice, but were declined, whose parents can pay a reduced fee at time of service.  °[facility] [place]  [address], [place] (336) 641-7733 Accepts children up to age 21 who are enrolled in Medicaid or [place] Health Choice; pregnant women with a Medicaid card; and children who have applied for Medicaid or [place] Health Choice, but were declined, whose parents can pay a reduced fee at time of service.  °[facility] ° [address], [place] (336) 641-4533 Patients are seen by appointment only. Walk-ins are not accepted. [facility] will see patients 18  years of age and older. °Monday - Tuesday (8am-5pm) °Most Wednesdays (8:30-5pm) °$30 per visit, cash only  °[facility] ° [address], [place] (336) 641-4533 Patients are seen by appointment only. Walk-ins are not accepted. [facility] will see patients 18 years of age and older. °One Wednesday Evening (Monthly: Volunteer Based).  $30 per visit, cash only  °[facility]  (919) 537-3737 for adults; Children under age 4, call Graduate Pediatric Dentistry at ([phone]) [phone]. Children aged 4-14, please call ([phone]) [phone] to request a pediatric application. ° Dental services are provided in all areas of dental care including fillings, crowns and bridges, complete and partial dentures, implants, gum treatment, root canals, and extractions. Preventive care is also provided. Treatment   is provided to both adults and children. °Patients are selected via a lottery and there is often a waiting list. °  °[facility] [address], °[place] ° (336) 763-8833 [email or web address] °  °[facility] [address], [place], [place] (336)723-1848, Ext. 123 Second and Fourth Thursday of each month, opens at 6:30 AM; Clinic ends at 9 AM.  Patients are seen on a first-come first-served basis, and a limited number are seen during each clinic.  ° °[facility] ° [address], [place], [place] (336) 723-7904   Eligibility Requirements °You must have lived in [place], [place], or [place] counties for at least the last three months. °  You cannot be eligible for state or federal sponsored healthcare insurance, including Veterans Administration, Medicaid, or Medicare. °  You generally cannot be eligible for healthcare insurance through your employer.  °  How to apply: °Eligibility screenings are held every Tuesday and Wednesday afternoon from 1:00 pm until 4:00 pm. You do not need an appointment for the interview!  °[facility]  [address], [place], [place] 336-631-2330   °[facility]  336-342-8273   °[facility]  336-703-3100   ° [facility]  336-570-6415   ° °Behavioral Health Resources in the Community: °Intensive Outpatient Programs °Organization         Address  Phone  Notes  °[facility] [address], [place], [place] 336-878-6098   °[facility] [address], [place], [place] 336-832-9800   °[facility] [address], [place], [place] ° 336-882-2125   °[facility] [address],  °[place], [place] 1-800-853-5163 or 336-641-4981   °Substance Abuse Resources °Organization         Address  Phone  Notes  °[facility]  336-882-2125   °[facility]  336-784-9470   °[facility]  336-285-9073   °[facility]  336-845-3988   °Residential & Outpatient Substance Abuse Program  1-800-659-3381   °Psychological Services °Organization         Address  Phone  Notes  °[facility]  336- 832-9600   °[facility]  336- 378-7881   °[facility] [address], [place] 1-800-853-5163 or 336-641-4981   ° °Mobile Crisis Teams °Organization         Address  Phone  Notes  °[facility], Mobile Crisis Care Unit  1-877-626-1772   °Assertive °[facility] ° [address] [place], [place] 336-834-9664   °Sharon DeEsch [address] °[place] 336-554-5454   ° °Self-Help/Support Groups °Organization         Address  Phone             Notes  °[facility] - variety of support groups  336- 373-1402 Call for more information  °Narcotics Anonymous (NA), [facility] [address], °[place]  2 meetings at this location  ° °Residential Treatment Programs °Organization         Address  Phone  Notes  °[facility] [address],    ° [place]   1-866-801-8205   °[facility] ° [address], [place], [place] 704-293-8524   °[facility] [address], [place] 336-845-3988 Admissions: 8am-3pm M-F  °[facility] [address]    °[place],   [place] 336-841-1104   °[facility] [address], [place], [place] 336-379-7146   °[facility] [address]  °[place], [place] 336-285-9073   °[facility] Intensive Outpatient [address], [place], [place] 336-852-3033   °[facility] [address]  °[place], [place] 1-877-615-2722 or 336-784-9470   °[facility] [address] [place], [place] 336-227-7417 Accepts Medicaid  °[facility] [address]  °[place] 1-800-659-3381 Substance Abuse/Addiction Treatment  ° °[place] Behavioral Health Resources °Organization         Address  Phone  Notes  °[facility]  (888) 581-9988   °Julie Brannon, PhD [address] [place], [place]   (336) 349-5553 or (336) 951-0000   °[facility]   [address] °[place], [place] (336) 349-4454   °[facility] [address], [place], [place] (336) 342-8316 Insurance/Medicaid/sponsorship through [facility]  °[facility] [address]                                    [place], [place] (336) 342-8316 Therapy/tele-psych/case  °[facility] [address]  ° [place], [place] (336) 349-2233    °Dr. Arfeen  (336) 349-4544   °[facility] [place]  [employer] [place] [facility] 1) [address], [place] °2) [address], [place] °3)  [address], [place] (336) 349-3220 °(336) 342-7768 ° °(336) 342-8140   °[place] [facility] (336) 342-1394 or (336) 342-3537 (After Hours)    ° ° °

## 2015-05-10 NOTE — ED Notes (Signed)
Bed: [facility]PT3 Expected date:  Expected time:  Means of arrival:  Comments: EMS ETOH / requesting TTS

## 2015-05-10 NOTE — ED Notes (Signed)
Pt refusing to sign then refused to take d/c instructions.

## 2015-05-12 LAB — HCV RNA QUANT RFLX ULTRA OR GENOTYP
HCV QUANT: 357235 [IU]/mL — AB (ref <15)
HCV Quantitative Log: 5.55 {Log} — ABNORMAL HIGH (ref <1.18)

## 2015-05-13 ENCOUNTER — Emergency Department (HOSPITAL_COMMUNITY): Payer: BLUE CROSS/BLUE SHIELD

## 2015-05-13 ENCOUNTER — Encounter: Payer: Self-pay | Admitting: Internal Medicine

## 2015-05-13 ENCOUNTER — Inpatient Hospital Stay: Payer: BLUE CROSS/BLUE SHIELD | Admitting: Family Medicine

## 2015-05-13 ENCOUNTER — Emergency Department (HOSPITAL_COMMUNITY)
Admission: EM | Admit: 2015-05-13 | Discharge: 2015-05-13 | Disposition: A | Discharge status: Home / Self Care | Payer: BLUE CROSS/BLUE SHIELD | Attending: Emergency Medicine | Admitting: Emergency Medicine

## 2015-05-13 ENCOUNTER — Encounter (HOSPITAL_COMMUNITY): Payer: Self-pay | Admitting: *Deleted

## 2015-05-13 DIAGNOSIS — Z5900 Homelessness unspecified: Secondary | ICD-10-CM

## 2015-05-13 DIAGNOSIS — Y998 Other external cause status: Secondary | ICD-10-CM | POA: Insufficient documentation

## 2015-05-13 DIAGNOSIS — Y9289 Other specified places as the place of occurrence of the external cause: Secondary | ICD-10-CM | POA: Insufficient documentation

## 2015-05-13 DIAGNOSIS — F10929 Alcohol use, unspecified with intoxication, unspecified: Secondary | ICD-10-CM

## 2015-05-13 DIAGNOSIS — F10120 Alcohol abuse with intoxication, uncomplicated: Secondary | ICD-10-CM | POA: Insufficient documentation

## 2015-05-13 DIAGNOSIS — S8001XA Contusion of right knee, initial encounter: Secondary | ICD-10-CM

## 2015-05-13 DIAGNOSIS — Z8619 Personal history of other infectious and parasitic diseases: Secondary | ICD-10-CM | POA: Insufficient documentation

## 2015-05-13 DIAGNOSIS — Z720 Tobacco use: Secondary | ICD-10-CM | POA: Insufficient documentation

## 2015-05-13 DIAGNOSIS — F329 Major depressive disorder, single episode, unspecified: Secondary | ICD-10-CM | POA: Insufficient documentation

## 2015-05-13 DIAGNOSIS — S61217A Laceration without foreign body of left little finger without damage to nail, initial encounter: Secondary | ICD-10-CM

## 2015-05-13 DIAGNOSIS — Z7982 Long term (current) use of aspirin: Secondary | ICD-10-CM | POA: Insufficient documentation

## 2015-05-13 DIAGNOSIS — Z79899 Other long term (current) drug therapy: Secondary | ICD-10-CM | POA: Insufficient documentation

## 2015-05-13 DIAGNOSIS — G8929 Other chronic pain: Secondary | ICD-10-CM | POA: Insufficient documentation

## 2015-05-13 DIAGNOSIS — R4781 Slurred speech: Secondary | ICD-10-CM | POA: Insufficient documentation

## 2015-05-13 DIAGNOSIS — Y9389 Activity, other specified: Secondary | ICD-10-CM | POA: Insufficient documentation

## 2015-05-13 DIAGNOSIS — S50811A Abrasion of right forearm, initial encounter: Secondary | ICD-10-CM | POA: Insufficient documentation

## 2015-05-13 DIAGNOSIS — Z7902 Long term (current) use of antithrombotics/antiplatelets: Secondary | ICD-10-CM | POA: Insufficient documentation

## 2015-05-13 DIAGNOSIS — I252 Old myocardial infarction: Secondary | ICD-10-CM | POA: Insufficient documentation

## 2015-05-13 LAB — CBC WITH DIFFERENTIAL/PLATELET
Basophils Absolute: 0 10*3/uL (ref 0.0–0.1)
Basophils Relative: 0 % (ref 0–1)
Eosinophils Absolute: 0.1 10*3/uL (ref 0.0–0.7)
Eosinophils Relative: 1 % (ref 0–5)
HCT: 44 % (ref 39.0–52.0)
Hemoglobin: 15 g/dL (ref 13.0–17.0)
LYMPHS ABS: 1.6 10*3/uL (ref 0.7–4.0)
LYMPHS PCT: 23 % (ref 12–46)
MCH: 30.3 pg (ref 26.0–34.0)
MCHC: 34.1 g/dL (ref 30.0–36.0)
MCV: 88.9 fL (ref 78.0–100.0)
Monocytes Absolute: 0.5 10*3/uL (ref 0.1–1.0)
Monocytes Relative: 8 % (ref 3–12)
NEUTROS PCT: 68 % (ref 43–77)
Neutro Abs: 4.7 10*3/uL (ref 1.7–7.7)
PLATELETS: 174 10*3/uL (ref 150–400)
RBC: 4.95 MIL/uL (ref 4.22–5.81)
RDW: 15.2 % (ref 11.5–15.5)
WBC: 7 10*3/uL (ref 4.0–10.5)

## 2015-05-13 LAB — RAPID URINE DRUG SCREEN, HOSP PERFORMED
AMPHETAMINES: NOT DETECTED
Barbiturates: NOT DETECTED
Benzodiazepines: NOT DETECTED
Cocaine: NOT DETECTED
Opiates: NOT DETECTED
Tetrahydrocannabinol: NOT DETECTED

## 2015-05-13 LAB — COMPREHENSIVE METABOLIC PANEL
ALT: 38 U/L (ref 17–63)
AST: 46 U/L — AB (ref 15–41)
Albumin: 3.7 g/dL (ref 3.5–5.0)
Alkaline Phosphatase: 76 U/L (ref 38–126)
Anion gap: 12 (ref 5–15)
BUN: 6 mg/dL (ref 6–20)
CALCIUM: 8.4 mg/dL — AB (ref 8.9–10.3)
CO2: 21 mmol/L — AB (ref 22–32)
CREATININE: 1.12 mg/dL (ref 0.61–1.24)
Chloride: 104 mmol/L (ref 101–111)
GFR calc Af Amer: >60 mL/min (ref >60)
Glucose, Bld: 92 mg/dL (ref 65–99)
Potassium: 3.6 mmol/L (ref 3.5–5.1)
Sodium: 137 mmol/L (ref 135–145)
TOTAL PROTEIN: 7.1 g/dL (ref 6.5–8.1)
Total Bilirubin: 0.5 mg/dL (ref 0.3–1.2)

## 2015-05-13 LAB — ETHANOL: Alcohol, Ethyl (B): 288 mg/dL — ABNORMAL HIGH (ref <5)

## 2015-05-13 MED ORDER — CEPHALEXIN 500 MG PO CAPS: 500.0000 mg | ORAL_CAPSULE | Freq: Three times a day (TID) | ORAL | Status: DC

## 2015-05-13 NOTE — ED Notes (Signed)
Pt now reporting bilat foot pain; Tomi Bamberger MD notified; Tomi Bamberger assessed feet and gave instructions for patient not to wear wet socks/shoes; Tomi Bamberger MD OK'ed patient for discharge
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
## 2015-05-13 NOTE — Discharge Instructions (Signed)
You need to go to the several places in [place] where you can get help to stop drinking.     Emergency Department Resource Guide 1) Find a Doctor and Pay Out of Pocket Although you won't have to find out who is covered by your insurance plan, it is a good idea to ask around and get recommendations. You will then need to call the office and see if the doctor you have chosen will accept you as a new patient and what types of options they offer for patients who are self-pay. Some doctors offer discounts or will set up payment plans for their patients who do not have insurance, but you will need to ask so you aren't surprised when you get to your appointment.  2) Contact Your Local Health Department Not all health departments have doctors that can see patients for sick visits, but many do, so it is worth a call to see if yours does. If you don't know where your local health department is, you can check in your phone book. The CDC also has a tool to help you locate your state's health department, and many state websites also have listings of all of their local health departments.  3) Find a [facility] If your illness is not likely to be very severe or complicated, you may want to try a walk in clinic. These are popping up all over the country in pharmacies, drugstores, and shopping centers. They're usually staffed by nurse practitioners or physician assistants that have been trained to treat common illnesses and complaints. They're usually fairly quick and inexpensive. However, if you have serious medical issues or chronic medical problems, these are probably not your best option.  No Primary Care Doctor: - Call Health Connect at  [phone] - they can help you locate a primary care doctor that  accepts your insurance, provides certain services, etc. - Physician Referral Service- [phone]  Chronic Pain Problems: Organization         Address  Phone   Notes  [facility]   [phone] Patients need to be referred by their primary care doctor.   Medication Assistance: Organization         Address  Phone   Notes  [facility] Medication [facility] [address] [place], [place] [address] [phone] --Must be a resident of [facility] -- Must have NO insurance coverage whatsoever (no Medicaid/ Medicare, etc.) -- The pt. MUST have a primary care doctor that directs their care regularly and follows them in the community   MedAssist  [phone]   [employer]  [phone]    Agencies that provide inexpensive medical care: Organization         Address  Phone   Notes  [place]  [phone]   [facility]    [phone]   [facility] [place], [place] [address] [phone]   [place] [address] [place] [phone]   Planned Parenthood    [phone]   [facility]    [phone]   [place] [address], [place] Phone:  [phone], Fax:  [phone] Hours of Operation:  9 am - 6 pm, M-F.  Also accepts Medicaid/Medicare and self-pay.  [facility] for [address], [place] Phone: [phone], Fax: [phone])  [phone]. Hours of Operation:  8:30 am - 5:30 pm, M-F.  Also accepts Medicaid and self-pay.  [facility] [place] [address] [place] Phone: [phone]   [place], [place], [place] [phone], Ext. [phone] Mondays & Thursdays: 7-9 AM.  First 15 patients are seen on a first come, first serve basis.    [place] Providers:  Organization         Address  Phone   Notes  [facility] [address], [place] [phone] Also accepts self-pay patients.  [facility] [address], [place]  [phone]   [place], [address], [place] [phone]   [facility] [address] [place] [phone]   Lucianne Lei [address], [place]   [phone] Only accepts [place] Access [place] patients after they have their name applied to their card.   Self-Pay (no insurance) in [facility]:  Organization         Address  Phone   Notes  Sickle Cell Patients, [facility] [place] [phone]   [facility] [place] [phone]   [facility] [place]  [place], [place], [place] [phone]   [facility]/Dr. Osei-Bonsu  [address] [place] or [address], [place] [phone] Phone number for both [place] and [place] locations is the same.  Urgent Medical and [facility] [address] [place] [phone]   [facility] [address], [place] or [address] [phone]   [facility] [address], [place] [phone], phone; [phone], fax Sees patients 1st and 3rd Saturday of every month.  Must not qualify for public or private insurance (i.e. Medicaid, Medicare, [place] Health Choice, Veterans' Benefits)  Household income should be no more than 200% of the poverty level The clinic cannot treat you if you are pregnant or think you are pregnant  Sexually transmitted diseases are not treated at the clinic.    Dental Care: Organization         Address  Phone  Notes  [facility] Department of [facility] [place] [phone] Accepts children up to age 3 who are enrolled in [place] or [place]; pregnant women with a Medicaid card; and children who have applied for Medicaid or [place] Health  Choice, but were declined, whose parents can pay a reduced fee at time of service.  [facility] Department of [facility]  [address], [place] [phone] Accepts children up to age 52 who are enrolled in [place] or [place]; pregnant women with a Medicaid card; and children who have applied for Medicaid or [place] Health Choice, but were declined, whose parents can pay a reduced fee at time of service.  [facility] PRO[facility]  [place] [phone] Patients are seen by appointment only. Walk-ins are not accepted. [place] will see patients 15 years of age and older. Monday - Tuesday (8am-5pm) Most Wednesdays (8:30-5pm) $30 per visit, cash only  [facility] Adult [employer] PROGRAM  [address], [place] [phone]  Patients are seen by appointment only. Walk-ins are not accepted. [place] will see patients 53 years of age and older. One Wednesday Evening (Monthly: Volunteer Based).  $30 per visit, cash only  [place]  [phone] for adults; Children under age 26, call Graduate Pediatric Dentistry at [phone]. Children aged 61-14, please call [phone] to request a pediatric application.  Dental services are provided in all areas of dental care including fillings, crowns and bridges, complete and partial dentures, implants, gum treatment, root canals, and extractions. Preventive care is also provided. Treatment is provided to both adults and children. Patients are selected via a lottery and there is often a waiting list.   [facility] [address], [place]  [phone] [email or web address]   [facility] [address] [place], [place] [phone], Ext. [phone] Second and Fourth Thursday of each month, opens at 6:30 AM; Clinic ends at 9 AM.  Patients are seen on a first-come first-served basis, and a limited number are seen during each  clinic.   [facility]  [address] Hillard Danker [place], [place] [phone]   Eligibility Requirements You must have lived in [place], [place], or [place] counties for at least the last three months.   You cannot be eligible for state or federal sponsored [employer], including [employer], [place], or [employer].   You generally cannot be eligible for healthcare insurance through your employer.    How to apply: Eligibility screenings are held every Tuesday and Wednesday afternoon from 1:00 pm until 4:00 pm. You do not need an appointment for the interview!  [facility] [address] [place], [place]   [place]  [place] Department  [place]  [phone]    Behavioral Health Resources in the Community: Intensive Outpatient Programs Organization         Address  Phone  Notes  [place]. [address], [place], [place] [phone]   [facility] Outpatient [address], [place], [place]   ADS: Alcohol & Drug Svcs [address], [place], [place]   [place] [address]  [place], [place] or [phone]   Substance Abuse Resources Organization         Address  Phone  Notes  Alcohol and Drug Services  [phone]   [place]  [phone]   The [place]   Chinita Pester  [phone]   Residential & Outpatient Substance Abuse Program  [phone]   Psychological Services Organization         Address  Phone  Notes  [facility] [place]  [place]  [phone]   [place] [address] [place] or [phone]    Mobile Crisis Teams Organization         Address  Phone  Notes  [facility]rapeutic Alternatives, Mobile  Crisis Care Unit  [phone]   [facility]ive Psychotherapeutic Services  [address] [place], [place]   [place] [address] [place] [phone]    Self-Help/Support Groups Organization         Address  Phone             Notes  [facility]. [facility] - variety of support groups  [place]  Call for more information  Narcotics Anonymous (NA), [facility] [address], [employer] [place]  2 meetings at this location   Residential Facilities manager         Address  Phone  Notes  [facility] Residential Treatment [place],    [place]  [phone]   [facility]  [address] [place] [address], [place], [place]   [place], [place] [phone] Admissions: 8am-3pm M-F  Incentives Substance [place] [address],    [place], [place] [phone]   [facility] [address] [place], [place], [place]   [facility] [address],  [place], [place]   Insight Programs - Intensive Outpatient [address], [place], [place]   [facility] ([place].,  [place], [place] [phone] or [phone]   Residential Treatment Services (RTS) [address] [place], [place] Accepts Medicaid  [facility] [place] [address],  [place] [phone] Substance Abuse/Addiction Treatment   [facility] Organization         Address  Phone  Notes  [facility]  [phone]   Domenic Schwab, PhD [address] Arlis [place], [place]   [phone] or [phone]   [place], [place] [phone]   [facility] [address] [place], [place] [phone] Insurance/Medicaid/sponsorship through [facility] and Families [address]  [place]                                    [place], [place] [phone] [place] [address], [place] [phone]    Dr. Adele Schilder  [phone]   [facility] Dept. 1) [address] [place] 2) [place] 3)  [place] [address], [place] [phone]  [phone]   [place] [phone] or [phone] (After Hours)
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
## 2015-05-13 NOTE — Progress Notes (Signed)
Patient ID: Darrell [name], male   DOB: 06-Feb-1961, 54 y.o.   MRN: [id number]  HCV titer resulted today, and he warrants ID follow-up. We attempted to reach the patient though was unsuccessful. He does not have PCP though was agreeable to establishing with  [facility], please address there if he makes it to his follow-up appointment later today. Thank you.

## 2015-05-14 NOTE — Progress Notes (Signed)
Thank you Dr Posey Pronto for your efforts in trying to get in touch with the patient.

## 2015-05-16 ENCOUNTER — Emergency Department (HOSPITAL_BASED_OUTPATIENT_CLINIC_OR_DEPARTMENT_OTHER)
Admission: EM | Admit: 2015-05-16 | Discharge: 2015-05-16 | Discharge status: Against Medical Advice | Payer: BLUE CROSS/BLUE SHIELD | Attending: Emergency Medicine | Admitting: Emergency Medicine

## 2015-05-16 ENCOUNTER — Emergency Department (HOSPITAL_BASED_OUTPATIENT_CLINIC_OR_DEPARTMENT_OTHER): Payer: BLUE CROSS/BLUE SHIELD

## 2015-05-16 ENCOUNTER — Encounter (HOSPITAL_BASED_OUTPATIENT_CLINIC_OR_DEPARTMENT_OTHER): Payer: Self-pay

## 2015-05-16 DIAGNOSIS — F10920 Alcohol use, unspecified with intoxication, uncomplicated: Secondary | ICD-10-CM

## 2015-05-16 DIAGNOSIS — Z720 Tobacco use: Secondary | ICD-10-CM | POA: Insufficient documentation

## 2015-05-16 DIAGNOSIS — Z7902 Long term (current) use of antithrombotics/antiplatelets: Secondary | ICD-10-CM | POA: Insufficient documentation

## 2015-05-16 DIAGNOSIS — Z79899 Other long term (current) drug therapy: Secondary | ICD-10-CM | POA: Insufficient documentation

## 2015-05-16 DIAGNOSIS — S0081XA Abrasion of other part of head, initial encounter: Secondary | ICD-10-CM | POA: Insufficient documentation

## 2015-05-16 DIAGNOSIS — Y92524 Gas station as the place of occurrence of the external cause: Secondary | ICD-10-CM | POA: Insufficient documentation

## 2015-05-16 DIAGNOSIS — Z9861 Coronary angioplasty status: Secondary | ICD-10-CM | POA: Insufficient documentation

## 2015-05-16 DIAGNOSIS — Z8611 Personal history of tuberculosis: Secondary | ICD-10-CM | POA: Insufficient documentation

## 2015-05-16 DIAGNOSIS — Y998 Other external cause status: Secondary | ICD-10-CM | POA: Insufficient documentation

## 2015-05-16 DIAGNOSIS — G8929 Other chronic pain: Secondary | ICD-10-CM | POA: Insufficient documentation

## 2015-05-16 DIAGNOSIS — Z7982 Long term (current) use of aspirin: Secondary | ICD-10-CM | POA: Insufficient documentation

## 2015-05-16 DIAGNOSIS — Z8659 Personal history of other mental and behavioral disorders: Secondary | ICD-10-CM | POA: Insufficient documentation

## 2015-05-16 DIAGNOSIS — Z8619 Personal history of other infectious and parasitic diseases: Secondary | ICD-10-CM | POA: Insufficient documentation

## 2015-05-16 DIAGNOSIS — F10120 Alcohol abuse with intoxication, uncomplicated: Secondary | ICD-10-CM | POA: Insufficient documentation

## 2015-05-16 DIAGNOSIS — I252 Old myocardial infarction: Secondary | ICD-10-CM | POA: Insufficient documentation

## 2015-05-16 DIAGNOSIS — S0990XA Unspecified injury of head, initial encounter: Secondary | ICD-10-CM

## 2015-05-16 DIAGNOSIS — Y9389 Activity, other specified: Secondary | ICD-10-CM | POA: Insufficient documentation

## 2015-05-16 DIAGNOSIS — Z792 Long term (current) use of antibiotics: Secondary | ICD-10-CM | POA: Insufficient documentation

## 2015-05-16 NOTE — ED Notes (Signed)
Returned from xray, sleeping

## 2015-05-16 NOTE — ED Notes (Signed)
Patient transported to CT 
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
## 2015-05-17 ENCOUNTER — Emergency Department (HOSPITAL_BASED_OUTPATIENT_CLINIC_OR_DEPARTMENT_OTHER)
Admission: EM | Admit: 2015-05-17 | Discharge: 2015-05-17 | Disposition: A | Discharge status: Home / Self Care | Payer: BLUE CROSS/BLUE SHIELD | Attending: Emergency Medicine | Admitting: Emergency Medicine

## 2015-05-17 ENCOUNTER — Other Ambulatory Visit: Payer: Self-pay

## 2015-05-17 ENCOUNTER — Encounter (HOSPITAL_BASED_OUTPATIENT_CLINIC_OR_DEPARTMENT_OTHER): Payer: Self-pay | Admitting: *Deleted

## 2015-05-17 DIAGNOSIS — Z9861 Coronary angioplasty status: Secondary | ICD-10-CM | POA: Insufficient documentation

## 2015-05-17 DIAGNOSIS — Z7982 Long term (current) use of aspirin: Secondary | ICD-10-CM | POA: Insufficient documentation

## 2015-05-17 DIAGNOSIS — G8929 Other chronic pain: Secondary | ICD-10-CM | POA: Insufficient documentation

## 2015-05-17 DIAGNOSIS — Z79899 Other long term (current) drug therapy: Secondary | ICD-10-CM | POA: Insufficient documentation

## 2015-05-17 DIAGNOSIS — F10220 Alcohol dependence with intoxication, uncomplicated: Secondary | ICD-10-CM | POA: Insufficient documentation

## 2015-05-17 DIAGNOSIS — Z5900 Homelessness unspecified: Secondary | ICD-10-CM

## 2015-05-17 DIAGNOSIS — Z7902 Long term (current) use of antithrombotics/antiplatelets: Secondary | ICD-10-CM | POA: Insufficient documentation

## 2015-05-17 DIAGNOSIS — Z8619 Personal history of other infectious and parasitic diseases: Secondary | ICD-10-CM | POA: Insufficient documentation

## 2015-05-17 DIAGNOSIS — Z720 Tobacco use: Secondary | ICD-10-CM | POA: Insufficient documentation

## 2015-05-17 DIAGNOSIS — Z8611 Personal history of tuberculosis: Secondary | ICD-10-CM | POA: Insufficient documentation

## 2015-05-17 DIAGNOSIS — F10920 Alcohol use, unspecified with intoxication, uncomplicated: Secondary | ICD-10-CM

## 2015-05-17 DIAGNOSIS — Z792 Long term (current) use of antibiotics: Secondary | ICD-10-CM | POA: Insufficient documentation

## 2015-05-17 DIAGNOSIS — F1021 Alcohol dependence, in remission: Secondary | ICD-10-CM

## 2015-05-17 DIAGNOSIS — I252 Old myocardial infarction: Secondary | ICD-10-CM | POA: Insufficient documentation

## 2015-05-17 LAB — CBC WITH DIFFERENTIAL/PLATELET
BASOS ABS: 0 10*3/uL (ref 0.0–0.1)
Basophils Relative: 0 % (ref 0–1)
EOS PCT: 1 % (ref 0–5)
Eosinophils Absolute: 0.1 10*3/uL (ref 0.0–0.7)
HEMATOCRIT: 43.8 % (ref 39.0–52.0)
HEMOGLOBIN: 14.7 g/dL (ref 13.0–17.0)
LYMPHS ABS: 2.7 10*3/uL (ref 0.7–4.0)
Lymphocytes Relative: 37 % (ref 12–46)
MCH: 29.9 pg (ref 26.0–34.0)
MCHC: 33.6 g/dL (ref 30.0–36.0)
MCV: 89.2 fL (ref 78.0–100.0)
MONOS PCT: 6 % (ref 3–12)
Monocytes Absolute: 0.5 10*3/uL (ref 0.1–1.0)
NEUTROS PCT: 56 % (ref 43–77)
Neutro Abs: 3.9 10*3/uL (ref 1.7–7.7)
Platelets: 168 10*3/uL (ref 150–400)
RBC: 4.91 MIL/uL (ref 4.22–5.81)
RDW: 15.7 % — ABNORMAL HIGH (ref 11.5–15.5)
WBC: 7.2 10*3/uL (ref 4.0–10.5)

## 2015-05-17 LAB — COMPREHENSIVE METABOLIC PANEL
ALBUMIN: 3.7 g/dL (ref 3.5–5.0)
ALK PHOS: 74 U/L (ref 38–126)
ALT: 36 U/L (ref 17–63)
ANION GAP: 12 (ref 5–15)
AST: 44 U/L — ABNORMAL HIGH (ref 15–41)
BUN: 5 mg/dL — AB (ref 6–20)
CALCIUM: 8.1 mg/dL — AB (ref 8.9–10.3)
CHLORIDE: 104 mmol/L (ref 101–111)
CO2: 23 mmol/L (ref 22–32)
CREATININE: 0.89 mg/dL (ref 0.61–1.24)
GFR calc Af Amer: >60 mL/min (ref >60)
GFR calc non Af Amer: >60 mL/min (ref >60)
Glucose, Bld: 126 mg/dL — ABNORMAL HIGH (ref 65–99)
POTASSIUM: 3.2 mmol/L — AB (ref 3.5–5.1)
Sodium: 139 mmol/L (ref 135–145)
TOTAL PROTEIN: 7 g/dL (ref 6.5–8.1)
Total Bilirubin: 0.4 mg/dL (ref 0.3–1.2)

## 2015-05-17 LAB — ETHANOL: Alcohol, Ethyl (B): 309 mg/dL (ref <5)

## 2015-05-17 LAB — TROPONIN I

## 2015-05-17 NOTE — ED Notes (Signed)
No changes, wrapped in blanket, sleeping, NAD, calm, in view of nurses station, lab results reviewed.

## 2015-05-17 NOTE — ED Notes (Signed)
Pt sleeping/ snoring at this time.
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
## 2015-05-17 NOTE — ED Notes (Addendum)
No changes, wrapped in blanket, sleeping, NAD, calm, in view of nurses station, lab results reviewed.

## 2015-05-17 NOTE — ED Notes (Signed)
Pt awake, NAD, calm, interactive, speech clear, jovial, states, "where is my horse".

## 2015-05-17 NOTE — ED Notes (Signed)
Last ETOH around 2330, "wants detox", also c/o body aches, HA, BP high, and dizziness, mentions hot flashes and deliium (A&Ox4 at this time), reports recent SI/HI (denies now), denies AVH (none noted), also admits to recent marijuana, use. Last detox was in [facility], verbalizes "upset b/c they did not keep me". Polite, loud and cooperative at this time, eating snack.

## 2015-05-17 NOTE — ED Notes (Signed)
Not in room

## 2015-05-17 NOTE — Discharge Instructions (Signed)
Alcohol Intoxication Alcohol intoxication occurs when the amount of alcohol that a person has consumed impairs his or her ability to mentally and physically function. Alcohol directly impairs the normal chemical activity of the brain. Drinking large amounts of alcohol can lead to changes in mental function and behavior, and it can cause many physical effects that can be harmful.  Alcohol intoxication can range in severity from mild to very severe. Various factors can affect the level of intoxication that occurs, such as the person's age, gender, weight, frequency of alcohol consumption, and the presence of other medical conditions (such as diabetes, seizures, or heart conditions). Dangerous levels of alcohol intoxication may occur when people drink large amounts of alcohol in a short period (binge drinking). Alcohol can also be especially dangerous when combined with certain prescription medicines or "recreational" drugs. SIGNS AND SYMPTOMS Some common signs and symptoms of mild alcohol intoxication include:  Loss of coordination.  Changes in mood and behavior.  Impaired judgment.  Slurred speech. As alcohol intoxication progresses to more severe levels, other signs and symptoms will appear. These may include:  Vomiting.  Confusion and impaired memory.  Slowed breathing.  Seizures.  Loss of consciousness. DIAGNOSIS  Your health care provider will take a medical history and perform a physical exam. You will be asked about the amount and type of alcohol you have consumed. Blood tests will be done to measure the concentration of alcohol in your blood. In many places, your blood alcohol level must be lower than 80 mg/dL (0.08%) to legally drive. However, many dangerous effects of alcohol can occur at much lower levels.  TREATMENT  People with alcohol intoxication often do not require treatment. Most of the effects of alcohol intoxication are temporary, and they go away as the alcohol naturally  leaves the body. Your health care provider will monitor your condition until you are stable enough to go home. Fluids are sometimes given through an IV access tube to help prevent dehydration.  HOME CARE INSTRUCTIONS  Do not drive after drinking alcohol.  Stay hydrated. Drink enough water and fluids to keep your urine clear or pale yellow. Avoid caffeine.   Only take over-the-counter or prescription medicines as directed by your health care provider.  SEEK MEDICAL CARE IF:   You have persistent vomiting.   You do not feel better after a few days.  You have frequent alcohol intoxication. Your health care provider can help determine if you should see a substance use treatment counselor. SEEK IMMEDIATE MEDICAL CARE IF:   You become shaky or tremble when you try to stop drinking.   You shake uncontrollably (seizure).   You throw up (vomit) blood. This may be bright red or may look like black coffee grounds.   You have blood in your stool. This may be bright red or may appear as a black, tarry, bad smelling stool.   You become lightheaded or faint.  MAKE SURE YOU:   Understand these instructions.  Will watch your condition.  Will get help right away if you are not doing well or get worse. Document Released: 09/21/2005 Document Revised: 08/14/2013 Document Reviewed: 05/17/2013 [facility] Patient Information 2015 [place], [place]. This information is not intended to replace advice given to you by your health care provider. Make sure you discuss any questions you have with your health care provider.  Finding Treatment for Alcohol and Drug Addiction It can be hard to find the right place to get professional treatment. Here are some important things to  consider:  There are different types of treatment to choose from.  Some programs are live-in (residential) while others are not (outpatient). Sometimes a combination is offered.  No single type of program is right for  everyone.  Most treatment programs involve a combination of education, counseling, and a 12-step, spiritually-based approach.  There are non-spiritually based programs (not 12-step).  Some treatment programs are government sponsored. They are geared for patients without private insurance.  Treatment programs can vary in many respects such as:  Cost and types of insurance accepted.  Types of on-site medical services offered.  Length of stay, setting, and size.  Overall philosophy of treatment. A person may need specialized treatment or have needs not addressed by all programs. For example, adolescents need treatment appropriate for their age. Other people have secondary disorders that must be managed as well. Secondary conditions can include mental illness, such as depression or diabetes. Often, a period of detoxification from alcohol or drugs is needed. This requires medical supervision and not all programs offer this. THINGS TO CONSIDER WHEN SELECTING A TREATMENT PROGRAM   Is the program certified by the appropriate government agency? Even private programs must be certified and employ certified professionals.  Does the program accept your insurance? If not, can a payment plan be set up?  Is the facility clean, organized, and well run? Do they allow you to speak with graduates who can share their treatment experience with you? Can you tour the facility? Can you meet with staff?  Does the program meet the full range of individual needs?  Does the treatment program address sexual orientation and physical disabilities? Do they provide age, gender, and culturally appropriate treatment services?  Is treatment available in languages other than English?  Is long-term aftercare support or guidance encouraged and provided?  Is assessment of an individual's treatment plan ongoing to ensure it meets changing needs?  Does the program use strategies to encourage reluctant patients to remain  in treatment long enough to increase the likelihood of success?  Does the program offer counseling (individual or group) and other behavioral therapies?  Does the program offer medicine as part of the treatment regimen, if needed?  Is there ongoing monitoring of possible relapse? Is there a defined relapse prevention program? Are services or referrals offered to family members to ensure they understand addiction and the recovery process? This would help them support the recovering individual.  Are 12-step meetings held at the center or is transport available for patients to attend outside meetings? In countries outside of the [place] and [place], Surveyor, minerals for contact information for services in your area. Document Released: 11/10/2005 Document Revised: 03/05/2012 Document Reviewed: 05/22/2008 [facility] Patient Information 2015 [place], [place]. This information is not intended to replace advice given to you by your health care provider. Make sure you discuss any questions you have with your health care provider.    Emergency Department Resource Guide 1) Find a Doctor and Pay Out of Pocket Although you won't have to find out who is covered by your insurance plan, it is a good idea to ask around and get recommendations. You will then need to call the office and see if the doctor you have chosen will accept you as a new patient and what types of options they offer for patients who are self-pay. Some doctors offer discounts or will set up payment plans for their patients who do not have insurance, but you will need to ask so you aren't surprised when  you get to your appointment.  2) Contact Your Local Health Department Not all health departments have doctors that can see patients for sick visits, but many do, so it is worth a call to see if yours does. If you don't know where your local health department is, you can check in your phone book. The CDC also has a tool to help you locate your  state's health department, and many state websites also have listings of all of their local health departments.  3) Find a [facility] If your illness is not likely to be very severe or complicated, you may want to try a walk in clinic. These are popping up all over the country in pharmacies, drugstores, and shopping centers. They're usually staffed by nurse practitioners or physician assistants that have been trained to treat common illnesses and complaints. They're usually fairly quick and inexpensive. However, if you have serious medical issues or chronic medical problems, these are probably not your best option.  No Primary Care Doctor: - Call Health Connect at  [phone] - they can help you locate a primary care doctor that  accepts your insurance, provides certain services, etc. - Physician Referral Service- [phone]  Chronic Pain Problems: Organization         Address  Phone   Notes  [facility]  [phone] Patients need to be referred by their primary care doctor.   Medication Assistance: Organization         Address  Phone   Notes  [facility] Medication [facility] [place], [place] [address] [phone] --Must be a resident of [facility] -- Must have NO insurance coverage whatsoever (no Medicaid/ Medicare, etc.) -- The pt. MUST have a primary care doctor that directs their care regularly and follows them in the community   MedAssist  [phone]   [employer]  [phone]    Agencies that provide inexpensive medical care: Organization         Address  Phone   Notes  [place]  [phone]   [facility]    [phone]   [facility] [place], [place] [address] [phone]   [place] [address], [place] [phone]   Planned Parenthood    [phone]   [facility]    [phone]   [place] [address], [place] Phone:  [phone], Fax:  [phone] Hours of Operation:  9 am - 6 pm, M-F.  Also accepts Medicaid/Medicare and self-pay.  [facility] for [place], [address], [place] Phone: [phone], Fax: [phone]. Hours of Operation:  8:30 am - 5:30 pm, M-F.  Also accepts Medicaid and self-pay.  [facility] [address] [place] Phone: [phone]   George Mason, [place], [place] [phone], Ext. [phone] Mondays & Thursdays: 7-9 AM.  First 15 patients are seen on a first come, first serve basis.    [place] Providers:  Organization         Address  Phone   Notes  [facility] [address], [place] [phone] Also accepts self-pay patients.  [place], [place]  [phone]   [place]  [place], [address], [place] [phone]   [place] [address], [place] [phone]   Lucianne Lei [address], [place]   [phone] Only accepts [place] Access [place] patients after they have their name applied to their card.   Self-Pay (no insurance) in [facility]:  Organization         Address  Phone   Notes  Sickle Cell Patients, [facility] [place] [phone]   [facility] Urgent Care [place] [phone]   [facility] [place]  [name], [place], [place] [phone]   [facility]/Dr. Osei-Bonsu  [address] [place] or [address], [place] [phone] Phone number for both [place] and [place] locations is the same.  [facility]gent [facility] and [facility] [address] [place] [phone]   [facility] [address] [place] or [address] [phone]   [facility] [address], [place] [phone], phone; [phone], fax Sees patients 1st and 3rd Saturday of every month.  Must not qualify for public or private insurance (i.e. Medicaid, Medicare, [place] Health Choice, Veterans' Benefits)  Household income should be no more than 200% of the poverty level The clinic cannot treat you if you are pregnant or think you are pregnant  Sexually transmitted diseases are not treated at the clinic.    Dental Care: Organization         Address  Phone  Notes  [facility] Department of [facility] [place] [phone] Accepts children up to age 21 who are enrolled in [place] or [place]; pregnant women with a Medicaid card; and children who have applied for Medicaid or [place] Health Choice, but were declined, whose parents can pay a reduced fee at time of service.  [facility] Department of [facility]  [address], [place] [phone] Accepts children up to age 71 who are enrolled in [place] or [place]; pregnant women with a Medicaid card; and children who have applied for Medicaid or  Health Choice, but were declined, whose parents can pay a reduced fee at time of service.  [place] Adult Dental Access PROGRAM  [place] [phone] Patients are seen by appointment only. Walk-ins are not accepted. [place] will see patients 52 years of age and older. Monday - Tuesday (8am-5pm) Most Wednesdays (8:30-5pm) $30 per visit, cash only  [facility] Adult Dental Access PROGRAM  [address], [facility] [phone] Patients are seen by appointment only. Walk-ins are not accepted. [place] will see patients 42 years of age and older. One Wednesday Evening (Monthly: Volunteer  Based).  $30 per visit, cash only  [place]  [phone] for adults; Children under age 26, call Graduate Pediatric Dentistry at [phone]. Children aged 25-14, please call [phone] to request a pediatric application.  Dental services are provided in all areas of dental care including fillings, crowns and bridges, complete and partial dentures, implants, gum treatment, root canals, and extractions. Preventive care is also provided. Treatment is provided to both adults and children. Patients are selected via a lottery and there is often a waiting list.   [phone]  Dental Clinic [address], [place]  [phone] [email or web address]   [facility] [address] [place], [place] [phone], Ext. [phone] Second and Fourth Thursday of each month, opens at 6:30 AM; Clinic ends at 9 AM.  Patients are seen on a first-come first-served basis, and a limited number are seen during each clinic.   [facility]  [address] Hillard Danker [place], [place] [phone]   Eligibility Requirements You must have lived in [place], [place], or [place] counties for at least the last three months.   You cannot be eligible for state or federal sponsored [employer], including [employer], [place], or [employer].   You generally cannot be eligible for healthcare insurance through your employer.    How to apply: Eligibility screenings are held every Tuesday and Wednesday afternoon from 1:00 pm until 4:00 pm. You do not need an appointment for the interview!  [facility] [address] [place], [place]   [place]anch  [place] Department  [place]  [phone]    Behavioral Health Resources in the Community: Intensive Outpatient Programs Organization         Address  Phone  Notes  [place]. [address], [place], [place] [phone]   [facility] Outpatient [address], [place], [place]   ADS: Alcohol & Drug Svcs [address], [place], [place]   [place] [address],  [place], [place] or [phone]   Substance Abuse Resources Organization         Address  Phone  Notes  Alcohol and Drug Services  [phone]   [place]  [phone]   [address]   Chinita Pester  [phone]   Residential & Outpatient Substance Abuse Program  [phone]   Psychological Services Organization         Address  Phone  Notes  [facility] [place]  [place]  [phone]   [place] [address], [place] or [phone]    Mobile Crisis Teams Organization         Address  Phone  Notes  [facility], Mobile Crisis Care Unit  [phone]   [facility]ive Psychotherapeutic Services  [address] [place], [place]   [place] Levels [address], [place] [phone]    Self-Help/Support Groups Organization         Address  Phone             Notes  [place]. of [place] - variety of support groups  [place] Call for more information  Narcotics Anonymous (NA), [facility] [address], [employer] [place]  2 meetings at this location   Special educational needs teacher         Address  Phone  Notes  ASAP Residential Treatment [place],    [place]  [phone]   [facility]  [address] [place] [address], [place], [place]   [employer] [place], [place] [phone] Admissions: 8am-3pm M-F  Incentives Substance [place] [address],    [place], [place] [phone]   [facility] [address] [place], [place], [place]    The [place].,  [place], [place]   [facility] - Intensive Outpatient [address] Kristeen [address], [place], [place]   [facility] [address]  [place], [place] [phone] or [phone]   [facility]idential Treatment Services (RTS) [address], [place], [place] Accepts Medicaid  [facility]ship [place] [address],  [place] [phone] Substance Abuse/Addiction Treatment   [facility] Organization         Address  Phone  Notes  [facility]  [phone]   Domenic Schwab, PhD [address] Arlis Porta [place], [place]   [phone] or [phone]   [place], [place] [phone]   [facility] [address] [place], [place] [phone] Insurance/Medicaid/sponsorship through [facility] and Families [address], [address] [place]                                    [place], [place] [phone] [place] [address], [place] [phone]    Dr. Adele Schilder  [phone]   [facility] [place] Dept. 1) [address] [place] 2) [place] 3)  [place] [address], [place] [phone]  [phone]   [place] [phone] or [phone] (After Hours)

## 2015-05-17 NOTE — ED Notes (Signed)
Pt arrived via [facility]  with c/o wanting detox from ETOH. Pt states he is having suicide thoughts. States if he doesn't get help he is going to "walk out in front of a car". Pt states he is homeless. Pt states that he has had "a lot" to drink tonight ETOH. [facility] to [facility], but states he was not seen because they "kicked me out"

## 2015-05-17 NOTE — ED Notes (Signed)
MD aware of pt's ETOH level. No further orders at present.

## 2015-05-17 NOTE — ED Notes (Signed)
Dr. Delo into see pt 

## 2015-05-18 ENCOUNTER — Emergency Department (HOSPITAL_COMMUNITY)
Admission: EM | Admit: 2015-05-18 | Discharge: 2015-05-18 | Disposition: A | Discharge status: Home / Self Care | Payer: BLUE CROSS/BLUE SHIELD | Attending: Emergency Medicine | Admitting: Emergency Medicine

## 2015-05-18 ENCOUNTER — Encounter (HOSPITAL_COMMUNITY): Payer: Self-pay

## 2015-05-18 ENCOUNTER — Other Ambulatory Visit: Payer: Self-pay

## 2015-05-18 DIAGNOSIS — R200 Anesthesia of skin: Secondary | ICD-10-CM | POA: Insufficient documentation

## 2015-05-18 DIAGNOSIS — R000 Tachycardia, unspecified: Secondary | ICD-10-CM | POA: Insufficient documentation

## 2015-05-18 DIAGNOSIS — Z8659 Personal history of other mental and behavioral disorders: Secondary | ICD-10-CM | POA: Insufficient documentation

## 2015-05-18 DIAGNOSIS — M79602 Pain in left arm: Secondary | ICD-10-CM | POA: Insufficient documentation

## 2015-05-18 DIAGNOSIS — R0602 Shortness of breath: Secondary | ICD-10-CM | POA: Insufficient documentation

## 2015-05-18 DIAGNOSIS — R110 Nausea: Secondary | ICD-10-CM | POA: Insufficient documentation

## 2015-05-18 DIAGNOSIS — R079 Chest pain, unspecified: Secondary | ICD-10-CM

## 2015-05-18 DIAGNOSIS — Z720 Tobacco use: Secondary | ICD-10-CM | POA: Insufficient documentation

## 2015-05-18 DIAGNOSIS — Z8619 Personal history of other infectious and parasitic diseases: Secondary | ICD-10-CM | POA: Insufficient documentation

## 2015-05-18 DIAGNOSIS — Z9861 Coronary angioplasty status: Secondary | ICD-10-CM | POA: Insufficient documentation

## 2015-05-18 DIAGNOSIS — Z5900 Homelessness unspecified: Secondary | ICD-10-CM

## 2015-05-18 DIAGNOSIS — F1010 Alcohol abuse, uncomplicated: Secondary | ICD-10-CM

## 2015-05-18 DIAGNOSIS — R61 Generalized hyperhidrosis: Secondary | ICD-10-CM | POA: Insufficient documentation

## 2015-05-18 DIAGNOSIS — G8929 Other chronic pain: Secondary | ICD-10-CM | POA: Insufficient documentation

## 2015-05-18 DIAGNOSIS — I252 Old myocardial infarction: Secondary | ICD-10-CM | POA: Insufficient documentation

## 2015-05-18 DIAGNOSIS — Z792 Long term (current) use of antibiotics: Secondary | ICD-10-CM | POA: Insufficient documentation

## 2015-05-18 DIAGNOSIS — Z8611 Personal history of tuberculosis: Secondary | ICD-10-CM | POA: Insufficient documentation

## 2015-05-18 DIAGNOSIS — Z7982 Long term (current) use of aspirin: Secondary | ICD-10-CM | POA: Insufficient documentation

## 2015-05-18 DIAGNOSIS — Z79899 Other long term (current) drug therapy: Secondary | ICD-10-CM | POA: Insufficient documentation

## 2015-05-18 LAB — I-STAT TROPONIN, ED: TROPONIN I, POC: 0 ng/mL (ref 0.00–0.08)

## 2015-05-18 LAB — TROPONIN I: Troponin I: <0.03 ng/mL (ref <0.031)

## 2015-05-18 NOTE — Discharge Instructions (Signed)
You can go to several places to get alcohol treatment. BUT YOU NEED TO GO THERE!!!!    Emergency Department Resource Guide   Behavioral Health Resources in the Community: Intensive Outpatient Programs Organization         Address  Phone  Notes  [place]. [address] [place], [place] [phone]   [facility] Outpatient [address] [place], [place]   ADS: Alcohol & Drug Svcs [address] [place], [place]   [place] [address]  [place], [place] or [phone]   Substance Abuse Resources Organization         Address  Phone  Notes  Alcohol and Drug Services  [phone]   [place]  [phone]   The [place]   Chinita Pester  [phone]   Residential & Outpatient Substance Abuse Program  [phone]   Psychological Services Organization         Address  Phone  Notes  [facility]  [place]  [phone]   [place] [address] [place] or [phone]    Mobile Crisis Teams Organization         Address  Phone  Notes  [facility], Mobile Crisis Care Unit  [phone]   [facility]ive Psychotherapeutic Services  [address] [place], [place]   [place] Levels [address], [place] [phone]    Self-Help/Support Groups Organization         Address  Phone             Notes  [place]. of [place] - variety of support groups  [place] Call for more information  Narcotics Anonymous (NA), [facility] [address], [employer]   2 meetings at this location   Special educational needs teacher         Address  Phone  Notes  ASAP Residential Treatment [place],    [place]  [phone]   [facility]  [address] [place] [address], [place], [place]   Wayne [place], [place] [phone] Admissions: 8am-3pm M-F  Incentives Substance [place] [address]    [place], [place] [phone]   [facility] [address] Jadene Pierini [place], [place]   [facility] [address]  [place], [place]   [facility] - Intensive Outpatient [address] Kristeen [address], [place], [place]   [facility] [place].,  [place], [place] or [phone]   [facility]idential Treatment Services (RTS) [address], [place], [place] Accepts Medicaid  [facility] [place] [address]  [place] [phone] Substance Abuse/Addiction Treatment

## 2015-05-18 NOTE — ED Notes (Signed)
Attempting to discharge pt.  Pt states he needs to stay and sleep.  Security called to bedside due to pt becoming verbally abusive.

## 2015-05-18 NOTE — ED Notes (Signed)
Per EMS: Pt complaining of left chest pain and heaviness. EMS gave 324 ASA and 1 nitro. Pt reports no pain at this time. Admits to ETOH intoxication tonight. Pt states "I'm dying!" HR 114bpm, occasional PVC.  Pt A/Ox4, NAD.

## 2015-05-18 NOTE — ED Notes (Signed)
Pt placed on cardiac monitor and continuous pulse ox.
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
## 2015-05-18 NOTE — ED Notes (Signed)
Discharge orders placed on pt.  Awaiting I-stat Troponin results.  EDP notified of same.

## 2015-05-18 NOTE — ED Notes (Signed)
Pt began to use profanity, when awakened for blood draws. Pt wanted to be left alone. Tech also asked for urine, during that time.

## 2015-05-24 ENCOUNTER — Emergency Department (HOSPITAL_COMMUNITY)
Admission: EM | Admit: 2015-05-24 | Discharge: 2015-05-24 | Disposition: A | Discharge status: Home / Self Care | Payer: BLUE CROSS/BLUE SHIELD | Attending: Emergency Medicine | Admitting: Emergency Medicine

## 2015-05-24 ENCOUNTER — Emergency Department (HOSPITAL_COMMUNITY): Payer: BLUE CROSS/BLUE SHIELD

## 2015-05-24 ENCOUNTER — Encounter (HOSPITAL_COMMUNITY): Payer: Self-pay | Admitting: Emergency Medicine

## 2015-05-24 DIAGNOSIS — F10920 Alcohol use, unspecified with intoxication, uncomplicated: Secondary | ICD-10-CM

## 2015-05-24 DIAGNOSIS — Z8611 Personal history of tuberculosis: Secondary | ICD-10-CM | POA: Insufficient documentation

## 2015-05-24 DIAGNOSIS — G8929 Other chronic pain: Secondary | ICD-10-CM | POA: Insufficient documentation

## 2015-05-24 DIAGNOSIS — F10120 Alcohol abuse with intoxication, uncomplicated: Secondary | ICD-10-CM | POA: Insufficient documentation

## 2015-05-24 DIAGNOSIS — Z9861 Coronary angioplasty status: Secondary | ICD-10-CM | POA: Insufficient documentation

## 2015-05-24 DIAGNOSIS — I252 Old myocardial infarction: Secondary | ICD-10-CM | POA: Insufficient documentation

## 2015-05-24 DIAGNOSIS — Z8619 Personal history of other infectious and parasitic diseases: Secondary | ICD-10-CM | POA: Insufficient documentation

## 2015-05-24 DIAGNOSIS — Z720 Tobacco use: Secondary | ICD-10-CM | POA: Insufficient documentation

## 2015-05-24 DIAGNOSIS — R0789 Other chest pain: Secondary | ICD-10-CM | POA: Insufficient documentation

## 2015-05-24 LAB — BASIC METABOLIC PANEL
Anion gap: 11 (ref 5–15)
BUN: <5 mg/dL — ABNORMAL LOW (ref 6–20)
CO2: 23 mmol/L (ref 22–32)
CREATININE: 0.88 mg/dL (ref 0.61–1.24)
Calcium: 8.1 mg/dL — ABNORMAL LOW (ref 8.9–10.3)
Chloride: 103 mmol/L (ref 101–111)
GFR calc non Af Amer: >60 mL/min (ref >60)
Glucose, Bld: 106 mg/dL — ABNORMAL HIGH (ref 65–99)
Potassium: 3.5 mmol/L (ref 3.5–5.1)
SODIUM: 137 mmol/L (ref 135–145)

## 2015-05-24 LAB — CBC
HCT: 42.7 % (ref 39.0–52.0)
HEMOGLOBIN: 14.7 g/dL (ref 13.0–17.0)
MCH: 30.6 pg (ref 26.0–34.0)
MCHC: 34.4 g/dL (ref 30.0–36.0)
MCV: 88.8 fL (ref 78.0–100.0)
Platelets: 145 10*3/uL — ABNORMAL LOW (ref 150–400)
RBC: 4.81 MIL/uL (ref 4.22–5.81)
RDW: 15.5 % (ref 11.5–15.5)
WBC: 6.1 10*3/uL (ref 4.0–10.5)

## 2015-05-24 LAB — I-STAT TROPONIN, ED
Troponin i, poc: 0.01 ng/mL (ref 0.00–0.08)
Troponin i, poc: 0.01 ng/mL (ref 0.00–0.08)

## 2015-05-24 NOTE — Discharge Instructions (Signed)
Alcohol Intoxication Alcohol intoxication occurs when the amount of alcohol that a person has consumed impairs his or her ability to mentally and physically function. Alcohol directly impairs the normal chemical activity of the brain. Drinking large amounts of alcohol can lead to changes in mental function and behavior, and it can cause many physical effects that can be harmful.  Alcohol intoxication can range in severity from mild to very severe. Various factors can affect the level of intoxication that occurs, such as the person's age, gender, weight, frequency of alcohol consumption, and the presence of other medical conditions (such as diabetes, seizures, or heart conditions). Dangerous levels of alcohol intoxication may occur when people drink large amounts of alcohol in a short period (binge drinking). Alcohol can also be especially dangerous when combined with certain prescription medicines or "recreational" drugs. SIGNS AND SYMPTOMS Some common signs and symptoms of mild alcohol intoxication include:  Loss of coordination.  Changes in mood and behavior.  Impaired judgment.  Slurred speech. As alcohol intoxication progresses to more severe levels, other signs and symptoms will appear. These may include:  Vomiting.  Confusion and impaired memory.  Slowed breathing.  Seizures.  Loss of consciousness. DIAGNOSIS  Your health care provider will take a medical history and perform a physical exam. You will be asked about the amount and type of alcohol you have consumed. Blood tests will be done to measure the concentration of alcohol in your blood. In many places, your blood alcohol level must be lower than 80 mg/dL (0.08%) to legally drive. However, many dangerous effects of alcohol can occur at much lower levels.  TREATMENT  People with alcohol intoxication often do not require treatment. Most of the effects of alcohol intoxication are temporary, and they go away as the alcohol naturally  leaves the body. Your health care provider will monitor your condition until you are stable enough to go home. Fluids are sometimes given through an IV access tube to help prevent dehydration.  HOME CARE INSTRUCTIONS  Do not drive after drinking alcohol.  Stay hydrated. Drink enough water and fluids to keep your urine clear or pale yellow. Avoid caffeine.   Only take over-the-counter or prescription medicines as directed by your health care provider.  SEEK MEDICAL CARE IF:   You have persistent vomiting.   You do not feel better after a few days.  You have frequent alcohol intoxication. Your health care provider can help determine if you should see a substance use treatment counselor. SEEK IMMEDIATE MEDICAL CARE IF:   You become shaky or tremble when you try to stop drinking.   You shake uncontrollably (seizure).   You throw up (vomit) blood. This may be bright red or may look like black coffee grounds.   You have blood in your stool. This may be bright red or may appear as a black, tarry, bad smelling stool.   You become lightheaded or faint.  MAKE SURE YOU:   Understand these instructions.  Will watch your condition.  Will get help right away if you are not doing well or get worse. Document Released: 09/21/2005 Document Revised: 08/14/2013 Document Reviewed: 05/17/2013 [facility] Patient Information 2015 [place], [place]. This information is not intended to replace advice given to you by your health care provider. Make sure you discuss any questions you have with your health care provider.  Chest Pain (Nonspecific) It is often hard to give a specific diagnosis for the cause of chest pain. There is always a chance that your pain  could be related to something serious, such as a heart attack or a blood clot in the lungs. You need to follow up with your health care provider for further evaluation. CAUSES   Heartburn.  Pneumonia or bronchitis.  Anxiety or  stress.  Inflammation around your heart (pericarditis) or lung (pleuritis or pleurisy).  A blood clot in the lung.  A collapsed lung (pneumothorax). It can develop suddenly on its own (spontaneous pneumothorax) or from trauma to the chest.  Shingles infection (herpes zoster virus). The chest wall is composed of bones, muscles, and cartilage. Any of these can be the source of the pain.  The bones can be bruised by injury.  The muscles or cartilage can be strained by coughing or overwork.  The cartilage can be affected by inflammation and become sore (costochondritis). DIAGNOSIS  Lab tests or other studies may be needed to find the cause of your pain. Your health care provider may have you take a test called an ambulatory electrocardiogram (ECG). An ECG records your heartbeat patterns over a 24-hour period. You may also have other tests, such as:  Transthoracic echocardiogram (TTE). During echocardiography, sound waves are used to evaluate how blood flows through your heart.  Transesophageal echocardiogram (TEE).  Cardiac monitoring. This allows your health care provider to monitor your heart rate and rhythm in real time.  Holter monitor. This is a portable device that records your heartbeat and can help diagnose heart arrhythmias. It allows your health care provider to track your heart activity for several days, if needed.  Stress tests by exercise or by giving medicine that makes the heart beat faster. TREATMENT   Treatment depends on what may be causing your chest pain. Treatment may include:  Acid blockers for heartburn.  Anti-inflammatory medicine.  Pain medicine for inflammatory conditions.  Antibiotics if an infection is present.  You may be advised to change lifestyle habits. This includes stopping smoking and avoiding alcohol, caffeine, and chocolate.  You may be advised to keep your head raised (elevated) when sleeping. This reduces the chance of acid going backward  from your stomach into your esophagus. Most of the time, nonspecific chest pain will improve within 2-3 days with rest and mild pain medicine.  HOME CARE INSTRUCTIONS   If antibiotics were prescribed, take them as directed. Finish them even if you start to feel better.  For the next few days, avoid physical activities that bring on chest pain. Continue physical activities as directed.  Do not use any tobacco products, including cigarettes, chewing tobacco, or electronic cigarettes.  Avoid drinking alcohol.  Only take medicine as directed by your health care provider.  Follow your health care provider's suggestions for further testing if your chest pain does not go away.  Keep any follow-up appointments you made. If you do not go to an appointment, you could develop lasting (chronic) problems with pain. If there is any problem keeping an appointment, call to reschedule. SEEK MEDICAL CARE IF:   Your chest pain does not go away, even after treatment.  You have a rash with blisters on your chest.  You have a fever. SEEK IMMEDIATE MEDICAL CARE IF:   You have increased chest pain or pain that spreads to your arm, neck, jaw, back, or abdomen.  You have shortness of breath.  You have an increasing cough, or you cough up blood.  You have severe back or abdominal pain.  You feel nauseous or vomit.  You have severe weakness.  You faint.  You have chills. This is an emergency. Do not wait to see if the pain will go away. Get medical help at once. Call your local emergency services (911 in [place] Do not drive yourself to the hospital. MAKE SURE YOU:   Understand these instructions.  Will watch your condition.  Will get help right away if you are not doing well or get worse. Document Released: 09/21/2005 Document Revised: 12/17/2013 Document Reviewed: 07/17/2008 [facility] Patient Information 2015 [place], [place]. This information is not intended to replace advice given to you by  your health care provider. Make sure you discuss any questions you have with your health care provider.   Emergency Department Resource Guide 1) Find a Doctor and Pay Out of Pocket Although you won't have to find out who is covered by your insurance plan, it is a good idea to ask around and get recommendations. You will then need to call the office and see if the doctor you have chosen will accept you as a new patient and what types of options they offer for patients who are self-pay. Some doctors offer discounts or will set up payment plans for their patients who do not have insurance, but you will need to ask so you aren't surprised when you get to your appointment.  2) Contact Your Local Health Department Not all health departments have doctors that can see patients for sick visits, but many do, so it is worth a call to see if yours does. If you don't know where your local health department is, you can check in your phone book. The CDC also has a tool to help you locate your state's health department, and many state websites also have listings of all of their local health departments.  3) Find a [facility] If your illness is not likely to be very severe or complicated, you may want to try a walk in clinic. These are popping up all over the country in pharmacies, drugstores, and shopping centers. They're usually staffed by nurse practitioners or physician assistants that have been trained to treat common illnesses and complaints. They're usually fairly quick and inexpensive. However, if you have serious medical issues or chronic medical problems, these are probably not your best option.  No Primary Care Doctor: - Call Health Connect at  [phone] - they can help you locate a primary care doctor that  accepts your insurance, provides certain services, etc. - Physician Referral Service- [phone]  Chronic Pain Problems: Organization         Address  Phone   Notes  [facility]  [phone] Patients need to be referred by their primary care doctor.   Medication Assistance: Organization         Address  Phone   Notes  [facility] Medication [address] [place], [place] [address] [phone] --Must be a resident of [facility] -- Must have NO insurance coverage whatsoever (no Medicaid/ Medicare, etc.) -- The pt. MUST have a primary care doctor that directs their care regularly and follows them in the community   MedAssist  [phone]   [employer]  [phone]    Agencies that provide inexpensive medical care: Organization         Address  Phone   Notes  [place]  [phone]   [facility]    [phone]   [facility] [place], [place]  [phone]   [place] [address], [place] [phone]   Planned Parenthood    [phone]   [facility]    [phone]   [place] and [address], [place] Phone:  [phone], Fax:  [phone] Hours of Operation:  9 am - 6 pm, M-F.  Also accepts Medicaid/Medicare and self-pay.  [facility] for [address] [place], [address], [place] Phone: [phone], Fax: [phone]. Hours of Operation:  8:30 am - 5:30 pm, M-F.  Also accepts Medicaid and self-pay.  [facility] [address], [place] Phone: [phone]   [place], [place], [place] [phone], Ext. [phone] Mondays & Thursdays: 7-9 AM.  First 15 patients are seen on a first come, first serve basis.    [place] Providers:  Organization         Address  Phone   Notes  [facility] [address], [place] [phone] Also accepts self-pay patients.  [facility] [address], [place]  [phone]   [place], [address], [place] [phone]   [facility] [address], [place] [phone]   Lucianne Lei [address], [place]   [phone] Only accepts [place] Access [place] patients after they have their name applied to their card.   Self-Pay (no insurance) in [facility]:  Organization         Address  Phone   Notes  Sickle Cell Patients, [facility] [place] [phone]   [facility] [place] [phone]   [facility] [place]  [place], [place], [place] [phone]   [facility]/Dr. Osei-Bonsu  [address] [place] or [address], [place] [phone] Phone number for both [place] and [place] locations is the same.  [facility] [address] [place] [phone]   [facility] [address] [place] or [address] [phone]   [facility] [address] [place] [phone], phone; [phone], fax Sees patients 1st and 3rd Saturday of every month.  Must not qualify for public or private insurance (i.e. Medicaid, Medicare, [place] Health Choice, Veterans' Benefits)  Household income should be no more than 200% of the poverty level The clinic cannot treat you if you are pregnant or think you are pregnant  Sexually transmitted diseases are not treated at the clinic.    Dental Care: Organization         Address  Phone  Notes  [facility] Department of [facility] [place] [phone] Accepts children up to age 6 who are enrolled in [place] or [place]; pregnant women with a Medicaid card; and children who have applied for Medicaid or [place] Health  Choice, but were declined, whose parents can pay a reduced fee at time of service.  [facility] Department of [facility]  [address], [place] [phone] Accepts children up to age 16 who are enrolled in [place] or [place]; pregnant women  with a Medicaid card; and children who have applied for Medicaid or [place] Health Choice, but were declined, whose parents can pay a reduced fee at time of service.  [place] Adult Dental Access PROGRAM  [place] [phone] Patients are seen by appointment only. Walk-ins are not accepted. [place] will see patients 3 years of age and older. Monday - Tuesday (8am-5pm) Most Wednesdays (8:30-5pm) $30 per visit, cash only  [facility] Access [facility]  [address], [facility] [phone] Patients are seen by appointment only. Walk-ins are not accepted. [place] will see patients 72 years of age and older. One Wednesday Evening (Monthly: Volunteer Based).  $30 per visit, cash only  [place]  [phone] for adults; Children under age 71, call Graduate Pediatric Dentistry at [phone]. Children aged 69-14, please call [phone] to request a pediatric application.  Dental services are provided in all areas of dental care including fillings, crowns and bridges, complete and partial dentures, implants, gum treatment, root canals, and extractions. Preventive care is also provided. Treatment is provided to both adults and children. Patients are selected via a lottery and there is often a waiting list.   [facility] [address], [place]  [phone] [email or web address]   [facility] [address] [place], [place] [phone], Ext. [phone] Second and Fourth Thursday of each month, opens at 6:30 AM; Clinic ends at 9 AM.  Patients are seen on a first-come first-served basis, and a limited number are seen during each  clinic.   [facility]  [address] Hillard Danker [place], [place] [phone]   Eligibility Requirements You must have lived in [place], [place], or [place] counties for at least the last three months.   You cannot be eligible for state or federal sponsored [employer], including [employer], [place], or [employer].   You generally cannot be eligible for healthcare insurance through your employer.    How to apply: Eligibility screenings are held every Tuesday and Wednesday afternoon from 1:00 pm until 4:00 pm. You do not need an appointment for the interview!  [facility] [address], [place], [place]   [place]  [place] Department  [place]  [phone]    Behavioral Health Resources in the Community: Intensive Outpatient Programs Organization         Address  Phone  Notes  [facility]. [address] [place], [place] [phone]   [facility]patient [address], [place], [place]   ADS: Alcohol & Drug Svcs [address], [place], [place]   [place] [address],  [employer], [place] or [phone]   Substance Abuse Resources Organization         Address  Phone  Notes  Alcohol and Drug Services  [phone]   [place]  [phone]   [facility] [place]   Chinita Pester  [phone]   Residential & Outpatient Substance Abuse Program  [phone]   Psychological Services Organization         Address  Phone  Notes  [facility]  [place]  [phone]   [place] [address] [place] or [phone]    Mobile Crisis Teams Organization  Address  Phone  Notes  [facility]rapeutic Alternatives, Mobile  Crisis Care Unit  [phone]   [facility]  [address] [place], [place]   [facility] [address], [place] [phone]    Self-Help/Support Groups Organization         Address  Phone             Notes  [place] of [place] - variety of support groups  [place] Call for more information  Narcotics Anonymous (NA), [facility] [address], [employer] [place]  2 meetings at this location   Special educational needs teacher         Address  Phone  Notes  [facility]idential [facility] [place],    [place]  [phone]   [facility]  [address] [place] [phone], [place], [place]   [place], [place] [phone] Admissions: 8am-3pm M-F  Incentives Substance [place] [phone],    [place], [place] [phone]   [facility] [address] [place], [place], [place]   [facility] [address],  [place], [place]   [facility] Programs - Intensive Outpatient [address] Kristeen [address], [place], [place]   [facility] [place].,  [place], [place] [phone] or [phone]   [facility] (RTS) [address], [place], [place] Accepts Medicaid  [facility] [place] [address]  [place] [phone] Substance Abuse/Addiction Treatment   [facility] Organization         Address  Phone  Notes  [facility]  [phone]   Domenic Schwab, PhD [address] Arlis Porta [place], [place]   [phone] or [phone]   [place], [place] [phone]   [facility] [address], [place], [place] [phone] Insurance/Medicaid/sponsorship through [facility] and Families [address], [address]  [name]                                    [place], [place] [phone] [place] [address], [place] [phone]    Dr. Adele Schilder  [phone]   [facility] Dept. 1) [address], [place] 2) [place] 3)  [place] [address], [place] [phone]  [phone]   [place] [phone] or [phone] (After Hours)

## 2015-05-24 NOTE — ED Provider Notes (Signed)
CSN: [id number]     Arrival date & time 05/24/15  0218 History   First MD Initiated Contact with Patient 05/24/15 0225     This chart was scribed for Julianne Rice, MD by Forrestine Him, ED Scribe. This patient was seen in room [facility] and the patient's care was started 2:42 AM.   Chief Complaint  Patient presents with  . Chest Pain  . Alcohol Intoxication   The history is provided by the patient. No language interpreter was used.   LEVEL 5 CAVEAT DUE TO CONDITION  HPI Comments: Darrell [name] is a 54 y.o. male with a PMHx of hepatitis C, MI, tuberculosis, alcohol abuse, and heroin abuse who presents to the Emergency Department for alcohol intoxication this evening. Brought in by EMS after a call out to [employer]. En route to department, pt reported chest pain. He was given 324 ASA prior to arrival. Pt has been evaluated 6 times in department in last month. No known allergies to medications.   Past Medical History  Diagnosis Date  . Hepatitis C   . Tuberculosis   . Myocardial infarction   . Anxiety   . Chronic pain   . Alcohol abuse   . History of heroin abuse    Past Surgical History  Procedure Laterality Date  . Coronary stent placement     Family History  Problem Relation Age of Onset  . Heart attack      paternal uncle died MI 75   . Cancer      lung-maternal GM  . Cystic fibrosis      mom. died with pna  . Alcohol abuse      brother   History  Substance Use Topics  . Smoking status: Current Every Day Smoker -- 2.00 packs/day    Types: Cigarettes  . Smokeless tobacco: Never Used     Comment: trying to quit  . Alcohol Use: Yes     Comment: daily  alcohol   ale ,liquor    Review of Systems  Unable to perform ROS: Other  Constitutional: Negative for fever and chills.  Respiratory: Negative for cough and shortness of breath.   Cardiovascular: Positive for chest pain. Negative for palpitations and leg swelling.  Gastrointestinal: Negative for nausea and  abdominal pain.  Musculoskeletal: Negative for back pain, neck pain and neck stiffness.  Skin: Negative for rash and wound.  Neurological: Negative for dizziness, weakness, light-headedness, numbness and headaches.  All other systems reviewed and are negative.     Allergies  Review of patient's allergies indicates no known allergies.  Home Medications   Prior to Admission medications   Medication Sig Start Date End Date Taking? Authorizing Provider  nitroGLYCERIN (NITROSTAT) 0.4 MG SL tablet Place 1 tablet (0.4 mg total) under the tongue every 5 (five) minutes as needed for chest pain. 05/08/15  Yes Rushil Sherrye Payor, MD  amLODipine (NORVASC) 5 MG tablet Take 1 tablet (5 mg total) by mouth daily. Patient not taking: Reported on 05/24/2015 05/08/15   Riccardo Dubin, MD  aspirin EC 81 MG EC tablet Take 1 tablet (81 mg total) by mouth daily. Patient not taking: Reported on 05/24/2015 05/08/15   Riccardo Dubin, MD  atorvastatin (LIPITOR) 40 MG tablet Take 1 tablet (40 mg total) by mouth daily at 6 PM. Patient not taking: Reported on 05/24/2015 05/08/15   Riccardo Dubin, MD  cephALEXin (KEFLEX) 500 MG capsule Take 1 capsule (500 mg total) by mouth 3 (three) times daily.  Patient not taking: Reported on 05/24/2015 05/13/15   Rolland Porter, MD  clopidogrel (PLAVIX) 75 MG tablet Take 75 mg by mouth daily. Patient states that is currently not taking any of his medications.    Historical Provider, MD  polymixin-bacitracin (POLYSPORIN) 500-10000 UNIT/GM OINT ointment Apply 1 application topically 2 (two) times daily. Patient not taking: Reported on 05/24/2015 05/08/15   Riccardo Dubin, MD   Triage Vitals: BP 120/79 mmHg  Pulse 100  Temp(Src) 97.5 F (36.4 C) (Oral)  Resp 14  Ht 6' (1.829 m)  Wt 189 lb (85.73 kg)  BMI 25.63 kg/m2  SpO2 92%   Physical Exam  Constitutional: He appears well-developed and well-nourished. No distress.  HENT:  Head: Normocephalic and atraumatic.  Mouth/Throat: Oropharynx is  clear and moist. No oropharyngeal exudate.  Eyes: EOM are normal. Pupils are equal, round, and reactive to light.  Neck: Normal range of motion. Neck supple.  Cardiovascular: Normal rate and regular rhythm.   Pulmonary/Chest: Effort normal and breath sounds normal. No respiratory distress. He has no wheezes. He has no rales. He exhibits no tenderness.  Abdominal: Soft. Bowel sounds are normal. He exhibits no distension and no mass. There is no tenderness. There is no rebound and no guarding.  Musculoskeletal: Normal range of motion. He exhibits no edema or tenderness.  Neurological:  Obvious intoxicated. Moving all extremities.  Skin: Skin is warm and dry. No rash noted. No erythema.  Psychiatric: He has a normal mood and affect. His behavior is normal.  Nursing note and vitals reviewed.   ED Course  Procedures (including critical care time)  DIAGNOSTIC STUDIES: Oxygen Saturation is 95% on RA, adequate by my interpretation.    COORDINATION OF CARE: 2:43 AM- Will order CXR, BMP, EKG, and i-stat troponin. Discussed treatment plan with pt at bedside and pt agreed to plan.     Labs Review Labs Reviewed  CBC - Abnormal; Notable for the following:    Platelets 145 (*)    All other components within normal limits  BASIC METABOLIC PANEL - Abnormal; Notable for the following:    Glucose, Bld 106 (*)    BUN <5 (*)    Calcium 8.1 (*)    All other components within normal limits  Randolm Idol, ED    Imaging Review Dg Chest Port 1 View  05/24/2015   CLINICAL DATA:  Acute onset of generalized chest pain. Initial encounter.  EXAM: PORTABLE CHEST - 1 VIEW  COMPARISON:  Chest radiograph performed 05/07/2015  FINDINGS: The lungs are well-aerated. Peribronchial thickening is noted. There is no evidence of focal opacification, pleural effusion or pneumothorax.  The cardiomediastinal silhouette is borderline normal in size. No acute osseous abnormalities are seen.  IMPRESSION: Peribronchial  thickening noted; lungs otherwise clear.   Electronically Signed   By: Garald Balding M.D.   On: 05/24/2015 03:02     EKG Interpretation   Date/Time:  Sunday May 24 2015 02:26:28 EDT Ventricular Rate:  89 PR Interval:  133 QRS Duration: 101 QT Interval:  393 QTC Calculation: 478 R Axis:   68 Text Interpretation:  Sinus rhythm Borderline prolonged QT interval  Confirmed by Lita Mains  MD, Eleuterio Dollar ([id number]) on 05/24/2015 4:01:06 AM      MDM   Final diagnoses:  Alcohol intoxication, uncomplicated  Atypical chest pain    Patient presents with acute alcohol intoxication and a complaint of chest pain. He's been evaluated in the emergency department multiple times for similar presenting symptoms.  Patient is now  more alert. Negative troponin 2. EKG without any concerning findings. As stated previously multiple workups for similar complaints. Will be given outpatient referral to cardiology and detox facilities. Return precautions given.  Julianne Rice, MD 05/24/15 [phone]

## 2015-05-24 NOTE — ED Notes (Signed)
Pt was brought in by EMS. EMS was called out by [employer] for alcohol intoxication. Pt then reported to EMS that he was having chest pain. Pt given 324 ASA en route. Pt is belligerent. Pt stating to the RN that he wants help with his alcoholism.

## 2015-06-12 ENCOUNTER — Encounter (HOSPITAL_COMMUNITY): Payer: Self-pay | Admitting: Emergency Medicine

## 2015-06-12 ENCOUNTER — Emergency Department (HOSPITAL_COMMUNITY)
Admission: EM | Admit: 2015-06-12 | Discharge: 2015-06-12 | Disposition: A | Discharge status: Home / Self Care | Payer: BLUE CROSS/BLUE SHIELD | Attending: Emergency Medicine | Admitting: Emergency Medicine

## 2015-06-12 DIAGNOSIS — Z9861 Coronary angioplasty status: Secondary | ICD-10-CM | POA: Insufficient documentation

## 2015-06-12 DIAGNOSIS — Z720 Tobacco use: Secondary | ICD-10-CM | POA: Insufficient documentation

## 2015-06-12 DIAGNOSIS — G8929 Other chronic pain: Secondary | ICD-10-CM | POA: Insufficient documentation

## 2015-06-12 DIAGNOSIS — F1910 Other psychoactive substance abuse, uncomplicated: Secondary | ICD-10-CM

## 2015-06-12 DIAGNOSIS — F329 Major depressive disorder, single episode, unspecified: Secondary | ICD-10-CM | POA: Insufficient documentation

## 2015-06-12 DIAGNOSIS — Z8619 Personal history of other infectious and parasitic diseases: Secondary | ICD-10-CM | POA: Insufficient documentation

## 2015-06-12 DIAGNOSIS — Z8611 Personal history of tuberculosis: Secondary | ICD-10-CM | POA: Insufficient documentation

## 2015-06-12 DIAGNOSIS — F10229 Alcohol dependence with intoxication, unspecified: Secondary | ICD-10-CM | POA: Insufficient documentation

## 2015-06-12 DIAGNOSIS — I252 Old myocardial infarction: Secondary | ICD-10-CM | POA: Insufficient documentation

## 2015-06-12 DIAGNOSIS — F10129 Alcohol abuse with intoxication, unspecified: Secondary | ICD-10-CM

## 2015-06-12 NOTE — Discharge Instructions (Signed)
Alcohol Use Disorder °Alcohol use disorder is a mental disorder. It is not a one-time incident of heavy drinking. Alcohol use disorder is the excessive and uncontrollable use of alcohol over time that leads to problems with functioning in one or more areas of daily living. People with this disorder risk harming themselves and others when they drink to excess. Alcohol use disorder also can cause other mental disorders, such as mood and anxiety disorders, and serious physical problems. People with alcohol use disorder often misuse other drugs.  °Alcohol use disorder is common and widespread. Some people with this disorder drink alcohol to cope with or escape from negative life events. Others drink to relieve chronic pain or symptoms of mental illness. People with a family history of alcohol use disorder are at higher risk of losing control and using alcohol to excess.  °SYMPTOMS  °Signs and symptoms of alcohol use disorder may include the following:  °· Consumption of alcohol in larger amounts or over a longer period of time than intended. °· Multiple unsuccessful attempts to cut down or control alcohol use.   °· A great deal of time spent obtaining alcohol, using alcohol, or recovering from the effects of alcohol (hangover). °· A strong desire or urge to use alcohol (cravings).   °· Continued use of alcohol despite problems at work, school, or home because of alcohol use.   °· Continued use of alcohol despite problems in relationships because of alcohol use. °· Continued use of alcohol in situations when it is physically hazardous, such as driving a car. °· Continued use of alcohol despite awareness of a physical or psychological problem that is likely related to alcohol use. Physical problems related to alcohol use can involve the brain, heart, liver, stomach, and intestines. Psychological problems related to alcohol use include intoxication, depression, anxiety, psychosis, delirium, and dementia.   °· The need for  increased amounts of alcohol to achieve the same desired effect, or a decreased effect from the consumption of the same amount of alcohol (tolerance). °· Withdrawal symptoms upon reducing or stopping alcohol use, or alcohol use to reduce or avoid withdrawal symptoms. Withdrawal symptoms include: °· Racing heart. °· Hand tremor. °· Difficulty sleeping. °· Nausea. °· Vomiting. °· Hallucinations. °· Restlessness. °· Seizures. °DIAGNOSIS °Alcohol use disorder is diagnosed through an assessment by your health care provider. Your health care provider may start by asking three or four questions to screen for excessive or problematic alcohol use. To confirm a diagnosis of alcohol use disorder, at least two symptoms must be present within a 12-month period. The severity of alcohol use disorder depends on the number of symptoms: °· Mild--two or three. °· Moderate--four or five. °· Severe--six or more. °Your health care provider may perform a physical exam or use results from lab tests to see if you have physical problems resulting from alcohol use. Your health care provider may refer you to a mental health professional for evaluation. °TREATMENT  °Some people with alcohol use disorder are able to reduce their alcohol use to low-risk levels. Some people with alcohol use disorder need to quit drinking alcohol. When necessary, mental health professionals with specialized training in substance use treatment can help. Your health care provider can help you decide how severe your alcohol use disorder is and what type of treatment you need. The following forms of treatment are available:  °· Detoxification. Detoxification involves the use of prescription medicines to prevent alcohol withdrawal symptoms in the first week after quitting. This is important for people with a history of symptoms   of withdrawal and for heavy drinkers who are likely to have withdrawal symptoms. Alcohol withdrawal can be dangerous and, in severe cases, cause  death. Detoxification is usually provided in a hospital or in-patient substance use treatment facility.  Counseling or talk therapy. Talk therapy is provided by substance use treatment counselors. It addresses the reasons people use alcohol and ways to keep them from drinking again. The goals of talk therapy are to help people with alcohol use disorder find healthy activities and ways to cope with life stress, to identify and avoid triggers for alcohol use, and to handle cravings, which can cause relapse.  Medicines.Different medicines can help treat alcohol use disorder through the following actions:  Decrease alcohol cravings.  Decrease the positive reward response felt from alcohol use.  Produce an uncomfortable physical reaction when alcohol is used (aversion therapy).  Support groups. Support groups are run by people who have quit drinking. They provide emotional support, advice, and guidance. These forms of treatment are often combined. Some people with alcohol use disorder benefit from intensive combination treatment provided by specialized substance use treatment centers. Both inpatient and outpatient treatment programs are available. Document Released: 01/19/2005 Document Revised: 04/28/2014 Document Reviewed: 03/21/2013 [facility] Patient Information 2015 [place], [place]. This information is not intended to replace advice given to you by your health care provider. Make sure you discuss any questions you have with your health care provider.  Polysubstance Abuse When people abuse more than one drug or type of drug it is called polysubstance or polydrug abuse. For example, many smokers also drink alcohol. This is one form of polydrug abuse. Polydrug abuse also refers to the use of a drug to counteract an unpleasant effect produced by another drug. It may also be used to help with withdrawal from another drug. People who take stimulants may become agitated. Sometimes this agitation is  countered with a tranquilizer. This helps protect against the unpleasant side effects. Polydrug abuse also refers to the use of different drugs at the same time.  Anytime drug use is interfering with normal living activities, it has become abuse. This includes problems with family and friends. Psychological dependence has developed when your mind tells you that the drug is needed. This is usually followed by physical dependence which has developed when continuing increases of drug are required to get the same feeling or "high". This is known as addiction or chemical dependency. A person's risk is much higher if there is a history of chemical dependency in the family. SIGNS OF CHEMICAL DEPENDENCY  You have been told by friends or family that drugs have become a problem.  You fight when using drugs.  You are having blackouts (not remembering what you do while using).  You feel sick from using drugs but continue using.  You lie about use or amounts of drugs (chemicals) used.  You need chemicals to get you going.  You are suffering in work performance or in school because of drug use.  You get sick from use of drugs but continue to use anyway.  You need drugs to relate to people or feel comfortable in social situations.  You use drugs to forget problems. "Yes" answered to any of the above signs of chemical dependency indicates there are problems. The longer the use of drugs continues, the greater the problems will become. If there is a family history of drug or alcohol use, it is best not to experiment with these drugs. Continual use leads to tolerance. After tolerance develops  more of the drug is needed to get the same feeling. This is followed by addiction. With addiction, drugs become the most important part of life. It becomes more important to take drugs than participate in the other usual activities of life. This includes relating to friends and family. Addiction is followed by dependency.  Dependency is a condition where drugs are now needed not just to get high, but to feel normal. Addiction cannot be cured but it can be stopped. This often requires outside help and the care of professionals. Treatment centers are listed in the yellow pages under: Cocaine, Narcotics, and Alcoholics Anonymous. Most hospitals and clinics can refer you to a specialized care center. Talk to your caregiver if you need help. Document Released: 08/03/2005 Document Revised: 03/05/2012 Document Reviewed: 12/12/2005 [facility] Patient Information 2015 [place], [place]. This information is not intended to replace advice given to you by your health care provider. Make sure you discuss any questions you have with your health care provider. Behavioral Health Resources in the [facility]  Intensive Outpatient Programs: [facility]      [place]. [place], [place] Both a day and evening program       [facility] Outpatient     [address]        [place], [place] [address] [phone]         ADS: [facility] [place]: [phone] or [phone] [address] [place], [place] [address] [email or web address]  Mobile Crisis Teams:                                        [facility]         Mobile Crisis Care Unit [phone]             [facility] [address] [place] [address] [place] [phone]  Self-Help/Support Groups: [facility] [employer] of support groups [phone] (call for more info)  Narcotics Anonymous (NA) [facility] [address] [place] - 2 meetings at this location  Residential Treatment Programs:  [place]       [facility] [address] [place], [place]  [address] [place]  [address] [place], [place] [address] [phone] Admissions: 8am-3pm M-F  [facility]stance [place]     [address], [place] [address]       [phone]         [facility] [place] [address] Jadene Pierini [place], [place]  [facility] [address] [place], [place]  [facility] - Intensive Outpatient      [address]     [place], [place]  [facility] ([facility]     [place], [place] or [phone]  Residential Treatment Services (RTS)  [place], [place]  [facility] [address]                                               [place]  [facility] Resources: [place] [phone]               General Therapy                                                Domenic Schwab, PhD        [address] [place], [place] [address]         [facility]   [address] [place], [place] [address] [phone]  [facility] [address] [place], [place] [address] [phone] Insurance/Medicaid/sponsorship through [facility] and Families                                              [address] [place]                                        [place], [place] [address]    Therapy/tele-psych/case         [place] [address], [place]  [address]  Adolescent/group home/case management [phone]                                           Rosette Reveal PhD       General  therapy       Insurance   [id number]         Dr. Adele Schilder Insurance [phone] M-F  [place] [facility]/Residential Medicaid, sponsorship [phone]

## 2015-06-12 NOTE — ED Notes (Signed)
Pt states that he wants detox from ETOH. Wants to go to a facility. ETOH currently on board. Alert and oriented.

## 2015-06-12 NOTE — ED Notes (Signed)
Pt given meal and bus pass, d/c instructions reviewed. Pt verbally aggressive about d/c instructions. Explained to patient he is being d/c'd with a list of substance abuse programs to call for assistance.

## 2015-06-12 NOTE — ED Notes (Signed)
Pt ambulated to restroom with minimal assistance, steady gait.

## 2015-06-12 NOTE — ED Provider Notes (Signed)
CSN: [id number]     Arrival date & time 06/12/15  0231 History   First MD Initiated Contact with Patient 06/12/15 0407     Chief Complaint  Patient presents with  . Alcohol Problem     (Consider location/radiation/quality/duration/timing/severity/associated sxs/prior Treatment) HPI 54 year old male presents to the emergency department via EMS with complaint of alcoholism, polysubstance abuse, wish for inpatient detox.  Patient is somnolent upon my evaluation, appears intoxicated.  He wishes me to shoot him as he does not feel he is worth living.  He denies suicidal ideation, however.  He reports that he is just down about his ongoing issues.  He denies any chest pain, shortness of breath, abdominal pain, nausea, vomiting or diarrhea.  Last drank just prior to arrival Past Medical History  Diagnosis Date  . Hepatitis C   . Tuberculosis   . Myocardial infarction   . Anxiety   . Chronic pain   . Alcohol abuse   . History of heroin abuse    Past Surgical History  Procedure Laterality Date  . Coronary stent placement     Family History  Problem Relation Age of Onset  . Heart attack      paternal uncle died MI 70   . Cancer      lung-maternal GM  . Cystic fibrosis      mom. died with pna  . Alcohol abuse      brother   History  Substance Use Topics  . Smoking status: Current Every Day Smoker -- 2.00 packs/day    Types: Cigarettes  . Smokeless tobacco: Never Used     Comment: trying to quit  . Alcohol Use: Yes     Comment: daily  alcohol   ale ,liquor    Review of Systems Level V caveat, intoxication   Allergies  Review of patient's allergies indicates no known allergies.  Home Medications   Prior to Admission medications   Medication Sig Start Date End Date Taking? Authorizing Provider  amLODipine (NORVASC) 5 MG tablet Take 1 tablet (5 mg total) by mouth daily. Patient not taking: Reported on 05/24/2015 05/08/15   Riccardo Dubin, MD  aspirin EC 81 MG EC tablet Take  1 tablet (81 mg total) by mouth daily. Patient not taking: Reported on 05/24/2015 05/08/15   Riccardo Dubin, MD  atorvastatin (LIPITOR) 40 MG tablet Take 1 tablet (40 mg total) by mouth daily at 6 PM. Patient not taking: Reported on 05/24/2015 05/08/15   Riccardo Dubin, MD  cephALEXin (KEFLEX) 500 MG capsule Take 1 capsule (500 mg total) by mouth 3 (three) times daily. Patient not taking: Reported on 05/24/2015 05/13/15   Rolland Porter, MD  clopidogrel (PLAVIX) 75 MG tablet Take 75 mg by mouth daily. Patient states that is currently not taking any of his medications.    Historical Provider, MD  nitroGLYCERIN (NITROSTAT) 0.4 MG SL tablet Place 1 tablet (0.4 mg total) under the tongue every 5 (five) minutes as needed for chest pain. Patient not taking: Reported on 06/12/2015 05/08/15   Riccardo Dubin, MD  polymixin-bacitracin (POLYSPORIN) 500-10000 UNIT/GM OINT ointment Apply 1 application topically 2 (two) times daily. Patient not taking: Reported on 05/24/2015 05/08/15   Riccardo Dubin, MD   BP 131/77 mmHg  Pulse 80  Temp(Src) 98 F (36.7 C) (Oral)  Resp 18  SpO2 96% Physical Exam  Constitutional: He appears well-developed and well-nourished. No distress.  Disheveled, malodorous  HENT:  Head: Normocephalic and atraumatic.  Nose:  Nose normal.  Mouth/Throat: Oropharynx is clear and moist.  Eyes: Conjunctivae and EOM are normal. Pupils are equal, round, and reactive to light.  Neck: Normal range of motion. Neck supple. No JVD present. No tracheal deviation present. No thyromegaly present.  Cardiovascular: Normal rate, regular rhythm, normal heart sounds and intact distal pulses.  Exam reveals no gallop and no friction rub.   No murmur heard. Pulmonary/Chest: Effort normal and breath sounds normal. No stridor. No respiratory distress. He has no wheezes. He has no rales. He exhibits no tenderness.  Abdominal: Soft. Bowel sounds are normal. He exhibits no distension and no mass. There is no tenderness.  There is no rebound and no guarding.  Musculoskeletal: Normal range of motion. He exhibits no edema or tenderness.  Lymphadenopathy:    He has no cervical adenopathy.  Neurological: He displays normal reflexes. He exhibits normal muscle tone. Coordination normal.  Somnolent but arousable  Skin: Skin is warm and dry. No rash noted. No erythema. No pallor.  Psychiatric:  Poor insight and judgment, depression  Nursing note and vitals reviewed.   ED Course  Procedures (including critical care time) Labs Review Labs Reviewed - No data to display  Imaging Review No results found.   EKG Interpretation None      MDM   Final diagnoses:  Alcohol abuse with intoxication  Substance abuse    54 year old male with chronic polysubstance abuse.  I've explained to the patient that we do not do inpatient detox.  He will be given a list of outpatient resources when he is able to ambulate.  Linton Flemings, MD 06/12/15 [phone]

## 2015-06-12 NOTE — ED Notes (Signed)
Bed: WA17 Expected date:  Expected time:  Means of arrival:  Comments: EMS 54 yo male chest pain/wants alcohol detox-cardiac history

## 2015-06-13 ENCOUNTER — Emergency Department
Admission: EM | Admit: 2015-06-13 | Discharge: 2015-06-16 | Disposition: A | Discharge status: Home / Self Care | Payer: Self-pay | Attending: Emergency Medicine | Admitting: Emergency Medicine

## 2015-06-13 ENCOUNTER — Encounter: Payer: Self-pay | Admitting: Emergency Medicine

## 2015-06-13 DIAGNOSIS — F10929 Alcohol use, unspecified with intoxication, unspecified: Secondary | ICD-10-CM

## 2015-06-13 DIAGNOSIS — I129 Hypertensive chronic kidney disease with stage 1 through stage 4 chronic kidney disease, or unspecified chronic kidney disease: Secondary | ICD-10-CM | POA: Insufficient documentation

## 2015-06-13 DIAGNOSIS — Z720 Tobacco use: Secondary | ICD-10-CM | POA: Insufficient documentation

## 2015-06-13 DIAGNOSIS — I10 Essential (primary) hypertension: Secondary | ICD-10-CM

## 2015-06-13 DIAGNOSIS — F10129 Alcohol abuse with intoxication, unspecified: Secondary | ICD-10-CM | POA: Insufficient documentation

## 2015-06-13 DIAGNOSIS — F10939 Alcohol use, unspecified with withdrawal, unspecified: Secondary | ICD-10-CM

## 2015-06-13 DIAGNOSIS — N182 Chronic kidney disease, stage 2 (mild): Secondary | ICD-10-CM | POA: Insufficient documentation

## 2015-06-13 DIAGNOSIS — F1010 Alcohol abuse, uncomplicated: Secondary | ICD-10-CM | POA: Diagnosis present

## 2015-06-13 DIAGNOSIS — F10239 Alcohol dependence with withdrawal, unspecified: Secondary | ICD-10-CM

## 2015-06-13 LAB — COMPREHENSIVE METABOLIC PANEL
ALBUMIN: 4.3 g/dL (ref 3.5–5.0)
ALT: 54 U/L (ref 17–63)
ANION GAP: 13 (ref 5–15)
AST: 67 U/L — ABNORMAL HIGH (ref 15–41)
Alkaline Phosphatase: 70 U/L (ref 38–126)
BILIRUBIN TOTAL: 0.4 mg/dL (ref 0.3–1.2)
BUN: 6 mg/dL (ref 6–20)
CHLORIDE: 104 mmol/L (ref 101–111)
CO2: 22 mmol/L (ref 22–32)
Calcium: 8.8 mg/dL — ABNORMAL LOW (ref 8.9–10.3)
Creatinine, Ser: 0.86 mg/dL (ref 0.61–1.24)
GLUCOSE: 104 mg/dL — AB (ref 65–99)
POTASSIUM: 3.7 mmol/L (ref 3.5–5.1)
Sodium: 139 mmol/L (ref 135–145)
Total Protein: 8.1 g/dL (ref 6.5–8.1)

## 2015-06-13 LAB — CBC
HEMATOCRIT: 47.6 % (ref 40.0–52.0)
HEMOGLOBIN: 15.9 g/dL (ref 13.0–18.0)
MCH: 30.5 pg (ref 26.0–34.0)
MCHC: 33.3 g/dL (ref 32.0–36.0)
MCV: 91.6 fL (ref 80.0–100.0)
Platelets: 236 10*3/uL (ref 150–440)
RBC: 5.2 MIL/uL (ref 4.40–5.90)
RDW: 16.2 % — ABNORMAL HIGH (ref 11.5–14.5)
WBC: 6.8 10*3/uL (ref 3.8–10.6)

## 2015-06-13 LAB — ETHANOL: Alcohol, Ethyl (B): 282 mg/dL — ABNORMAL HIGH (ref <5)

## 2015-06-13 LAB — SALICYLATE LEVEL: Salicylate Lvl: <4 mg/dL (ref 2.8–30.0)

## 2015-06-13 LAB — ACETAMINOPHEN LEVEL: Acetaminophen (Tylenol), Serum: <10 ug/mL — ABNORMAL LOW (ref 10–30)

## 2015-06-13 NOTE — ED Notes (Signed)
Pt. brought in by [employer] for intoxication.  Pt. Is loud and intoxicated.  Information hard to obtain from pt.

## 2015-06-13 NOTE — ED Notes (Signed)
EMS pt picked up outside [facility]; pt was trying to get in for detox; BP 124/68; HR 82; 97% O2 on room air; pt loud and cursing in triage wait area
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
## 2015-06-13 NOTE — BH Assessment (Signed)
Assessment Note  Darrell [name] is an 54 y.o. male. He report to the ED stating "If I don't get any help, I will just kill myself".  He reports that he needs help to stop drinking alcohol.  He reports that he will walk in front of an 18 wheeler if he does not get the help.  He is requesting a 30 day placement.  [name] denied symptoms of depression and anxiety.  He denied having auditory or visual hallucinations.  He denied homicidal ideation or intent. He reports an extensive history of alcohol use and abuse. [name] is reported as having 11 ED visits in the last 6 months.  Axis I: Alcohol Abuse Axis II: Deferred Axis III:  Past Medical History  Diagnosis Date   Hepatitis C    Tuberculosis    Myocardial infarction    Anxiety    Chronic pain    Alcohol abuse    History of heroin abuse    Axis IV: housing problems, other psychosocial or environmental problems, problems related to legal system/crime and problems with primary support group Axis V: 41-50 serious symptoms  Past Medical History:  Past Medical History  Diagnosis Date   Hepatitis C    Tuberculosis    Myocardial infarction    Anxiety    Chronic pain    Alcohol abuse    History of heroin abuse     Past Surgical History  Procedure Laterality Date   Coronary stent placement      Family History:  Family History  Problem Relation Age of Onset   Heart attack      paternal uncle died MI 65    Cancer      lung-maternal GM   Cystic fibrosis      mom. died with pna   Alcohol abuse      brother    Social History:  reports that he has been smoking Cigarettes.  He has been smoking about 2.00 packs per day. He has never used smokeless tobacco. He reports that he drinks alcohol. He reports that he uses illicit drugs (Marijuana).  Additional Social History:  Alcohol / Drug Use History of alcohol / drug use?: Yes Negative Consequences of Use: Financial, Legal, Personal  relationships Substance #1 Name of Substance 1: Alcohol 1 - Age of First Use: 4 1 - Amount (size/oz): 5 to 6 -40 oz beers 1 - Frequency: daily 1 - Last Use / Amount: 06/13/2015  CIWA: CIWA-Ar BP: 129/88 mmHg Pulse Rate: (!) 112 COWS:    Allergies: No Known Allergies  Home Medications:  (Not in a hospital admission)  OB/GYN Status:  No LMP for male patient.  General Assessment Data Location of Assessment: [facility] ED TTS Assessment: In system Is this a Tele or Face-to-Face Assessment?: Face-to-Face Is this an Initial Assessment or a Re-assessment for this encounter?: Initial Assessment Marital status: Single Maiden name: n/a Is patient pregnant?: No Living Arrangements:  (Homeless) Can pt return to current living arrangement?: Yes Admission Status: Voluntary Is patient capable of signing voluntary admission?: Yes Referral Source: MD Insurance type: medicaid  Medical Screening Exam ([place]) Medical Exam completed: Yes  Crisis Care Plan Living Arrangements:  (Homeless) Name of Psychiatrist: none Name of Therapist: None  Education Status Is patient currently in school?: No Current Grade: n/a Highest grade of school patient has completed: n/a Name of school: n/a Contact person: n/a  Risk to self with the past 6 months Suicidal Ideation: Yes-Currently Present (States he  will kill himself if he does not get help) Has patient been a risk to self within the past 6 months prior to admission? : No Suicidal Intent: No Has patient had any suicidal intent within the past 6 months prior to admission? : No Is patient at risk for suicide?: Yes Suicidal Plan?: Yes-Currently Present Has patient had any suicidal plan within the past 6 months prior to admission? : Yes Specify Current Suicidal Plan:  (Walk in front of an 18 wheeler) Access to Means: Yes Specify Access to Suicidal Means: [place] to the highway What has been your use of drugs/alcohol within the last 12  months?: Alcohol usage Previous Attempts/Gestures: No How many times?: 0 Other Self Harm Risks: 0 Triggers for Past Attempts: None known Intentional Self Injurious Behavior: None Family Suicide History: No Recent stressful life event(s):  (homeless) Persecutory voices/beliefs?: No Depression: No Depression Symptoms: Feeling angry/irritable Substance abuse history and/or treatment for substance abuse?: Yes Suicide prevention information given to non-admitted patients: Not applicable  Risk to Others within the past 6 months Homicidal Ideation: No Does patient have any lifetime risk of violence toward others beyond the six months prior to admission? : No Thoughts of Harm to Others: No Current Homicidal Intent: No Current Homicidal Plan: No Access to Homicidal Means: No Identified Victim: None reported History of harm to others?: No Assessment of Violence: In past 6-12 months Violent Behavior Description: fighting  Does patient have access to weapons?: Yes (Comment) Criminal Charges Pending?: Yes Describe Pending Criminal Charges: Trespassing, open containter Does patient have a court date: Yes Court Date: 07/07/15 (07/16/2015) Is patient on probation?: Unknown  Psychosis Hallucinations: None noted Delusions: None noted  Mental Status Report Appearance/Hygiene: Bizarre, In hospital gown Eye Contact: Fair Motor Activity: Unremarkable Speech: Unremarkable Level of Consciousness: Alert Mood: Irritable Affect: Irritable Anxiety Level: None Thought Processes: Flight of Ideas Judgement: Impaired Orientation: Place, Situation Obsessive Compulsive Thoughts/Behaviors: None  Cognitive Functioning Appetite: Fair Sleep: No Change                   Abuse/Neglect Assessment (Assessment to be complete while patient is alone) Physical Abuse: Denies Verbal Abuse: Denies Sexual Abuse: Denies Exploitation of patient/patient's resources: Denies Self-Neglect:  Denies Values / Beliefs Cultural Requests During Hospitalization: None Spiritual Requests During Hospitalization: None              Disposition:  Disposition Initial Assessment Completed for this Encounter: Yes Disposition of Patient: Referred to (Psychiatric Consult)  On Site Evaluation by:   Reviewed with Physician:    Guerry Minors 06/13/2015 11:51 PM

## 2015-06-14 LAB — URINE DRUG SCREEN, QUALITATIVE (ARMC ONLY)
Amphetamines, Ur Screen: NOT DETECTED
Barbiturates, Ur Screen: NOT DETECTED
Benzodiazepine, Ur Scrn: POSITIVE — AB
CANNABINOID 50 NG, UR ~~LOC~~: NOT DETECTED
Cocaine Metabolite,Ur ~~LOC~~: NOT DETECTED
MDMA (Ecstasy)Ur Screen: NOT DETECTED
METHADONE SCREEN, URINE: NOT DETECTED
Opiate, Ur Screen: NOT DETECTED
PHENCYCLIDINE (PCP) UR S: NOT DETECTED
Tricyclic, Ur Screen: NOT DETECTED

## 2015-06-14 NOTE — ED Notes (Signed)
BEHAVIORAL HEALTH ROUNDING Patient sleeping: Yes.   Patient alert and oriented: not applicable Behavior appropriate: Yes.  ; If no, describe:   Nutrition and fluids offered: No Toileting and hygiene offered: No Sitter present: no Law enforcement present: Yes  and ODS    

## 2015-06-14 NOTE — ED Notes (Signed)

## 2015-06-14 NOTE — ED Notes (Signed)
Patient assigned to appropriate care area. Patient oriented to unit/care area: Informed that, for their safety, care areas are designed for safety and monitored by staff at all times; and visiting hours explained to patient. Patient verbalizes understanding, and verbal contract for safety obtained. 

## 2015-06-14 NOTE — ED Notes (Signed)
BEHAVIORAL HEALTH ROUNDING Patient sleeping: No. Patient alert and oriented: yes Behavior appropriate: Yes.  ; If no, describe:  Nutrition and fluids offered: No Toileting and hygiene offered: No Sitter present: not applicable Law enforcement present: Yes  

## 2015-06-14 NOTE — ED Notes (Signed)
BEHAVIORAL HEALTH ROUNDING Patient sleeping: Yes.   Patient alert and oriented: not applicable Behavior appropriate: Yes.  ; If no, describe:   Nutrition and fluids offered: No Toileting and hygiene offered: No Sitter present: no Law enforcement present: Yes  and ODS   ED [place] Is the patient under IVC or is there intent for IVC: No.   Is the patient medically cleared: Yes.   Is there vacancy in the ED BHU: Yes.   Is the population mix appropriate for patient: Yes.   Is the patient awaiting placement in inpatient or outpatient setting: Yes.   Has the patient had a psychiatric consult: Yes.   Survey of unit performed for contraband, proper placement and condition of furniture, tampering with fixtures in bathroom, shower, and each patient room: Yes.  ; Findings: all clear APPEARANCE/BEHAVIOR calm and cooperative NEURO ASSESSMENT Orientation: time, place and person Hallucinations: No.None noted (Hallucinations) Speech: Normal Gait: normal RESPIRATORY ASSESSMENT Normal expansion.  Clear to auscultation.  No rales, rhonchi, or wheezing. CARDIOVASCULAR ASSESSMENT regular rate and rhythm, S1, S2 normal, no murmur, click, rub or gallop GASTROINTESTINAL ASSESSMENT soft, nontender, BS WNL, no r/g EXTREMITIES normal strength, tone, and muscle mass PLAN OF CARE Provide calm/safe environment. Vital signs assessed twice daily. ED BHU Assessment once each 12-hour shift. Collaborate with intake RN daily or as condition indicates. Assure the ED provider has rounded once each shift. Provide and encourage hygiene. Provide redirection as needed. Assess for escalating behavior; address immediately and inform ED provider.  Assess family dynamic and appropriateness for visitation as needed: No.; If necessary, describe findings: not observed Educate the patient/family about BHU procedures/visitation: No.; If necessary, describe findings: NA

## 2015-06-14 NOTE — ED Notes (Signed)
ED BHU PLACEMENT JUSTIFICATION Is the patient under IVC or is there intent for IVC: No. Is the patient medically cleared: Yes.   Is there vacancy in the ED BHU: Yes.   Is the population mix appropriate for patient: Yes.   Is the patient awaiting placement in inpatient or outpatient setting: Yes.   Has the patient had a psychiatric consult: No. Survey of unit performed for contraband, proper placement and condition of furniture, tampering with fixtures in bathroom, shower, and each patient room: Yes.  ; Findings:  APPEARANCE/BEHAVIOR calm, cooperative and adequate rapport can be established NEURO ASSESSMENT Orientation: time, place and person Hallucinations: No.None noted (Hallucinations) Speech: Normal Gait: normal RESPIRATORY ASSESSMENT Normal expansion.  Clear to auscultation.  No rales, rhonchi, or wheezing. CARDIOVASCULAR ASSESSMENT regular rate and rhythm, S1, S2 normal, no murmur, click, rub or gallop GASTROINTESTINAL ASSESSMENT soft, nontender, BS WNL, no r/g EXTREMITIES normal strength, tone, and muscle mass PLAN OF CARE Provide calm/safe environment. Vital signs assessed twice daily. ED BHU Assessment once each 12-hour shift. Collaborate with intake RN daily or as condition indicates. Assure the ED provider has rounded once each shift. Provide and encourage hygiene. Provide redirection as needed. Assess for escalating behavior; address immediately and inform ED provider.  Assess family dynamic and appropriateness for visitation as needed: Yes.  ; If necessary, describe findings:  Educate the patient/family about BHU procedures/visitation: Yes.  ; If necessary, describe findings:  

## 2015-06-14 NOTE — ED Notes (Signed)
BEHAVIORAL HEALTH ROUNDING  Patient sleeping: yes.  Patient alert and oriented: yes  Behavior appropriate: Yes. ; If no, describe:  Nutrition and fluids offered: Yes  Toileting and hygiene offered: Yes  Sitter present: not applicable  Law enforcement present: Yes

## 2015-06-14 NOTE — ED Notes (Signed)
BEHAVIORAL HEALTH ROUNDING Patient sleeping: No. Patient alert and oriented: yes Behavior appropriate: Yes.  ; If no, describe:  Nutrition and fluids offered: Yes  Toileting and hygiene offered: Yes  Sitter present: not applicable Law enforcement present: Yes  

## 2015-06-14 NOTE — ED Notes (Signed)
Pt to toilet and returned to bed, given water.

## 2015-06-14 NOTE — ED Notes (Signed)
Patient assigned to appropriate care area. Patient oriented to unit/care area: Informed that, for their safety, care areas are designed for safety and monitored by security cameras at all times; and visiting hours explained to patient. Patient verbalizes understanding, and verbal contract for safety obtained. 

## 2015-06-14 NOTE — ED Notes (Signed)
BEHAVIORAL HEALTH ROUNDING  Patient sleeping: yes  Patient alert and oriented: yes Behavior appropriate: Yes. ; If no, describe:  Nutrition and fluids offered: Yes  Toileting and hygiene offered: Yes  Sitter present: not applicable  Law enforcement present: Yes

## 2015-06-14 NOTE — ED Notes (Signed)
VOL/Consult pending
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
## 2015-06-14 NOTE — ED Notes (Signed)
BEHAVIORAL HEALTH ROUNDING Patient sleeping: No. Patient alert and oriented: yes Behavior appropriate: No.; If no, describe: inebriated, confrontational Nutrition and fluids offered: Yes  Toileting and hygiene offered: Yes  Sitter present: no Law enforcement present: Yes  and [facility]  ENVIRONMENTAL ASSESSMENT Potentially harmful objects out of patient reach: Yes.   Personal belongings secured: Yes.   Patient dressed in hospital provided attire only: Yes.   Plastic bags out of patient reach: Yes.   Patient care equipment (cords, cables, call bells, lines, and drains) shortened, removed, or accounted for: Yes.   Equipment and supplies removed from bottom of stretcher: Yes.   Potentially toxic materials out of patient reach: Yes.   Sharps container removed or out of patient reach: Yes.

## 2015-06-15 DIAGNOSIS — F10239 Alcohol dependence with withdrawal, unspecified: Secondary | ICD-10-CM

## 2015-06-15 DIAGNOSIS — I10 Essential (primary) hypertension: Secondary | ICD-10-CM

## 2015-06-15 DIAGNOSIS — F10939 Alcohol use, unspecified with withdrawal, unspecified: Secondary | ICD-10-CM

## 2015-06-15 DIAGNOSIS — F10230 Alcohol dependence with withdrawal, uncomplicated: Secondary | ICD-10-CM

## 2015-06-15 MED ORDER — AMLODIPINE BESYLATE 5 MG PO TABS
5.0000 mg | ORAL_TABLET | Freq: Once | ORAL | Status: AC
  Administered 2015-06-15: 5 mg via ORAL
  Filled 2015-06-15: qty 1

## 2015-06-15 MED ORDER — AMLODIPINE BESYLATE 5 MG PO TABS
10.0000 mg | ORAL_TABLET | Freq: Every day | ORAL | Status: DC
  Administered 2015-06-16 (×2): 10 mg via ORAL
  Filled 2015-06-15 (×2): qty 2

## 2015-06-15 NOTE — ED Notes (Signed)
BEHAVIORAL HEALTH ROUNDING Patient sleeping: Yes.   Patient alert and oriented: not applicable Behavior appropriate: Yes.  ; If no, describe:   Nutrition and fluids offered: No Toileting and hygiene offered: No Sitter present: no Law enforcement present: Yes  and ODS    

## 2015-06-15 NOTE — Discharge Instructions (Signed)
Alcohol Intoxication °Alcohol intoxication occurs when the amount of alcohol that a person has consumed impairs his or her ability to mentally and physically function. Alcohol directly impairs the normal chemical activity of the brain. Drinking large amounts of alcohol can lead to changes in mental function and behavior, and it can cause many physical effects that can be harmful.  °Alcohol intoxication can range in severity from mild to very severe. Various factors can affect the level of intoxication that occurs, such as the person's age, gender, weight, frequency of alcohol consumption, and the presence of other medical conditions (such as diabetes, seizures, or heart conditions). Dangerous levels of alcohol intoxication may occur when people drink large amounts of alcohol in a short period (binge drinking). Alcohol can also be especially dangerous when combined with certain prescription medicines or "recreational" drugs. °SIGNS AND SYMPTOMS °Some common signs and symptoms of mild alcohol intoxication include: °· Loss of coordination. °· Changes in mood and behavior. °· Impaired judgment. °· Slurred speech. °As alcohol intoxication progresses to more severe levels, other signs and symptoms will appear. These may include: °· Vomiting. °· Confusion and impaired memory. °· Slowed breathing. °· Seizures. °· Loss of consciousness. °DIAGNOSIS  °Your health care provider will take a medical history and perform a physical exam. You will be asked about the amount and type of alcohol you have consumed. Blood tests will be done to measure the concentration of alcohol in your blood. In many places, your blood alcohol level must be lower than 80 mg/dL (0.08%) to legally drive. However, many dangerous effects of alcohol can occur at much lower levels.  °TREATMENT  °People with alcohol intoxication often do not require treatment. Most of the effects of alcohol intoxication are temporary, and they go away as the alcohol naturally  leaves the body. Your health care provider will monitor your condition until you are stable enough to go home. Fluids are sometimes given through an IV access tube to help prevent dehydration.  °HOME CARE INSTRUCTIONS °· Do not drive after drinking alcohol. °· Stay hydrated. Drink enough water and fluids to keep your urine clear or pale yellow. Avoid caffeine.   °· Only take over-the-counter or prescription medicines as directed by your health care provider.   °SEEK MEDICAL CARE IF:  °· You have persistent vomiting.   °· You do not feel better after a few days. °· You have frequent alcohol intoxication. Your health care provider can help determine if you should see a substance use treatment counselor. °SEEK IMMEDIATE MEDICAL CARE IF:  °· You become shaky or tremble when you try to stop drinking.   °· You shake uncontrollably (seizure).   °· You throw up (vomit) blood. This may be bright red or may look like black coffee grounds.   °· You have blood in your stool. This may be bright red or may appear as a black, tarry, bad smelling stool.   °· You become lightheaded or faint.   °MAKE SURE YOU:  °· Understand these instructions. °· Will watch your condition. °· Will get help right away if you are not doing well or get worse. °Document Released: 09/21/2005 Document Revised: 08/14/2013 Document Reviewed: 05/17/2013 °Exit[employer]® Patient Information ©2015 [employer]. This information is not intended to replace advice given to you by your health care provider. Make sure you discuss any questions you have with your health care provider. ° °Alcohol Use Disorder °Alcohol use disorder is a mental disorder. It is not a one-time incident of heavy drinking. Alcohol use disorder is the excessive and   uncontrollable use of alcohol over time that leads to problems with functioning in one or more areas of daily living. People with this disorder risk harming themselves and others when they drink to excess. Alcohol use disorder also  can cause other mental disorders, such as mood and anxiety disorders, and serious physical problems. People with alcohol use disorder often misuse other drugs.  °Alcohol use disorder is common and widespread. Some people with this disorder drink alcohol to cope with or escape from negative life events. Others drink to relieve chronic pain or symptoms of mental illness. People with a family history of alcohol use disorder are at higher risk of losing control and using alcohol to excess.  °SYMPTOMS  °Signs and symptoms of alcohol use disorder may include the following:  °· Consumption of alcohol in larger amounts or over a longer period of time than intended. °· Multiple unsuccessful attempts to cut down or control alcohol use.   °· A great deal of time spent obtaining alcohol, using alcohol, or recovering from the effects of alcohol (hangover). °· A strong desire or urge to use alcohol (cravings).   °· Continued use of alcohol despite problems at work, school, or home because of alcohol use.   °· Continued use of alcohol despite problems in relationships because of alcohol use. °· Continued use of alcohol in situations when it is physically hazardous, such as driving a car. °· Continued use of alcohol despite awareness of a physical or psychological problem that is likely related to alcohol use. Physical problems related to alcohol use can involve the brain, heart, liver, stomach, and intestines. Psychological problems related to alcohol use include intoxication, depression, anxiety, psychosis, delirium, and dementia.   °· The need for increased amounts of alcohol to achieve the same desired effect, or a decreased effect from the consumption of the same amount of alcohol (tolerance). °· Withdrawal symptoms upon reducing or stopping alcohol use, or alcohol use to reduce or avoid withdrawal symptoms. Withdrawal symptoms include: °¨ Racing heart. °¨ Hand tremor. °¨ Difficulty  sleeping. °¨ Nausea. °¨ Vomiting. °¨ Hallucinations. °¨ Restlessness. °¨ Seizures. °DIAGNOSIS °Alcohol use disorder is diagnosed through an assessment by your health care provider. Your health care provider may start by asking three or four questions to screen for excessive or problematic alcohol use. To confirm a diagnosis of alcohol use disorder, at least two symptoms must be present within a 12-month period. The severity of alcohol use disorder depends on the number of symptoms: °· Mild--two or three. °· Moderate--four or five. °· Severe--six or more. °Your health care provider may perform a physical exam or use results from lab tests to see if you have physical problems resulting from alcohol use. Your health care provider may refer you to a mental health professional for evaluation. °TREATMENT  °Some people with alcohol use disorder are able to reduce their alcohol use to low-risk levels. Some people with alcohol use disorder need to quit drinking alcohol. When necessary, mental health professionals with specialized training in substance use treatment can help. Your health care provider can help you decide how severe your alcohol use disorder is and what type of treatment you need. The following forms of treatment are available:  °· Detoxification. Detoxification involves the use of prescription medicines to prevent alcohol withdrawal symptoms in the first week after quitting. This is important for people with a history of symptoms of withdrawal and for heavy drinkers who are likely to have withdrawal symptoms. Alcohol withdrawal can be dangerous and, in severe cases, cause death. Detoxification   is usually provided in a hospital or in-patient substance use treatment facility. °· Counseling or talk therapy. Talk therapy is provided by substance use treatment counselors. It addresses the reasons people use alcohol and ways to keep them from drinking again. The goals of talk therapy are to help people with alcohol  use disorder find healthy activities and ways to cope with life stress, to identify and avoid triggers for alcohol use, and to handle cravings, which can cause relapse. °· Medicines. Different medicines can help treat alcohol use disorder through the following actions: °¨ Decrease alcohol cravings. °¨ Decrease the positive reward response felt from alcohol use. °¨ Produce an uncomfortable physical reaction when alcohol is used (aversion therapy). °· Support groups. Support groups are run by people who have quit drinking. They provide emotional support, advice, and guidance. °These forms of treatment are often combined. Some people with alcohol use disorder benefit from intensive combination treatment provided by specialized substance use treatment centers. Both inpatient and outpatient treatment programs are available. °Document Released: 01/19/2005 Document Revised: 04/28/2014 Document Reviewed: 03/21/2013 °ExitCare® Patient Information ©2015 [employer]. This information is not intended to replace advice given to you by your health care provider. Make sure you discuss any questions you have with your health care provider. ° °

## 2015-06-15 NOTE — ED Notes (Signed)
Pt to NS for milk, offered watered and rules orientation, pt disinterested in both

## 2015-06-15 NOTE — ED Notes (Signed)
BEHAVIORAL HEALTH ROUNDING Patient sleeping: No. Patient alert and oriented: yes Behavior appropriate: Yes.  ; If no, describe:  Nutrition and fluids offered: Yes  Toileting and hygiene offered: Yes  Sitter present: no Law enforcement present: Yes   Pt awaiting psych eval for possible in pt treatment for etoh abuse.

## 2015-06-15 NOTE — ED Notes (Signed)
BEHAVIORAL HEALTH ROUNDING Patient sleeping: No. Patient alert and oriented: yes Behavior appropriate: Yes.  ; If no, describe:  Nutrition and fluids offered: Yes  Toileting and hygiene offered: Yes  Sitter present: no Law enforcement present: Yes  

## 2015-06-15 NOTE — ED Notes (Signed)
BEHAVIORAL HEALTH ROUNDING Patient sleeping: Yes.   Patient alert and oriented: yes Behavior appropriate: Yes.  ; If no, describe:  Nutrition and fluids offered: Yes  Toileting and hygiene offered: Yes  Sitter present: no Law enforcement present: Yes  

## 2015-06-15 NOTE — ED Notes (Signed)

## 2015-06-15 NOTE — ED Notes (Signed)
Pych. Doctor in with pt.

## 2015-06-15 NOTE — ED Notes (Signed)
BEHAVIORAL HEALTH ROUNDING Patient sleeping: Yes.   Patient alert and oriented: no Behavior appropriate: Yes.  ; If no, describe:  Nutrition and fluids offered: Yes  Toileting and hygiene offered: Yes  Sitter present: no Law enforcement present: Yes  
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
## 2015-06-15 NOTE — ED Notes (Signed)
BEHAVIORAL HEALTH ROUNDING Patient sleeping: No. Patient alert and oriented: yes Behavior appropriate: Yes.  ; If no, describe:  Nutrition and fluids offered: Yes  Toileting and hygiene offered: Yes  Sitter present: no Law enforcement present: Yes   Pt ambulates out of room to get more milk to drink.

## 2015-06-15 NOTE — ED Notes (Signed)
BEHAVIORAL HEALTH ROUNDING Patient sleeping: Yes.   Patient alert and oriented: pt sleeping Behavior appropriate: Yes.  ; If no, describe: Nutrition and fluids offered: Yes  Toileting and hygiene offered: Yes  Sitter present: no Law enforcement present: Yes

## 2015-06-15 NOTE — ED Provider Notes (Signed)
Patient is cleared from acute alcohol withdrawal or other acute emergency medical conditions at this time. He is declined at [facility] and banned from their property. He is stable for outpatient follow-up with his doctor he can return here for worsening or worrisome symptoms.  Earleen Newport, MD 06/15/15 [id number]

## 2015-06-16 MED ORDER — DIPHENHYDRAMINE HCL 50 MG PO CAPS
ORAL_CAPSULE | ORAL | Status: AC
  Administered 2015-06-16: 50 mg via ORAL
  Filled 2015-06-16: qty 1

## 2015-06-16 MED ORDER — DIPHENHYDRAMINE HCL 25 MG PO CAPS
50.0000 mg | ORAL_CAPSULE | Freq: Once | ORAL | Status: AC
  Administered 2015-06-16: 50 mg via ORAL

## 2015-06-16 NOTE — ED Notes (Signed)
Patient observed lying in bed with eyes closed  Even, unlabored respirations observed   NAD pt appears to be sleeping  I will continue to monitor along with every 15 minute visual observations and ongoing security camera monitoring    

## 2015-06-16 NOTE — ED Notes (Signed)
ED BHU [place] Is the patient under IVC or is there intent for IVC: No. Is the patient medically cleared: Yes.   Is there vacancy in the ED BHU: yes Is the population mix appropriate for patient: Yes.   Is the patient awaiting placement in inpatient or outpatient setting: No. Has the patient had a psychiatric consult: yes Survey of unit performed for contraband, proper placement and condition of furniture, tampering with fixtures in bathroom, shower, and each patient room: Yes.  ; Findings:  APPEARANCE/BEHAVIOR Calm and cooperative NEURO ASSESSMENT Orientation:  Oriented x3 Hallucinations: No.None noted (Hallucinations) Speech: Normal Gait: normal RESPIRATORY ASSESSMENT Even  unlabored respirations noted  CARDIOVASCULAR ASSESSMENT Regular rate  Pulses equal  Skin warm and dry   GASTROINTESTINAL ASSESSMENT no GI complaint EXTREMITIES Full ROM  PLAN OF CARE Provide calm/safe environment. Vital signs assessed twice daily. ED BHU Assessment once each 12-hour shift. Collaborate with intake RN daily or as condition indicates. Assure the ED provider has rounded once each shift. Provide and encourage hygiene. Provide redirection as needed. Assess for escalating behavior; address immediately and inform ED provider.  Assess family dynamic and appropriateness for visitation as needed: Yes.  ; If necessary, describe findings:  Educate the patient/family about BHU procedures/visitation: Yes.  ; If necessary, describe findings:

## 2015-06-16 NOTE — ED Notes (Signed)
Pt. Requested something for sleep.  Pt. Has discharge paperwork.  Pt. Requested to stay to morning so he can arrange sleeping arrangements at shelter.

## 2015-06-16 NOTE — ED Notes (Signed)
BEHAVIORAL HEALTH ROUNDING  Patient sleeping: Yes.  Patient alert and oriented: no  Behavior appropriate: Yes. ; If no, describe:  Nutrition and fluids offered: No  Toileting and hygiene offered: No  Sitter present: no  Law enforcement present: Yes   

## 2015-06-16 NOTE — ED Notes (Signed)
BEHAVIORAL HEALTH ROUNDING Patient sleeping: No. Patient alert and oriented: yes Behavior appropriate: Yes.  ; If no, describe:  Nutrition and fluids offered: yes Toileting and hygiene offered: Yes  Sitter present: q15 minute observations and security camera monitoring Law enforcement present: Yes  [facility]  

## 2015-06-16 NOTE — ED Notes (Signed)
BEHAVIORAL HEALTH ROUNDING Patient sleeping: Yes.   Patient alert and oriented: yes Behavior appropriate: Yes.  ; If no, describe:  Nutrition and fluids offered: Yes  Toileting and hygiene offered: Yes  Sitter present: no Law enforcement present: Yes  

## 2015-06-16 NOTE — ED Notes (Signed)
Am med administered as ordered  Assessment completed  Pt denies pain  Pt observed with no unusual behavior  Appropriate to stimulation  No verbalized needs or concerns at this time  NAD assessed  Continue to monitor

## 2015-06-16 NOTE — ED Provider Notes (Signed)
-----------------------------------------   8:09 AM on 06/16/2015 -----------------------------------------   BP 148/115 mmHg  Pulse 75  Temp(Src) 98.6 F (37 C) (Oral)  Resp 22  Ht 6' (1.829 m)  Wt 185 lb (83.915 kg)  BMI 25.08 kg/m2  SpO2 99%  The patient had no acute events since last update.  Calm and cooperative at this time.  Disposition is pending per Psychiatry/Behavioral Medicine team recommendations. Per Dr. Carlena Hurl note from yesterday, plan is to reassess today for disposition.     Joanne Gavel, MD 06/16/15 [phone]

## 2015-06-16 NOTE — ED Notes (Signed)
Breakfast provided  Pt awake and ambualtory to the BR  Pt observed with no unusual behavior  Appropriate to stimulation  No verbalized needs or concerns at this time  NAD assessed   Pt with discharge papers - although Clapacs note states that he will reevaluate today    Continue to monitor

## 2015-06-16 NOTE — ED Notes (Signed)
Pt. Requesting information on how to go to [facility].  Pt. Knows he can't go to [facility], Pt. States he had disagreement with staff member at that facility.

## 2015-06-16 NOTE — ED Notes (Signed)
1/1 bags of belongings returned to him and he verbalized that he received all of his belongings back that he came here with  - pt had scissors and a sharp nail file in his pockets - they were held until he was leaving the [place] area   Attempted to review discharge instructions with him and he stated  "I don't need no fucking papers - what I need is money for the bus or money for a beer - I ain't taking no fucking papers."  I tried to give him the information about the shelter and [facility] and he stated  "I have already been there and they ain't worth a damn - shit they are worse than this place - I can't wait to get drunk.  fuck you and your terrible food."  No signature obtained and pt refused all paperwork

## 2015-06-16 NOTE — ED Notes (Signed)

## 2015-06-16 NOTE — ED Notes (Signed)
Lunch provided along with an extra drink  MD Clapacs has rounded through the unit and reports that this pt may be discharged to home   Information about [facility] and the [facility] homeless shelter provided

## 2015-06-16 NOTE — ED Notes (Signed)
Pt. Up using bathroom. 

## 2015-06-17 ENCOUNTER — Emergency Department
Admission: EM | Admit: 2015-06-17 | Discharge: 2015-06-23 | Disposition: A | Discharge status: Home / Self Care | Payer: BLUE CROSS/BLUE SHIELD | Attending: Emergency Medicine | Admitting: Emergency Medicine

## 2015-06-17 DIAGNOSIS — F1010 Alcohol abuse, uncomplicated: Secondary | ICD-10-CM | POA: Diagnosis present

## 2015-06-17 DIAGNOSIS — R768 Other specified abnormal immunological findings in serum: Secondary | ICD-10-CM | POA: Diagnosis present

## 2015-06-17 DIAGNOSIS — F32A Depression, unspecified: Secondary | ICD-10-CM

## 2015-06-17 DIAGNOSIS — F329 Major depressive disorder, single episode, unspecified: Secondary | ICD-10-CM | POA: Insufficient documentation

## 2015-06-17 DIAGNOSIS — F10120 Alcohol abuse with intoxication, uncomplicated: Secondary | ICD-10-CM | POA: Insufficient documentation

## 2015-06-17 DIAGNOSIS — I10 Essential (primary) hypertension: Secondary | ICD-10-CM | POA: Diagnosis present

## 2015-06-17 DIAGNOSIS — F10920 Alcohol use, unspecified with intoxication, uncomplicated: Secondary | ICD-10-CM

## 2015-06-17 DIAGNOSIS — Z79899 Other long term (current) drug therapy: Secondary | ICD-10-CM | POA: Insufficient documentation

## 2015-06-17 DIAGNOSIS — Z720 Tobacco use: Secondary | ICD-10-CM | POA: Insufficient documentation

## 2015-06-17 LAB — COMPREHENSIVE METABOLIC PANEL
ALK PHOS: 57 U/L (ref 38–126)
ALT: 40 U/L (ref 17–63)
AST: 39 U/L (ref 15–41)
Albumin: 3.8 g/dL (ref 3.5–5.0)
Anion gap: 11 (ref 5–15)
BUN: 16 mg/dL (ref 6–20)
CALCIUM: 8.7 mg/dL — AB (ref 8.9–10.3)
CHLORIDE: 101 mmol/L (ref 101–111)
CO2: 23 mmol/L (ref 22–32)
Creatinine, Ser: 1.14 mg/dL (ref 0.61–1.24)
GFR calc non Af Amer: >60 mL/min (ref >60)
Glucose, Bld: 125 mg/dL — ABNORMAL HIGH (ref 65–99)
Potassium: 3.4 mmol/L — ABNORMAL LOW (ref 3.5–5.1)
SODIUM: 135 mmol/L (ref 135–145)
Total Bilirubin: 0.3 mg/dL (ref 0.3–1.2)
Total Protein: 7.3 g/dL (ref 6.5–8.1)

## 2015-06-17 LAB — URINALYSIS COMPLETE WITH MICROSCOPIC (ARMC ONLY)
BACTERIA UA: NONE SEEN
Bilirubin Urine: NEGATIVE
Glucose, UA: NEGATIVE mg/dL
HGB URINE DIPSTICK: NEGATIVE
Ketones, ur: NEGATIVE mg/dL
Leukocytes, UA: NEGATIVE
Nitrite: NEGATIVE
Protein, ur: NEGATIVE mg/dL
Specific Gravity, Urine: 1.012 (ref 1.005–1.030)
pH: 5 (ref 5.0–8.0)

## 2015-06-17 LAB — URINE DRUG SCREEN, QUALITATIVE (ARMC ONLY)
AMPHETAMINES, UR SCREEN: NOT DETECTED
BENZODIAZEPINE, UR SCRN: POSITIVE — AB
Barbiturates, Ur Screen: NOT DETECTED
Cannabinoid 50 Ng, Ur ~~LOC~~: NOT DETECTED
Cocaine Metabolite,Ur ~~LOC~~: NOT DETECTED
MDMA (Ecstasy)Ur Screen: NOT DETECTED
METHADONE SCREEN, URINE: NOT DETECTED
OPIATE, UR SCREEN: NOT DETECTED
Phencyclidine (PCP) Ur S: NOT DETECTED
Tricyclic, Ur Screen: NOT DETECTED

## 2015-06-17 LAB — CBC
HEMATOCRIT: 45.6 % (ref 40.0–52.0)
HEMOGLOBIN: 15.3 g/dL (ref 13.0–18.0)
MCH: 30.9 pg (ref 26.0–34.0)
MCHC: 33.5 g/dL (ref 32.0–36.0)
MCV: 92.2 fL (ref 80.0–100.0)
Platelets: 174 10*3/uL (ref 150–440)
RBC: 4.94 MIL/uL (ref 4.40–5.90)
RDW: 16.3 % — AB (ref 11.5–14.5)
WBC: 6.6 10*3/uL (ref 3.8–10.6)

## 2015-06-17 LAB — ETHANOL: Alcohol, Ethyl (B): 210 mg/dL — ABNORMAL HIGH (ref <5)

## 2015-06-17 LAB — ACETAMINOPHEN LEVEL

## 2015-06-17 LAB — SALICYLATE LEVEL: Salicylate Lvl: <4 mg/dL (ref 2.8–30.0)

## 2015-06-17 MED ORDER — ASPIRIN 81 MG PO CHEW
81.0000 mg | CHEWABLE_TABLET | Freq: Every day | ORAL | Status: DC
  Administered 2015-06-17 – 2015-06-23 (×7): 81 mg via ORAL
  Filled 2015-06-17 (×7): qty 1

## 2015-06-17 MED ORDER — LORAZEPAM 2 MG PO TABS
0.0000 mg | ORAL_TABLET | Freq: Four times a day (QID) | ORAL | Status: AC
  Administered 2015-06-17 (×2): 2 mg via ORAL
  Administered 2015-06-18: 1 mg via ORAL

## 2015-06-17 MED ORDER — ATORVASTATIN CALCIUM 20 MG PO TABS
40.0000 mg | ORAL_TABLET | Freq: Every day | ORAL | Status: DC
  Administered 2015-06-18 – 2015-06-22 (×5): 40 mg via ORAL
  Filled 2015-06-17 (×3): qty 2

## 2015-06-17 MED ORDER — AMLODIPINE BESYLATE 5 MG PO TABS
ORAL_TABLET | ORAL | Status: AC
  Administered 2015-06-17: 5 mg via ORAL
  Filled 2015-06-17: qty 1

## 2015-06-17 MED ORDER — CLOPIDOGREL BISULFATE 75 MG PO TABS
75.0000 mg | ORAL_TABLET | Freq: Every day | ORAL | Status: DC
  Administered 2015-06-17 – 2015-06-23 (×7): 75 mg via ORAL
  Filled 2015-06-17 (×6): qty 1

## 2015-06-17 MED ORDER — LORAZEPAM 2 MG PO TABS
ORAL_TABLET | ORAL | Status: AC
  Filled 2015-06-17: qty 1

## 2015-06-17 MED ORDER — AMLODIPINE BESYLATE 5 MG PO TABS
5.0000 mg | ORAL_TABLET | Freq: Every day | ORAL | Status: DC
  Administered 2015-06-17 – 2015-06-23 (×7): 5 mg via ORAL
  Filled 2015-06-17 (×7): qty 1

## 2015-06-17 MED ORDER — VITAMIN B-1 100 MG PO TABS
100.0000 mg | ORAL_TABLET | Freq: Every day | ORAL | Status: DC
  Administered 2015-06-17 – 2015-06-23 (×7): 100 mg via ORAL
  Filled 2015-06-17 (×6): qty 1

## 2015-06-17 MED ORDER — CLOPIDOGREL BISULFATE 75 MG PO TABS
ORAL_TABLET | ORAL | Status: AC
  Administered 2015-06-17: 75 mg via ORAL
  Filled 2015-06-17: qty 1

## 2015-06-17 MED ORDER — LORAZEPAM 2 MG PO TABS
ORAL_TABLET | ORAL | Status: AC
  Administered 2015-06-17: 2 mg via ORAL
  Filled 2015-06-17: qty 1

## 2015-06-17 MED ORDER — ASPIRIN 81 MG PO CHEW
CHEWABLE_TABLET | ORAL | Status: AC
  Administered 2015-06-17: 81 mg via ORAL
  Filled 2015-06-17: qty 1

## 2015-06-17 MED ORDER — VITAMIN B-1 100 MG PO TABS
ORAL_TABLET | ORAL | Status: AC
  Administered 2015-06-17: 100 mg via ORAL
  Filled 2015-06-17: qty 1
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
## 2015-06-17 NOTE — ED Notes (Signed)
Pt to ED via Law Enforcement for ETOH intoxication and SI. Pt is well known to this facility for numerous previous visits for the same reason.

## 2015-06-17 NOTE — ED Notes (Signed)
BEHAVIORAL HEALTH ROUNDING Patient sleeping: Yes.   Patient alert and oriented: yes Behavior appropriate: Yes.  ; If no, describe:  Nutrition and fluids offered: Yes  Toileting and hygiene offered: Yes  Sitter present: yes Law enforcement present: Yes  

## 2015-06-17 NOTE — ED Notes (Signed)
BEHAVIORAL HEALTH ROUNDING Patient sleeping: No. Patient alert and oriented: yes Behavior appropriate: Yes.  ; If no, describe:  Nutrition and fluids offered: Yes  Toileting and hygiene offered: Yes  Sitter present: yes Law enforcement present: Yes  

## 2015-06-17 NOTE — ED Notes (Signed)

## 2015-06-17 NOTE — BH Assessment (Signed)
Assessment Note  Darrell [name] is an 54 y.o. male. He report to the ED stating "I need help, I tried to jump in front of car and it swerved, I tried to jump off a bridge, but 3 people pulled me off, I want some inpatient help for at least 30 days or I will just kill myself".  He reports that he wants help to stop drinking alcohol.   Darrell [name] denied symptoms of depression and anxiety.  He denied having auditory or visual hallucinations.  He denied homicidal ideation or intent. He reports an extensive history of alcohol use and abuse. He reports having 2 prior inpatient placements at [facility]. This is Darrell [name] 12th ED visits in 6 months  Axis I: Alcohol Abuse Axis II: Deferred Axis III:  Past Medical History  Diagnosis Date   Hepatitis C    Tuberculosis    Myocardial infarction    Anxiety    Chronic pain    Alcohol abuse    History of heroin abuse    Axis IV: housing problems and other psychosocial or environmental problems Axis V: 51-60 moderate symptoms  Past Medical History:  Past Medical History  Diagnosis Date   Hepatitis C    Tuberculosis    Myocardial infarction    Anxiety    Chronic pain    Alcohol abuse    History of heroin abuse     Past Surgical History  Procedure Laterality Date   Coronary stent placement      Family History:  Family History  Problem Relation Age of Onset   Heart attack      paternal uncle died MI 71    Cancer      lung-maternal GM   Cystic fibrosis      mom. died with pna   Alcohol abuse      brother    Social History:  reports that he has been smoking Cigarettes.  He has been smoking about 2.00 packs per day. He has never used smokeless tobacco. He reports that he drinks alcohol. He reports that he uses illicit drugs (Marijuana).  Additional Social History:  Alcohol / Drug Use History of alcohol / drug use?: Yes Negative Consequences of Use: Personal relationships, Financial Substance #1 Name of Substance  1: Alcohol 1 - Age of First Use: 4 1 - Amount (size/oz): 6- 40 oz beers 1 - Frequency: daily 1 - Last Use / Amount: 06/13/2015  CIWA: CIWA-Ar BP: 128/88 mmHg Pulse Rate: 92 Nausea and Vomiting: no nausea and no vomiting Tactile Disturbances: none Tremor: no tremor Auditory Disturbances: not present Paroxysmal Sweats: no sweat visible Visual Disturbances: not present Anxiety: no anxiety, at ease Headache, Fullness in Head: none present Agitation: normal activity Orientation and Clouding of Sensorium: disoriented for data by no more than 2 calendar days CIWA-Ar Total: 2 COWS:    Allergies: No Known Allergies  Home Medications:  (Not in a hospital admission)  OB/GYN Status:  No LMP for male patient.  General Assessment Data Location of Assessment: [facility] ED TTS Assessment: In system Is this a Tele or Face-to-Face Assessment?: Face-to-Face Is this an Initial Assessment or a Re-assessment for this encounter?: Initial Assessment Marital status: Single Maiden name: n/a Is patient pregnant?: No Pregnancy Status: No Living Arrangements: Non-relatives/Friends Can pt return to current living arrangement?: Yes Admission Status: Involuntary Is patient capable of signing voluntary admission?: Yes Referral Source: MD Insurance type: Medicaid - out of state  Medical Screening Exam ([place]) Medical  Exam completed: Yes  Crisis Care Plan Living Arrangements: Non-relatives/Friends Name of Psychiatrist: none Name of Therapist: None  Education Status Is patient currently in school?: No Current Grade: n/a Highest grade of school patient has completed: n/a Name of school: n/a Contact person: n/a  Risk to self with the past 6 months Suicidal Ideation: Yes-Currently Present Has patient been a risk to self within the past 6 months prior to admission? : Yes Suicidal Intent: Yes-Currently Present (States that he will kill himself if he does not get help) Has patient had any  suicidal intent within the past 6 months prior to admission? : Yes Is patient at risk for suicide?: Yes Suicidal Plan?: Yes-Currently Present Has patient had any suicidal plan within the past 6 months prior to admission? : Yes Specify Current Suicidal Plan:  (Jump in front of a moving vehicle) Access to Means: Yes Specify Access to Suicidal Means:  (Access to roadways) What has been your use of drugs/alcohol within the last 12 months?: drinking alcohol daily Previous Attempts/Gestures: Yes (Stated that he attempted to jump in front of a car , but it ) How many times?: 1 Other Self Harm Risks: none Triggers for Past Attempts: None known Intentional Self Injurious Behavior: None Family Suicide History: No Recent stressful life event(s):  (Homelessness) Persecutory voices/beliefs?: No Depression: No Depression Symptoms:  (None) Substance abuse history and/or treatment for substance abuse?: Yes Suicide prevention information given to non-admitted patients: Not applicable (Patient to be evaluated by psychiatrist)  Risk to Others within the past 6 months Homicidal Ideation: No Does patient have any lifetime risk of violence toward others beyond the six months prior to admission? : No Thoughts of Harm to Others: No Current Homicidal Intent: No Current Homicidal Plan: No Access to Homicidal Means: No Identified Victim: None reported History of harm to others?: No Assessment of Violence: None Noted Violent Behavior Description: Arguementative Does patient have access to weapons?: No (Not on site) Criminal Charges Pending?: Yes Describe Pending Criminal Charges: Trespassing, Open container Does patient have a court date: Yes Court Date: 07/07/15 Is patient on probation?: Unknown  Psychosis Hallucinations: None noted Delusions: None noted  Mental Status Report Appearance/Hygiene: In scrubs Eye Contact: Fair Motor Activity: Restlessness, Hyperactivity Speech: Unremarkable Level of  Consciousness: Alert Mood: Irritable Affect: Irritable Anxiety Level: None Thought Processes: Coherent Judgement: Unimpaired Orientation: Person, Place, Situation Obsessive Compulsive Thoughts/Behaviors: None  Cognitive Functioning Appetite: Fair Sleep: No Change     Prior Inpatient Therapy Prior Inpatient Therapy: Yes Prior Therapy Dates: Unsure Prior Therapy Facilty/Provider(s): [facility] Reason for Treatment: Substance Abuse             Abuse/Neglect Assessment (Assessment to be complete while patient is alone) Physical Abuse: Denies Verbal Abuse: Denies Sexual Abuse: Denies Exploitation of patient/patient's resources: Denies Self-Neglect: Denies Values / Beliefs Cultural Requests During Hospitalization: None Spiritual Requests During Hospitalization: None   Advance Directives (For Healthcare) Does patient have an advance directive?: No Would patient like information on creating an advanced directive?: No - patient declined information    Additional Information 1:1 In Past 12 Months?: No     Disposition:  Disposition Initial Assessment Completed for this Encounter: Yes Disposition of Patient: Referred to Patient referred to:  (Psych consult)  On Site Evaluation by:   Reviewed with Physician:    Guerry Minors 06/17/2015 7:09 AM

## 2015-06-17 NOTE — ED Notes (Signed)
ED BHU [place] Is the patient under IVC or is there intent for IVC: Yes.   Is the patient medically cleared: Yes.   Is there vacancy in the ED BHU: Yes.   Is the population mix appropriate for patient: Yes.   Is the patient awaiting placement in inpatient or outpatient setting: Yes.   Has the patient had a psychiatric consult: Yes.   Survey of unit performed for contraband, proper placement and condition of furniture, tampering with fixtures in bathroom, shower, and each patient room: Yes.  ; Findings:  APPEARANCE/BEHAVIOR calm, cooperative and adequate rapport can be established NEURO ASSESSMENT Orientation: time, place and person Hallucinations: No.None noted (Hallucinations) Speech: Normal Gait: normal RESPIRATORY ASSESSMENT Normal expansion.  Clear to auscultation.  No rales, rhonchi, or wheezing. CARDIOVASCULAR ASSESSMENT regular rate and rhythm, S1, S2 normal, no murmur, click, rub or gallop GASTROINTESTINAL ASSESSMENT soft, nontender, BS WNL, no r/g EXTREMITIES normal strength, tone, and muscle mass PLAN OF CARE Provide calm/safe environment. Vital signs assessed twice daily. ED BHU Assessment once each 12-hour shift. Collaborate with intake RN daily or as condition indicates. Assure the ED provider has rounded once each shift. Provide and encourage hygiene. Provide redirection as needed. Assess for escalating behavior; address immediately and inform ED provider.  Assess family dynamic and appropriateness for visitation as needed: Yes.  ; If necessary, describe findings:  Educate the patient/family about BHU procedures/visitation: Yes.  ; If necessary, describe findings:

## 2015-06-17 NOTE — ED Notes (Signed)
ED BHU [place] Is the patient under IVC or is there intent for IVC: Yes.   Is the patient medically cleared: No. Is there vacancy in the ED BHU: Yes.   Is the population mix appropriate for patient: Yes.   Is the patient awaiting placement in inpatient or outpatient setting: No. Has the patient had a psychiatric consult: Yes.   Survey of unit performed for contraband, proper placement and condition of furniture, tampering with fixtures in bathroom, shower, and each patient room: Yes.  ; Findings:  APPEARANCE/BEHAVIOR calm, cooperative and adequate rapport can be established NEURO ASSESSMENT Orientation: time, place and person Hallucinations: No.None noted (Hallucinations) Speech: Normal Gait: normal RESPIRATORY ASSESSMENT Normal expansion.  Clear to auscultation.  No rales, rhonchi, or wheezing. CARDIOVASCULAR ASSESSMENT regular rate and rhythm, S1, S2 normal, no murmur, click, rub or gallop GASTROINTESTINAL ASSESSMENT soft, nontender, BS WNL, no r/g EXTREMITIES normal strength, tone, and muscle mass PLAN OF CARE Provide calm/safe environment. Vital signs assessed twice daily. ED BHU Assessment once each 12-hour shift. Collaborate with intake RN daily or as condition indicates. Assure the ED provider has rounded once each shift. Provide and encourage hygiene. Provide redirection as needed. Assess for escalating behavior; address immediately and inform ED provider.  Assess family dynamic and appropriateness for visitation as needed: Yes.  ; If necessary, describe findings:  Educate the patient/family about BHU procedures/visitation: Yes.  ; If necessary, describe findings:

## 2015-06-17 NOTE — ED Notes (Signed)
BEHAVIORAL HEALTH ROUNDING Patient sleeping: Yes.   Patient alert and oriented: yes Behavior appropriate: Yes.  ; If no, describe:  Nutrition and fluids offered: Yes  Toileting and hygiene offered: Yes  Sitter present: yes Law enforcement present: Yes, [employer] officer 
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
## 2015-06-17 NOTE — ED Notes (Signed)
BEHAVIORAL HEALTH ROUNDING Patient sleeping: Yes.   Patient alert and oriented: not applicable Behavior appropriate: Yes.  ; If no, describe:  Nutrition and fluids offered: Yes  Toileting and hygiene offered: Yes  Sitter present: yes Law enforcement present: Yes [employer] 

## 2015-06-17 NOTE — ED Notes (Signed)
Pt in room

## 2015-06-17 NOTE — ED Notes (Signed)
Resumed care from [facility].    Pt from [facility] [place].  Pt alert , calm and cooperative.

## 2015-06-17 NOTE — ED Notes (Signed)
BEHAVIORAL HEALTH ROUNDING Patient sleeping: Yes.   Patient alert and oriented: yes Behavior appropriate: Yes.  ; If no, describe:  Nutrition and fluids offered: Yes  Toileting and hygiene offered: Yes  Sitter present: yes Law enforcement present: Yes [employer]  

## 2015-06-17 NOTE — ED Notes (Signed)
Pt laying in bed with no distress noted, will continue to monitor.  

## 2015-06-18 DIAGNOSIS — F1010 Alcohol abuse, uncomplicated: Secondary | ICD-10-CM

## 2015-06-18 MED ORDER — CLOPIDOGREL BISULFATE 75 MG PO TABS
ORAL_TABLET | ORAL | Status: AC
  Administered 2015-06-18: 75 mg via ORAL
  Filled 2015-06-18: qty 1

## 2015-06-18 MED ORDER — LORAZEPAM 1 MG PO TABS
ORAL_TABLET | ORAL | Status: AC
  Administered 2015-06-18: 1 mg via ORAL
  Filled 2015-06-18: qty 1

## 2015-06-18 NOTE — ED Notes (Signed)
Pt. Noted in room. No complaints or concerns voiced. No distress or abnormal behavior noted. Will continue to monitor with security cameras. Q 15 minute rounds continue. 

## 2015-06-18 NOTE — ED Notes (Signed)
Pt laying in bed.  

## 2015-06-18 NOTE — ED Notes (Signed)
BEHAVIORAL HEALTH ROUNDING Patient sleeping: No. Patient alert and oriented: yes Behavior appropriate: Yes.  ; If no, describe:  Nutrition and fluids offered: Yes  Toileting and hygiene offered: Yes  Sitter present: no Law enforcement present: Yes  

## 2015-06-18 NOTE — ED Notes (Signed)
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
## 2015-06-18 NOTE — ED Notes (Signed)

## 2015-06-18 NOTE — ED Notes (Signed)
Pt. Noted in room. No complaints or concerns voiced. No distress or abnormal behavior noted. Will continue to monitor with security cameras. Q 15 minute rounds continue. Sandwich and soft drink given. 

## 2015-06-18 NOTE — ED Notes (Signed)
Pt. Noted sleeping in room. No complaints or concerns voiced. No distress or abnormal behavior noted. Will continue to monitor with security cameras. Q 15 minute rounds continue. 

## 2015-06-18 NOTE — ED Notes (Signed)
Pt sleeping. Given breakfast. No known issues at this time

## 2015-06-18 NOTE — ED Notes (Signed)
BEHAVIORAL HEALTH ROUNDING Patient sleeping: Yes.   Patient alert and oriented: not applicable Behavior appropriate: Yes.  ; If no, describe:  Nutrition and fluids offered: Yes  Toileting and hygiene offered: Yes  Sitter present: no Law enforcement present: Yes  

## 2015-06-18 NOTE — BHH Counselor (Signed)
Confirmed with [facility] Staff([phone]), information was received and currently pending review.

## 2015-06-18 NOTE — ED Notes (Signed)
Report received from [facility]. Pt. Alert and oriented in no distress denies SI, HI, AVH and pain.  Pt. Instructed to come to me with problems or concerns.Will continue to monitor for safety via security cameras and Q 15 minute checks.

## 2015-06-18 NOTE — ED Notes (Signed)
I called pharmacy on file. In last 30 days, Darrell [name] has filled; Zolpidem, Finasteride, Sertraline, and Esomeprazole.

## 2015-06-18 NOTE — ED Notes (Signed)
BEHAVIORAL HEALTH ROUNDING Patient sleeping: Yes.   Patient alert and oriented: no Behavior appropriate: Yes.  ; If no, describe:  Nutrition and fluids offered: No Toileting and hygiene offered: Yes  Sitter present: no Law enforcement present: Yes  

## 2015-06-18 NOTE — ED Notes (Signed)
BEHAVIORAL HEALTH ROUNDING Patient sleeping: Yes.   Patient alert and oriented: not applicable Behavior appropriate: Yes.  ; If no, describe:  Nutrition and fluids offered: No Toileting and hygiene offered: No Sitter present: no Law enforcement present: Yes  

## 2015-06-18 NOTE — BHH Counselor (Signed)
Confirmed with [facility] staff Darrell [name]) that patient's referral had been received and is under review.

## 2015-06-18 NOTE — ED Notes (Signed)
No change in condition will continue to monitor  

## 2015-06-19 DIAGNOSIS — F1010 Alcohol abuse, uncomplicated: Secondary | ICD-10-CM | POA: Diagnosis not present

## 2015-06-19 NOTE — BHH Counselor (Signed)
Confirmed with [facility] Staff([phone]), information was received and she reported they didn't know when she will have a bed available.

## 2015-06-19 NOTE — ED Notes (Signed)
BEHAVIORAL HEALTH ROUNDING Patient sleeping: No. Patient alert and oriented: yes Behavior appropriate: Yes.  ; If no, describe:  Nutrition and fluids offered: Yes  Toileting and hygiene offered: Yes  Sitter present: No Law enforcement present: Yes

## 2015-06-19 NOTE — ED Notes (Signed)
Pt. Noted sleeping in room. No complaints or concerns voiced. No distress or abnormal behavior noted. Will continue to monitor with security cameras. Q 15 minute rounds continue. 

## 2015-06-19 NOTE — ED Notes (Addendum)
BEHAVIORAL HEALTH ROUNDING Patient sleeping: Yes.   Patient alert and oriented: yes Behavior appropriate: Yes.  ; If no, describe:  Nutrition and fluids offered: No Toileting and hygiene offered: Yes  Sitter present: No Law enforcement present: Yes

## 2015-06-19 NOTE — ED Notes (Signed)

## 2015-06-19 NOTE — ED Notes (Signed)
BEHAVIORAL HEALTH ROUNDING Patient sleeping: No. Patient alert and oriented: yes Behavior appropriate: Yes.  ; If no, describe:  Nutrition and fluids offered: Yes  Toileting and hygiene offered: Yes  Sitter present: no Law enforcement present: Yes  

## 2015-06-19 NOTE — ED Notes (Signed)

## 2015-06-19 NOTE — ED Notes (Signed)
Patient assigned to appropriate care area. Patient oriented to unit/care area: Informed that, for their safety, care areas are designed for safety and monitored by security cameras at all times; and visiting hours explained to patient. Patient verbalizes understanding, and verbal contract for safety obtained. 

## 2015-06-19 NOTE — ED Notes (Signed)
ED BHU [place] Is the patient under IVC or is there intent for IVC: Yes.   Is the patient medically cleared: Yes.   Is there vacancy in the ED BHU: Yes.   Is the population mix appropriate for patient: Yes.   Is the patient awaiting placement in inpatient or outpatient setting: Yes.   Has the patient had a psychiatric consult: Yes.   Survey of unit performed for contraband, proper placement and condition of furniture, tampering with fixtures in bathroom, shower, and each patient room: Yes.  ; Findings:  APPEARANCE/BEHAVIOR calm and cooperative NEURO ASSESSMENT Orientation: time, place and person Hallucinations: No.None noted (Hallucinations) Speech: Normal Gait: normal RESPIRATORY ASSESSMENT Normal expansion.  Clear to auscultation.  No rales, rhonchi, or wheezing. CARDIOVASCULAR ASSESSMENT regular rate and rhythm, S1, S2 normal, no murmur, click, rub or gallop GASTROINTESTINAL ASSESSMENT soft, nontender, BS WNL, no r/g EXTREMITIES normal strength, tone, and muscle mass PLAN OF CARE Provide calm/safe environment. Vital signs assessed twice daily. ED BHU Assessment once each 12-hour shift. Collaborate with intake RN daily or as condition indicates. Assure the ED provider has rounded once each shift. Provide and encourage hygiene. Provide redirection as needed. Assess for escalating behavior; address immediately and inform ED provider.  Assess family dynamic and appropriateness for visitation as needed: No.; If necessary, describe findings: No interaction with family noted.} Educate the patient/family about BHU procedures/visitation: Yes.  ; If necessary, describe findings:

## 2015-06-19 NOTE — ED Notes (Signed)
BEHAVIORAL HEALTH ROUNDING Patient sleeping: Yes.   Patient alert and oriented: not applicable Behavior appropriate: Yes.  ; If no, describe:  Nutrition and fluids offered: No Toileting and hygiene offered: No Sitter present: no Law enforcement present: Yes  

## 2015-06-19 NOTE — ED Notes (Signed)
BEHAVIORAL HEALTH ROUNDING Patient sleeping: Yes.   Patient alert and oriented: yes Behavior appropriate: Yes.  ; If no, describe:  Nutrition and fluids offered: No Toileting and hygiene offered: No Sitter present: no Law enforcement present: Yes  

## 2015-06-19 NOTE — Consult Note (Signed)
  Psychiatry: Follow-up for this 54 year old man with alcohol dependence and multiple medical problems including hepatitis C and coronary artery disease. No new complaints today. Continues to feel depressed and hopeless. He rarely leaves his room. Continues to state that he fears that he will kill himself if he does not get some treatment that will help him to maintain sobriety.  Vital signs stable. No sign of delirium. Does not appear to be having complicated withdrawal.  Reviewed plan with patient. He has been referred to the alcohol and drug abuse treatment center. We have not had a definite answer yet. If they will take him we will discharge him there as soon as possible but if not I'm afraid we will have to discuss other discharge options. No changed any medication here. No change to diagnosis

## 2015-06-19 NOTE — ED Notes (Signed)
Pt. Noted in room. No complaints or concerns voiced. No distress or abnormal behavior noted. Will continue to monitor with security cameras. Q 15 minute rounds continue. 

## 2015-06-20 MED ORDER — ATORVASTATIN CALCIUM 20 MG PO TABS
ORAL_TABLET | ORAL | Status: AC
Start: 2015-06-20 — End: 2015-06-20
  Administered 2015-06-20: 40 mg via ORAL
  Filled 2015-06-20: qty 2

## 2015-06-20 NOTE — ED Notes (Signed)
BEHAVIORAL HEALTH ROUNDING Patient sleeping: No. Patient alert and oriented: yes Behavior appropriate: Yes.  ; If no, describe:  Nutrition and fluids offered: Yes  Toileting and hygiene offered: Yes  Sitter present: no Law enforcement present: Yes  

## 2015-06-20 NOTE — ED Provider Notes (Signed)
-----------------------------------------   6:52 AM on 06/20/2015 -----------------------------------------   BP 116/75 mmHg  Pulse 76  Temp(Src) 98.5 F (36.9 C) (Oral)  Resp 20  Ht 5\' 11"  (1.803 m)  Wt 190 lb (86.183 kg)  BMI 26.51 kg/m2  SpO2 98%  The patient had no acute events since last update.  Calm and cooperative at this time.  Disposition is pending per Psychiatry/Behavioral Medicine team recommendations.     Paulette Blanch, MD 06/20/15 [phone]

## 2015-06-20 NOTE — ED Notes (Signed)
Pt refused to shower. Pt lying in bed.

## 2015-06-20 NOTE — ED Notes (Signed)
ED BHU [place] Is the patient under IVC or is there intent for IVC: No. Is the patient medically cleared: Yes.   Is there vacancy in the ED BHU: Yes.   Is the population mix appropriate for patient: Yes.   Is the patient awaiting placement in inpatient or outpatient setting: Yes.   Has the patient had a psychiatric consult: Yes.   Survey of unit performed for contraband, proper placement and condition of furniture, tampering with fixtures in bathroom, shower, and each patient room: Yes.  ; Findings:  APPEARANCE/BEHAVIOR cooperative NEURO ASSESSMENT Orientation: AAO x3 Hallucinations: No.None noted (Hallucinations) Speech: Normal Gait: normal RESPIRATORY ASSESSMENT wnl CARDIOVASCULAR ASSESSMENT wnl GASTROINTESTINAL ASSESSMENT wnl EXTREMITIES Moves all extremities  PLAN OF CARE Provide calm/safe environment. Vital signs assessed twice daily. ED BHU Assessment once each 12-hour shift. Collaborate with intake RN daily or as condition indicates. Assure the ED provider has rounded once each shift. Provide and encourage hygiene. Provide redirection as needed. Assess for escalating behavior; address immediately and inform ED provider.  Assess family dynamic and appropriateness for visitation as needed: Yes.  ; If necessary, describe findings:  Educate the patient/family about BHU procedures/visitation: Yes.  ; If necessary, describe findings:

## 2015-06-20 NOTE — ED Notes (Signed)
Pt report received from Henry R RN. Pt care assumed. Pt resting in bed with no needs or concerns at this time.  

## 2015-06-20 NOTE — ED Notes (Signed)
BEHAVIORAL HEALTH ROUNDING Patient sleeping: No. Patient alert and oriented: yes Behavior appropriate: Yes.  ; If no, describe:  Nutrition and fluids offered: Yes  Toileting and hygiene offered: Yes  Sitter present: no Law enforcement present: Yes, ODS 

## 2015-06-20 NOTE — ED Notes (Signed)
BEHAVIORAL HEALTH ROUNDING Patient sleeping: Yes.   Patient alert and oriented: Pt is sleeping.  Behavior appropriate: Pt is sleeping Nutrition and fluids offered: Pt is sleeping.  Toileting and hygiene offered: Pt is sleeping.  Sitter present: yes Law enforcement present: Yes  

## 2015-06-20 NOTE — ED Notes (Signed)
Patient IVC/ Pending Substance abuse program

## 2015-06-20 NOTE — ED Notes (Signed)
BEHAVIORAL HEALTH ROUNDING Patient sleeping: Yes.   Patient alert and oriented: asleep Behavior appropriate: Yes.  ; If no, describe:  Nutrition and fluids offered: Yes  Toileting and hygiene offered: Yes  Sitter present: no Law enforcement present: Yes, ODS

## 2015-06-20 NOTE — ED Notes (Signed)
ED BHU [place] Is the patient under IVC or is there intent for IVC: Yes.   Is the patient medically cleared: Yes.   Is there vacancy in the ED BHU: Yes.   Is the population mix appropriate for patient: Yes.   Is the patient awaiting placement in inpatient or outpatient setting: Yes.   Has the patient had a psychiatric consult: Yes.   Survey of unit performed for contraband, proper placement and condition of furniture, tampering with fixtures in bathroom, shower, and each patient room: Yes.  ; Findings:  APPEARANCE/BEHAVIOR calm, cooperative and adequate rapport can be established NEURO ASSESSMENT Orientation: time, place and person Hallucinations: No.None noted (Hallucinations) Speech: Normal Gait: normal RESPIRATORY ASSESSMENT Normal expansion.  Clear to auscultation.  No rales, rhonchi, or wheezing. CARDIOVASCULAR ASSESSMENT regular rate and rhythm, S1, S2 normal, no murmur, click, rub or gallop GASTROINTESTINAL ASSESSMENT soft, nontender, BS WNL, no r/g EXTREMITIES normal strength, tone, and muscle mass PLAN OF CARE Provide calm/safe environment. Vital signs assessed twice daily. ED BHU Assessment once each 12-hour shift. Collaborate with intake RN daily or as condition indicates. Assure the ED provider has rounded once each shift. Provide and encourage hygiene. Provide redirection as needed. Assess for escalating behavior; address immediately and inform ED provider.  Assess family dynamic and appropriateness for visitation as needed: Yes.  ; If necessary, describe findings:  Educate the patient/family about BHU procedures/visitation: Yes.  ; If necessary, describe findings:

## 2015-06-20 NOTE — ED Notes (Signed)
Pt report received from Genevive Bi. Pt resting in bed with no needs or concerns at this time.

## 2015-06-20 NOTE — ED Notes (Signed)
Pt provided with breakfast tray and drink.  

## 2015-06-20 NOTE — BHH Counselor (Signed)
Confirmed with [facility] Staff(Larry-[phone]), still have the patient information and he reported they didn't know when he will have a bed available. However, he did state, he know their will not be any admission until Monday (06/22/2015).

## 2015-06-20 NOTE — ED Notes (Signed)
BEHAVIORAL HEALTH ROUNDING Patient sleeping: no Patient alert and oriented:yes Behavior appropriate: Yes.  ; If no, describe:  Nutrition and fluids offered: Yes  Toileting and hygiene offered: Yes  Sitter present: no Law enforcement present: Yes, ODS 

## 2015-06-20 NOTE — ED Notes (Signed)

## 2015-06-20 NOTE — ED Notes (Signed)
Pt provided with lunch tray and drink.

## 2015-06-20 NOTE — ED Notes (Signed)
Pt. Given dinner tray, eating in room.

## 2015-06-20 NOTE — ED Notes (Signed)
Pt resting in bed watching tv. No unusual behavior observed. Pt has no needs or concerns at this time. Will continue to monitor and f/u as needed.  

## 2015-06-21 NOTE — ED Notes (Signed)
BEHAVIORAL HEALTH ROUNDING Patient sleeping: Yes.   Patient alert and oriented: not applicable SLEEPING Behavior appropriate: Yes.  ; If no, describe: SLEEPING Nutrition and fluids offered: No SLEEPING Toileting and hygiene offered: NoSLEEPING Sitter present: not applicable Law enforcement present: Yes ODS 

## 2015-06-21 NOTE — ED Notes (Signed)
BEHAVIORAL HEALTH ROUNDING Patient sleeping: Yes.   Patient alert and oriented: not applicable Behavior appropriate: Yes.  ; If no, describe:  Nutrition and fluids offered: Yes  Toileting and hygiene offered: No Sitter present: not applicable Law enforcement present: Yes

## 2015-06-21 NOTE — ED Notes (Signed)
Pt taking shower.  

## 2015-06-21 NOTE — ED Notes (Addendum)
BEHAVIORAL HEALTH ROUNDING Patient sleeping: Yes.   Patient alert and oriented: not applicable SLEEPING Behavior appropriate: Yes.  ; If no, describe: SLEEPING Nutrition and fluids offered: No SLEEPING Toileting and hygiene offered: NoSLEEPING Sitter present: not applicable Law enforcement present: Yes ODS  ENVIRONMENTAL ASSESSMENT  Potentially harmful objects out of patient reach: Yes.  Personal belongings secured: Yes.  Patient dressed in hospital provided attire only: Yes.  Plastic bags out of patient reach: Yes.  Patient care equipment (cords, cables, call bells, lines, and drains) shortened, removed, or accounted for: Yes.  Equipment and supplies removed from bottom of stretcher: Yes.  Potentially toxic materials out of patient reach: Yes.  Sharps container removed or out of patient reach: Yes.

## 2015-06-21 NOTE — ED Notes (Signed)
Pt returned all items to tech and returned to room

## 2015-06-21 NOTE — ED Notes (Signed)
BEHAVIORAL HEALTH ROUNDING  Patient sleeping: No.  Patient alert and oriented: yes  Behavior appropriate: Yes. ; If no, describe:  Nutrition and fluids offered: Yes  Toileting and hygiene offered: Yes  Sitter present: not applicable  Law enforcement present: Yes ODS  ENVIRONMENTAL ASSESSMENT  Potentially harmful objects out of patient reach: Yes.  Personal belongings secured: Yes.  Patient dressed in hospital provided attire only: Yes.  Plastic bags out of patient reach: Yes.  Patient care equipment (cords, cables, call bells, lines, and drains) shortened, removed, or accounted for: Yes.  Equipment and supplies removed from bottom of stretcher: Yes.  Potentially toxic materials out of patient reach: Yes.  Sharps container removed or out of patient reach: Yes.   ED BHU [place]  Is the patient under IVC or is there intent for IVC: Yes.  Is the patient medically cleared: Yes.  Is there vacancy in the ED BHU: Yes.  Is the population mix appropriate for patient: Yes.  Is the patient awaiting placement in inpatient or outpatient setting: Yes.  Has the patient had a psychiatric consult: Yes.  Survey of unit performed for contraband, proper placement and condition of furniture, tampering with fixtures in bathroom, shower, and each patient room: Yes. ; Findings: All clear  APPEARANCE/BEHAVIOR  calm, cooperative and adequate rapport can be established  NEURO ASSESSMENT  Orientation: time, place and person  Hallucinations: No.None noted (Hallucinations)  Speech: Normal  Gait: normal  RESPIRATORY ASSESSMENT  WNL  CARDIOVASCULAR ASSESSMENT  WNL  GASTROINTESTINAL ASSESSMENT  WNL  EXTREMITIES  WNL  PLAN OF CARE  Provide calm/safe environment. Vital signs assessed twice daily. ED BHU Assessment once each 12-hour shift. Collaborate with intake RN daily or as condition indicates. Assure the ED provider has rounded once each shift. Provide and encourage hygiene. Provide  redirection as needed. Assess for escalating behavior; address immediately and inform ED provider.  Assess family dynamic and appropriateness for visitation as needed: Yes. ; If necessary, describe findings:  Educate the patient/family about BHU procedures/visitation: Yes. ; If necessary, describe findings: Pt is calm and cooperative at this time. Pt understanding and accepting of unit procedures/rules. Will continue to monitor.

## 2015-06-21 NOTE — ED Notes (Signed)
Pt sleeping. Given lunch tray 

## 2015-06-21 NOTE — ED Notes (Signed)
Pt provided breakfast and vitals taken, but pt went back to sleep.

## 2015-06-21 NOTE — ED Notes (Signed)
Pt given snack and drink. Pt denies co's at this time. Calm and cooperative.

## 2015-06-21 NOTE — ED Provider Notes (Signed)
-----------------------------------------   7:54 AM on 06/21/2015 -----------------------------------------   BP 123/84 mmHg  Pulse 62  Temp(Src) 98.6 F (37 C) (Oral)  Resp 16  Ht 5\' 11"  (1.803 m)  Wt 190 lb (86.183 kg)  BMI 26.51 kg/m2  SpO2 99%  The patient had no acute events since last update.  Calm and cooperative at this time.  Disposition is pending per Psychiatry/Behavioral Medicine team recommendations.     Orbie Pyo, MD 06/21/15 [phone]

## 2015-06-21 NOTE — ED Notes (Signed)
BEHAVIORAL HEALTH ROUNDING Patient sleeping: Yes.   Patient alert and oriented: not applicable Behavior appropriate: Yes.  ; If no, describe:  Nutrition and fluids offered: No Toileting and hygiene offered: No Sitter present: not applicable Law enforcement present: Yes  

## 2015-06-21 NOTE — ED Notes (Signed)
Pt given breakfast tray

## 2015-06-21 NOTE — ED Notes (Signed)
BEHAVIORAL HEALTH ROUNDING  Patient sleeping: Yes.  Patient alert and oriented: no  Behavior appropriate: Yes. ; If no, describe:  Nutrition and fluids offered: No  Toileting and hygiene offered: No  Sitter present: no  Law enforcement present: Yes   

## 2015-06-21 NOTE — ED Notes (Signed)
BEHAVIORAL HEALTH ROUNDING Patient sleeping: No. Patient alert and oriented: yes Behavior appropriate: Yes.  ; If no, describe:  Nutrition and fluids offered: Yes  Toileting and hygiene offered: Yes  Sitter present: not applicable Law enforcement present: Yes  

## 2015-06-21 NOTE — ED Provider Notes (Signed)
-----------------------------------------   11:00 PM on 06/21/2015 -----------------------------------------   BP 127/75 mmHg  Pulse 70  Temp(Src) 99.1 F (37.3 C) (Oral)  Resp 20  Ht 5\' 11"  (1.803 m)  Wt 190 lb (86.183 kg)  BMI 26.51 kg/m2  SpO2 97%  The patient had no acute events since last update.  Calm and cooperative at this time.  Still awaiting disposition recommendations from psychiatry/behavioral medicine.   Hinda Kehr, MD 06/21/15 2300

## 2015-06-22 DIAGNOSIS — F1010 Alcohol abuse, uncomplicated: Secondary | ICD-10-CM | POA: Diagnosis not present

## 2015-06-22 MED ORDER — ATORVASTATIN CALCIUM 20 MG PO TABS
ORAL_TABLET | ORAL | Status: AC
  Filled 2015-06-22: qty 2

## 2015-06-22 NOTE — ED Notes (Signed)
Pt returned all items to RN and returned to pt room

## 2015-06-22 NOTE — ED Notes (Signed)
Pt given breakfast tray

## 2015-06-22 NOTE — Consult Note (Signed)
  Psychiatry: Follow-up for this 54 year old man with alcohol abuse. No response yet from the alcohol and drug abuse treatment center. Patient stays almost entirely to himself. No new complaints. If we are not getting a response soon I think we will need to make a decision as to whether he needs to be admitted to the hospital or discharged. No change to treatment today. Vitals stable. Does not appear to be an acute alcohol withdrawal.

## 2015-06-22 NOTE — ED Notes (Signed)
BEHAVIORAL HEALTH ROUNDING Patient sleeping: No. Patient alert and oriented: yes Behavior appropriate: Yes.  ; If no, describe:  Nutrition and fluids offered: Yes  Toileting and hygiene offered: Yes  Sitter present: no Law enforcement present: Yes  

## 2015-06-22 NOTE — ED Notes (Signed)
Pt back to nurses station stating he wants to wait until tomorrow to leave. Intake advised.

## 2015-06-22 NOTE — ED Notes (Signed)
BEHAVIORAL HEALTH ROUNDING Patient sleeping: Yes.   Patient alert and oriented: asleep Behavior appropriate: Yes.  ; If no, describe:  Nutrition and fluids offered: Yes  Toileting and hygiene offered: Yes  Sitter present: no Law enforcement present: Yes, ODS

## 2015-06-22 NOTE — ED Notes (Signed)
BEHAVIORAL HEALTH ROUNDING Patient sleeping: Yes.   Patient alert and oriented: not applicable SLEEPING Behavior appropriate: Yes.  ; If no, describe: SLEEPING Nutrition and fluids offered: No SLEEPING Toileting and hygiene offered: NoSLEEPING Sitter present: not applicable Law enforcement present: Yes ODS 

## 2015-06-22 NOTE — ED Notes (Signed)
ED BHU [place] Is the patient under IVC or is there intent for IVC: Yes.   Is the patient medically cleared: Yes.   Is there vacancy in the ED BHU: Yes.   Is the population mix appropriate for patient: Yes.   Is the patient awaiting placement in inpatient or outpatient setting: Yes.   Has the patient had a psychiatric consult: Yes.   Survey of unit performed for contraband, proper placement and condition of furniture, tampering with fixtures in bathroom, shower, and each patient room: Yes.  ; Findings:  APPEARANCE/BEHAVIOR calm and cooperative NEURO ASSESSMENT Orientation: time, place and person Hallucinations: Yes.  None noted (Hallucinations) Speech: Normal Gait: normal RESPIRATORY ASSESSMENT Normal expansion.  Clear to auscultation.  No rales, rhonchi, or wheezing. CARDIOVASCULAR ASSESSMENT regular rate and rhythm, S1, S2 normal, no murmur, click, rub or gallop GASTROINTESTINAL ASSESSMENT soft, nontender, BS WNL, no r/g EXTREMITIES normal strength, tone, and muscle mass PLAN OF CARE Provide calm/safe environment. Vital signs assessed twice daily. ED BHU Assessment once each 12-hour shift. Collaborate with intake RN daily or as condition indicates. Assure the ED provider has rounded once each shift. Provide and encourage hygiene. Provide redirection as needed. Assess for escalating behavior; address immediately and inform ED provider.  Assess family dynamic and appropriateness for visitation as needed: Yes.  ; If necessary, describe findings:  Educate the patient/family about BHU procedures/visitation: Yes.  ; If necessary, describe findings:

## 2015-06-22 NOTE — ED Notes (Signed)
BEHAVIORAL HEALTH ROUNDING Patient sleeping: No. Patient alert and oriented: yes Behavior appropriate: Yes.  ; If no, describe:  Nutrition and fluids offered: Yes  Toileting and hygiene offered: Yes  Sitter present: no Law enforcement present: Yes, ODS 

## 2015-06-22 NOTE — ED Notes (Signed)
Pt up to bathroom and returned to bed.  

## 2015-06-22 NOTE — ED Notes (Signed)
Pt given choc milk and is now taking a shower.

## 2015-06-22 NOTE — ED Notes (Signed)
BEHAVIORAL HEALTH ROUNDING Patient sleeping: Yes.   Patient alert and oriented: yes Behavior appropriate: Yes.  ; If no, describe:  Nutrition and fluids offered: Yes  Toileting and hygiene offered: Yes  Sitter present: no Law enforcement present: Yes  

## 2015-06-22 NOTE — ED Notes (Signed)
Pt given choc milk.  

## 2015-06-22 NOTE — ED Notes (Signed)
Pt resting in bed with eyes closed. No unusual behavior observed. Pt has no needs or concerns at this time. Will continue to monitor and f/u as needed.  

## 2015-06-22 NOTE — ED Notes (Signed)
BEHAVIORAL HEALTH ROUNDING Patient sleeping: no Patient alert and oriented: yes Behavior appropriate: Yes.  ; If no, describe:  Nutrition and fluids offered: Yes  Toileting and hygiene offered: Yes  Sitter present: no Law enforcement present: Yes  

## 2015-06-22 NOTE — ED Notes (Signed)
BEHAVIORAL HEALTH ROUNDING Patient sleeping: no Patient alert and oriented:yes Behavior appropriate: Yes.  ; If no, describe:  Nutrition and fluids offered: Yes  Toileting and hygiene offered: Yes  Sitter present: no Law enforcement present: Yes, ODS 

## 2015-06-22 NOTE — ED Notes (Signed)
ED BHU [place] Is the patient under IVC or is there intent for IVC: Yes.   Is the patient medically cleared: Yes.   Is there vacancy in the ED BHU: Yes.   Is the population mix appropriate for patient: Yes.   Is the patient awaiting placement in inpatient or outpatient setting: Yes.   Has the patient had a psychiatric consult: Yes.   Survey of unit performed for contraband, proper placement and condition of furniture, tampering with fixtures in bathroom, shower, and each patient room: Yes.  ; Findings:  APPEARANCE/BEHAVIOR cooperative NEURO ASSESSMENT Orientation: AAO x 3 Hallucinations: No.None noted (Hallucinations) Speech: Normal Gait: normal RESPIRATORY ASSESSMENT wnl CARDIOVASCULAR ASSESSMENT wnl GASTROINTESTINAL ASSESSMENT wnl EXTREMITIES Moves all extremities  PLAN OF CARE Provide calm/safe environment. Vital signs assessed twice daily. ED BHU Assessment once each 12-hour shift. Collaborate with intake RN daily or as condition indicates. Assure the ED provider has rounded once each shift. Provide and encourage hygiene. Provide redirection as needed. Assess for escalating behavior; address immediately and inform ED provider.  Assess family dynamic and appropriateness for visitation as needed: Yes.  ; If necessary, describe findings: Educate the patient/family about BHU procedures/visitation: Yes.  ; If necessary, describe findings:

## 2015-06-22 NOTE — ED Notes (Signed)
Pt report received from [facility]. Pt care assumed. Pt resting in bed watching tv at this time.

## 2015-06-22 NOTE — ED Notes (Signed)
Pt provided with sandwich tray and drink. Pt brought back sandwich tray demanding 2 cereals and 2 chocolate milks. Previous RN reported pt had been given multiple milks and chocolate milks during the day. Pt was advised he was only going to be provided with sandwich tray and sprite (that he was already given). Pt walked away cursing. Moments later pt back to nurses station demanding milk. Pt advised he can have ice water only until breakfast. Pt angry states he wants to leave now.

## 2015-06-22 NOTE — ED Notes (Signed)

## 2015-06-23 DIAGNOSIS — F1010 Alcohol abuse, uncomplicated: Secondary | ICD-10-CM | POA: Diagnosis not present

## 2015-06-23 NOTE — ED Notes (Signed)
MD has seen pt and will discharge him, as requested by pt.

## 2015-06-23 NOTE — ED Notes (Signed)

## 2015-06-23 NOTE — ED Notes (Signed)

## 2015-06-23 NOTE — ED Notes (Signed)
BEHAVIORAL HEALTH ROUNDING Patient sleeping: Yes.   Patient alert and oriented: asleep Behavior appropriate: Yes.  ; If no, describe:  Nutrition and fluids offered: asleep Toileting and hygiene offered: asleep Sitter present: no Law enforcement present: Yes, ODS 

## 2015-06-23 NOTE — ED Notes (Signed)
BEHAVIORAL HEALTH ROUNDING Patient sleeping: No. Patient alert and oriented: yes Behavior appropriate: Yes.  ; If no, describe:  Nutrition and fluids offered: Yes  Toileting and hygiene offered: Yes  Sitter present: no Law enforcement present: Yes  

## 2015-06-23 NOTE — ED Notes (Signed)
BEHAVIORAL HEALTH ROUNDING Patient sleeping: Yes.   Patient alert and oriented: not applicable Behavior appropriate: Yes.  ; If no, describe:  Nutrition and fluids offered: No Toileting and hygiene offered: No Sitter present: not applicable Law enforcement present: Yes  

## 2015-06-23 NOTE — ED Notes (Signed)
RN went to check on pt and pt stated that he was trying to get some sleep and did not want people coming in and out of his room.  RN agreed to allow pt to sleep undisturbed for awhile.

## 2015-06-23 NOTE — ED Notes (Signed)
BEHAVIORAL HEALTH ROUNDING Patient sleeping: No. Patient alert and oriented: yes Behavior appropriate: Yes.  ; If no, describe:  Nutrition and fluids offered: Yes  Toileting and hygiene offered: Yes  Sitter present: not applicable Law enforcement present: Yes  

## 2015-06-23 NOTE — ED Notes (Signed)
Pt requested chocolate milk.  Pt expressed irritation that he was not allowed to have chocolate milk last night with his snack.  Explanation to pt was that policies are somewhat different at night than during the day.

## 2015-06-23 NOTE — ED Notes (Signed)
Pt resting in bed with eyes closed. No unusual behavior observed. Pt has no needs or concerns at this time. Will continue to monitor and f/u as needed.  

## 2015-06-23 NOTE — Discharge Instructions (Signed)
Alcohol Intoxication  Alcohol intoxication occurs when the amount of alcohol that a person has consumed impairs his or her ability to mentally and physically function. Alcohol directly impairs the normal chemical activity of the brain. Drinking large amounts of alcohol can lead to changes in mental function and behavior, and it can cause many physical effects that can be harmful.   Alcohol intoxication can range in severity from mild to very severe. Various factors can affect the level of intoxication that occurs, such as the person's age, gender, weight, frequency of alcohol consumption, and the presence of other medical conditions (such as diabetes, seizures, or heart conditions). Dangerous levels of alcohol intoxication may occur when people drink large amounts of alcohol in a short period (binge drinking). Alcohol can also be especially dangerous when combined with certain prescription medicines or "recreational" drugs.  SIGNS AND SYMPTOMS  Some common signs and symptoms of mild alcohol intoxication include:  · Loss of coordination.  · Changes in mood and behavior.  · Impaired judgment.  · Slurred speech.  As alcohol intoxication progresses to more severe levels, other signs and symptoms will appear. These may include:  · Vomiting.  · Confusion and impaired memory.  · Slowed breathing.  · Seizures.  · Loss of consciousness.  DIAGNOSIS   Your health care provider will take a medical history and perform a physical exam. You will be asked about the amount and type of alcohol you have consumed. Blood tests will be done to measure the concentration of alcohol in your blood. In many places, your blood alcohol level must be lower than 80 mg/dL (0.08%) to legally drive. However, many dangerous effects of alcohol can occur at much lower levels.   TREATMENT   People with alcohol intoxication often do not require treatment. Most of the effects of alcohol intoxication are temporary, and they go away as the alcohol naturally  leaves the body. Your health care provider will monitor your condition until you are stable enough to go home. Fluids are sometimes given through an IV access tube to help prevent dehydration.   HOME CARE INSTRUCTIONS  · Do not drive after drinking alcohol.  · Stay hydrated. Drink enough water and fluids to keep your urine clear or pale yellow. Avoid caffeine.    · Only take over-the-counter or prescription medicines as directed by your health care provider.    SEEK MEDICAL CARE IF:   · You have persistent vomiting.    · You do not feel better after a few days.  · You have frequent alcohol intoxication. Your health care provider can help determine if you should see a substance use treatment counselor.  SEEK IMMEDIATE MEDICAL CARE IF:   · You become shaky or tremble when you try to stop drinking.    · You shake uncontrollably (seizure).    · You throw up (vomit) blood. This may be bright red or may look like black coffee grounds.    · You have blood in your stool. This may be bright red or may appear as a black, tarry, bad smelling stool.    · You become lightheaded or faint.    MAKE SURE YOU:   · Understand these instructions.  · Will watch your condition.  · Will get help right away if you are not doing well or get worse.  Document Released: 09/21/2005 Document Revised: 08/14/2013 Document Reviewed: 05/17/2013  Exit[employer]® Patient Information ©2015 [employer]. This information is not intended to replace advice given to you by your health care provider. Make sure   you discuss any questions you have with your health care provider.

## 2015-06-23 NOTE — Consult Note (Signed)
  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 Patient interviewed. Chart reviewed. Today patient states he is feeling better. No longer reports any suicidal ideation. Mood is more relaxed. He is requesting discharge.  On review of systems he is feeling better. Pain is decreased. Appetite improved. Dizziness improved. Denies suicidal thoughts.  On mental status he is more interactive. Euthymic affect. Expresses more hopefulness. No suicidal thoughts expressed.  We have made multiple attempts to contact the alcohol and drug abuse treatment center about referral for this patient and they have not given [place] any feedback as to whether they would admit him or not. They appear to be giving an answer either yes or no. At this point it is unclear when or whether he will ever be admitted there. No further benefit the hospital level treatment.  Supportive counseling and educational counseling completed. Reviewed medications. Patient is to find his own place to stay in [place] and find a way to follow-up with outpatient substance abuse treatment there. No change to diagnosis. Commitment discontinued

## 2015-06-23 NOTE — ED Notes (Signed)
Pt is co-operative but requesting to be admitted .  Doesn't know why he is still here waiting for placement

## 2015-06-23 NOTE — ED Notes (Signed)
Pt has expressed the desire to leave and he wants to leave before the buses to [place] stop running.  RN paged MD who will confirm pt's request and discharge him.

## 2015-06-23 NOTE — ED Provider Notes (Signed)
-----------------------------------------   1:47 PM on 06/23/2015 -----------------------------------------   BP 114/78 mmHg  Pulse 72  Temp(Src) 98 F (36.7 C) (Oral)  Resp 18  Ht 5\' 11"  (1.803 m)  Wt 190 lb (86.183 kg)  BMI 26.51 kg/m2  SpO2 100%  The patient had no acute events since last update.  Calm and cooperative at this time.  Previous notes reviewed. Patient remains medically stable without any acute complaints and normal vital signs. I discussed his care with psychiatry Dr. Weber Cooks units the patient is awake alert and lucid, and not suicidal. No homicidal ideation or hallucinations. He is psychiatrically stable and plans to follow up with outpatient psychiatry in [place] where he will pursue an alcohol and substance abuse rehabilitation program. The patient is in good condition, no distress, stable for discharge   Carrie Mew, MD 06/23/15 1348

## 2015-06-23 NOTE — ED Notes (Signed)
BEHAVIORAL HEALTH ROUNDING Patient sleeping: Yes.   Patient alert and oriented: yes Behavior appropriate: Yes.  ; If no, describe:  Nutrition and fluids offered: Yes  Toileting and hygiene offered: Yes  Sitter present: no Law enforcement present: Yes  

## 2015-06-23 NOTE — ED Notes (Signed)
Pt came to nurses' station to ask to use the phone.  Pt was told he could use the phone between 1 and 3.  He asked the time and he was told 12:25

## 2015-07-11 ENCOUNTER — Emergency Department (HOSPITAL_COMMUNITY)
Admission: EM | Admit: 2015-07-11 | Discharge: 2015-07-11 | Disposition: A | Discharge status: Home / Self Care | Payer: BLUE CROSS/BLUE SHIELD | Attending: Emergency Medicine | Admitting: Emergency Medicine

## 2015-07-11 ENCOUNTER — Emergency Department (HOSPITAL_COMMUNITY): Payer: BLUE CROSS/BLUE SHIELD

## 2015-07-11 ENCOUNTER — Encounter (HOSPITAL_COMMUNITY): Payer: Self-pay | Admitting: *Deleted

## 2015-07-11 DIAGNOSIS — F10129 Alcohol abuse with intoxication, unspecified: Secondary | ICD-10-CM | POA: Insufficient documentation

## 2015-07-11 DIAGNOSIS — R079 Chest pain, unspecified: Secondary | ICD-10-CM

## 2015-07-11 DIAGNOSIS — Z7982 Long term (current) use of aspirin: Secondary | ICD-10-CM | POA: Insufficient documentation

## 2015-07-11 DIAGNOSIS — Z8611 Personal history of tuberculosis: Secondary | ICD-10-CM | POA: Insufficient documentation

## 2015-07-11 DIAGNOSIS — Z792 Long term (current) use of antibiotics: Secondary | ICD-10-CM | POA: Insufficient documentation

## 2015-07-11 DIAGNOSIS — Z9861 Coronary angioplasty status: Secondary | ICD-10-CM | POA: Insufficient documentation

## 2015-07-11 DIAGNOSIS — Z79899 Other long term (current) drug therapy: Secondary | ICD-10-CM | POA: Insufficient documentation

## 2015-07-11 DIAGNOSIS — Z8619 Personal history of other infectious and parasitic diseases: Secondary | ICD-10-CM | POA: Insufficient documentation

## 2015-07-11 DIAGNOSIS — G8929 Other chronic pain: Secondary | ICD-10-CM | POA: Insufficient documentation

## 2015-07-11 DIAGNOSIS — I252 Old myocardial infarction: Secondary | ICD-10-CM | POA: Insufficient documentation

## 2015-07-11 DIAGNOSIS — F1010 Alcohol abuse, uncomplicated: Secondary | ICD-10-CM

## 2015-07-11 DIAGNOSIS — Z720 Tobacco use: Secondary | ICD-10-CM | POA: Insufficient documentation

## 2015-07-11 DIAGNOSIS — Z7902 Long term (current) use of antithrombotics/antiplatelets: Secondary | ICD-10-CM | POA: Insufficient documentation

## 2015-07-11 DIAGNOSIS — F419 Anxiety disorder, unspecified: Secondary | ICD-10-CM | POA: Insufficient documentation

## 2015-07-11 LAB — BASIC METABOLIC PANEL
Anion gap: 12 (ref 5–15)
BUN: 7 mg/dL (ref 6–20)
CALCIUM: 8.5 mg/dL — AB (ref 8.9–10.3)
CO2: 21 mmol/L — AB (ref 22–32)
CREATININE: 1.07 mg/dL (ref 0.61–1.24)
Chloride: 105 mmol/L (ref 101–111)
GFR calc Af Amer: >60 mL/min (ref >60)
GFR calc non Af Amer: >60 mL/min (ref >60)
Glucose, Bld: 89 mg/dL (ref 65–99)
Potassium: 3.6 mmol/L (ref 3.5–5.1)
Sodium: 138 mmol/L (ref 135–145)

## 2015-07-11 LAB — I-STAT TROPONIN, ED: TROPONIN I, POC: 0 ng/mL (ref 0.00–0.08)

## 2015-07-11 LAB — CBC
HCT: 41.8 % (ref 39.0–52.0)
HEMOGLOBIN: 14.3 g/dL (ref 13.0–17.0)
MCH: 31.2 pg (ref 26.0–34.0)
MCHC: 34.2 g/dL (ref 30.0–36.0)
MCV: 91.3 fL (ref 78.0–100.0)
Platelets: 200 10*3/uL (ref 150–400)
RBC: 4.58 MIL/uL (ref 4.22–5.81)
RDW: 14.3 % (ref 11.5–15.5)
WBC: 4.7 10*3/uL (ref 4.0–10.5)

## 2015-07-11 LAB — ETHANOL: Alcohol, Ethyl (B): 181 mg/dL — ABNORMAL HIGH (ref <5)

## 2015-07-11 NOTE — ED Notes (Signed)
Pt given second sandwich 

## 2015-07-11 NOTE — ED Notes (Signed)
Pt able to ambulate w/o assistance.

## 2015-07-11 NOTE — ED Notes (Addendum)
Pt to ED via [facility] c/o chest pain while walking down the road. Pt given 324mg  ASA and nitro x 1 with relief.  Also endorses SI to EMS with plan to hang himself; denies SI at this time ETOH on board

## 2015-07-11 NOTE — ED Notes (Signed)
Staffing aware of need for sitter

## 2015-07-11 NOTE — ED Notes (Addendum)
Reports he had 6 24oz of "4 locos...and a couple of shots of whisky.  Denies other drugs today.  Last drink was 60mins ago.  Gave crackers and sandwich.  Denies SI/HI, hallucinations.  Gave pt sandwich

## 2015-07-11 NOTE — ED Notes (Signed)
Pt refused to allow vitals to be taken prior to discharged.

## 2015-07-11 NOTE — ED Provider Notes (Signed)
CSN: [id number]     Arrival date & time 07/11/15  1027 History   This chart was scribed for Everlene Balls, MD by Chester Holstein, ED Scribe. This patient was seen in room A07C/A07C and the patient's care was started at 3:52 AM.    Chief Complaint  Patient presents with  . Chest Pain  . Alcohol Intoxication  . Suicidal     The history is provided by the patient. No language interpreter was used.   HPI Comments: Darrell [name] is a 54 y.o. male brought in by ambulance, with PMHx of hepatitis C, MI, heroin abuse, and EtOH abuse who presents to the Emergency Department complaining of alcohol intoxication. He notes associated chest pain. Pt reports he drank 6 Four Lokos this evening. Pt denies SOB and any other complaints currently.   Past Medical History  Diagnosis Date  . Hepatitis C   . Tuberculosis   . Myocardial infarction   . Anxiety   . Chronic pain   . Alcohol abuse   . History of heroin abuse    Past Surgical History  Procedure Laterality Date  . Coronary stent placement     Family History  Problem Relation Age of Onset  . Heart attack      paternal uncle died MI 50   . Cancer      lung-maternal GM  . Cystic fibrosis      mom. died with pna  . Alcohol abuse      brother   History  Substance Use Topics  . Smoking status: Current Every Day Smoker -- 2.00 packs/day    Types: Cigarettes  . Smokeless tobacco: Never Used     Comment: trying to quit  . Alcohol Use: Yes     Comment: daily  alcohol   ale ,liquor     Review of Systems  Cardiovascular: Positive for chest pain.  A complete 10 system review of systems was obtained and all systems are negative except as noted in the HPI and PMH.     Allergies  Review of patient's allergies indicates no known allergies.  Home Medications   Prior to Admission medications   Medication Sig Start Date End Date Taking? Authorizing Provider  amLODipine (NORVASC) 5 MG tablet Take 1 tablet (5 mg total) by mouth  daily. Patient not taking: Reported on 05/24/2015 05/08/15   Riccardo Dubin, MD  aspirin EC 81 MG EC tablet Take 1 tablet (81 mg total) by mouth daily. Patient not taking: Reported on 05/24/2015 05/08/15   Riccardo Dubin, MD  atorvastatin (LIPITOR) 40 MG tablet Take 1 tablet (40 mg total) by mouth daily at 6 PM. Patient not taking: Reported on 05/24/2015 05/08/15   Riccardo Dubin, MD  cephALEXin (KEFLEX) 500 MG capsule Take 1 capsule (500 mg total) by mouth 3 (three) times daily. Patient not taking: Reported on 05/24/2015 05/13/15   Rolland Porter, MD  clopidogrel (PLAVIX) 75 MG tablet Take 75 mg by mouth daily. Patient states that is currently not taking any of his medications.    Historical Provider, MD  esomeprazole (NEXIUM) 40 MG capsule Take 40 mg by mouth daily at 12 noon.    Historical Provider, MD  finasteride (PROSCAR) 5 MG tablet Take 5 mg by mouth daily.    Historical Provider, MD  nitroGLYCERIN (NITROSTAT) 0.4 MG SL tablet Place 1 tablet (0.4 mg total) under the tongue every 5 (five) minutes as needed for chest pain. Patient not taking: Reported on 06/12/2015 05/08/15  Riccardo Dubin, MD  polymixin-bacitracin (POLYSPORIN) 500-10000 UNIT/GM OINT ointment Apply 1 application topically 2 (two) times daily. Patient not taking: Reported on 05/24/2015 05/08/15   Riccardo Dubin, MD  sertraline (ZOLOFT) 50 MG tablet Take 50 mg by mouth daily.    Historical Provider, MD  zolpidem (AMBIEN CR) 12.5 MG CR tablet Take 12.5 mg by mouth at bedtime.    Historical Provider, MD   BP 106/71 mmHg  Pulse 92  Temp(Src) 98 F (36.7 C) (Oral)  Resp 17  SpO2 93% Physical Exam  Constitutional: He is oriented to person, place, and time. Vital signs are normal. He appears well-developed and well-nourished.  Non-toxic appearance. He does not appear ill. No distress.  Clinically intoxicated  HENT:  Head: Normocephalic and atraumatic.  Nose: Nose normal.  Mouth/Throat: Oropharynx is clear and moist. No oropharyngeal  exudate.  Eyes: Conjunctivae and EOM are normal. Pupils are equal, round, and reactive to light. No scleral icterus.  Neck: Normal range of motion. Neck supple. No tracheal deviation, no edema, no erythema and normal range of motion present. No thyroid mass and no thyromegaly present.  Cardiovascular: Normal rate, regular rhythm, S1 normal, S2 normal, normal heart sounds, intact distal pulses and normal pulses.  Exam reveals no gallop and no friction rub.   No murmur heard. Pulses:      Radial pulses are 2+ on the right side, and 2+ on the left side.       Dorsalis pedis pulses are 2+ on the right side, and 2+ on the left side.  Pulmonary/Chest: Effort normal and breath sounds normal. No respiratory distress. He has no wheezes. He has no rhonchi. He has no rales.  Abdominal: Soft. Normal appearance and bowel sounds are normal. He exhibits no distension, no ascites and no mass. There is no hepatosplenomegaly. There is no tenderness. There is no rebound, no guarding and no CVA tenderness.  Musculoskeletal: Normal range of motion. He exhibits no edema or tenderness.  Lymphadenopathy:    He has no cervical adenopathy.  Neurological: He is alert and oriented to person, place, and time. He has normal strength. No cranial nerve deficit or sensory deficit.  Skin: Skin is warm, dry and intact. No petechiae and no rash noted. He is not diaphoretic. No erythema. No pallor.  Psychiatric: He has a normal mood and affect. His behavior is normal. Judgment normal.  Nursing note and vitals reviewed.   ED Course  Procedures (including critical care time) DIAGNOSTIC STUDIES: Oxygen Saturation is 93% on room air, adequate by my interpretation.    COORDINATION OF CARE: 3:54 AM Discussed treatment plan with patient at beside, the patient agrees with the plan and has no further questions at this time.   Labs Review Labs Reviewed  BASIC METABOLIC PANEL - Abnormal; Notable for the following:    CO2 21 (*)     Calcium 8.5 (*)    All other components within normal limits  CBC  ETHANOL  I-STAT TROPOININ, ED    Imaging Review Dg Chest 2 View  07/11/2015   CLINICAL DATA:  Chest pain and shortness breath. History myocardial infarction 2014, tuberculosis.  EXAM: CHEST  2 VIEW  COMPARISON:  Chest radiograph May 24, 2015  FINDINGS: Cardiomediastinal silhouette is unremarkable. The lungs are clear without pleural effusions or focal consolidations. Trachea projects midline and there is no pneumothorax. Soft tissue planes and included osseous structures are non-suspicious.  IMPRESSION: No acute cardiopulmonary process.   Electronically Signed   By:  Elon Alas M.D.   On: 07/11/2015 04:15     EKG Interpretation   Date/Time:  Saturday July 11 2015 03:45:16 EDT Ventricular Rate:  85 PR Interval:  126 QRS Duration: 98 QT Interval:  407 QTC Calculation: 484 R Axis:   31 Text Interpretation:  Sinus rhythm Probable left atrial enlargement  Borderline prolonged QT interval No significant change since last tracing  Confirmed by Glynn Octave [id number]) on 07/11/2015 4:06:45 AM      MDM   Final diagnoses:  None   Patient presents to the emergency department for alcohol intoxication. He states he drank 6 24ox 4 locos today and that he drinks every day. He reportedly called EMS for chest pain. He was given aspirin and nitroglycerin with relief. He currently is chest pain-free. Initial troponin and EKG are negative. Patient's history is not consistent with ACS, his pain is likely from his alcohol drinking. Patient was observed overnight in emergency department without any further acute episodes of chest pain. At this point he is low risk for ACS and safe for discharge.  Outpatient resources for his alcohol abuse. His vital signs were within his normal limits and he is safe for discharge.   I personally performed the services described in this documentation, which was scribed in my presence. The  recorded information has been reviewed and is accurate.    Everlene Balls, MD 07/11/15 [phone]

## 2015-07-11 NOTE — Discharge Instructions (Signed)
Alcohol Intoxication Darrell [name], your workup today for your heart is normal. Attached is a list of phone numbers you can call to help you quit drinking. See A primary care physician within 3 days for close follow-up. If symptoms worsen come back to emergency department immediately. Thank you. Alcohol intoxication occurs when you drink enough alcohol that it affects your ability to function. It can be mild or very severe. Drinking a lot of alcohol in a short time is called binge drinking. This can be very harmful. Drinking alcohol can also be more dangerous if you are taking medicines or other drugs. Some of the effects caused by alcohol may include:  Loss of coordination.  Changes in mood and behavior.  Unclear thinking.  Trouble talking (slurred speech).  Throwing up (vomiting).  Confusion.  Slowed breathing.  Twitching and shaking (seizures).  Loss of consciousness. HOME CARE  Do not drive after drinking alcohol.  Drink enough water and fluids to keep your pee (urine) clear or pale yellow. Avoid caffeine.  Only take medicine as told by your doctor. GET HELP IF:  You throw up (vomit) many times.  You do not feel better after a few days.  You frequently have alcohol intoxication. Your doctor can help decide if you should see a substance use treatment counselor. GET HELP RIGHT AWAY IF:  You become shaky when you stop drinking.  You have twitching and shaking.  You throw up blood. It may look bright red or like coffee grounds.  You notice blood in your poop (bowel movements).  You become lightheaded or pass out (faint). MAKE SURE YOU:   Understand these instructions.  Will watch your condition.  Will get help right away if you are not doing well or get worse. Document Released: 05/30/2008 Document Revised: 08/14/2013 Document Reviewed: 05/17/2013 [facility] Patient Information 2015 [place], [place]. This information is not intended to replace advice given to you by  your health care provider. Make sure you discuss any questions you have with your health care provider. Chest Pain (Nonspecific) It is often hard to give a diagnosis for the cause of chest pain. There is always a chance that your pain could be related to something serious, such as a heart attack or a blood clot in the lungs. You need to follow up with your doctor. HOME CARE  If antibiotic medicine was given, take it as directed by your doctor. Finish the medicine even if you start to feel better.  For the next few days, avoid activities that bring on chest pain. Continue physical activities as told by your doctor.  Do not use any tobacco products. This includes cigarettes, chewing tobacco, and e-cigarettes.  Avoid drinking alcohol.  Only take medicine as told by your doctor.  Follow your doctor's suggestions for more testing if your chest pain does not go away.  Keep all doctor visits you made. GET HELP IF:  Your chest pain does not go away, even after treatment.  You have a rash with blisters on your chest.  You have a fever. GET HELP RIGHT AWAY IF:   You have more pain or pain that spreads to your arm, neck, jaw, back, or belly (abdomen).  You have shortness of breath.  You cough more than usual or cough up blood.  You have very bad back or belly pain.  You feel sick to your stomach (nauseous) or throw up (vomit).  You have very bad weakness.  You pass out (faint).  You have chills. This  is an emergency. Do not wait to see if the problems will go away. Call your local emergency services (911 in [place] Do not drive yourself to the hospital. MAKE SURE YOU:   Understand these instructions.  Will watch your condition.  Will get help right away if you are not doing well or get worse. Document Released: 05/30/2008 Document Revised: 12/17/2013 Document Reviewed: 05/30/2008 [facility] Patient Information 2015 [place], [place]. This information is not intended to replace  advice given to you by your health care provider. Make sure you discuss any questions you have with your health care provider. Substance Abuse Treatment Programs  Intensive Outpatient Programs [facility]     [address], [place]       [facility] [address] [place], [place]  [facility]     (Inpatient and outpatient)     [address]           [place] [phone] (Suboxone and Methadone)  [place], [place] [address]      [address] [place], [place]  [facility] [place] (Outpatient/Inpatient, Chemical)    (insurance only) [phone]             [facility] ([place]) [place], [place]     [facility]     [address]     [place], [place]       [facility] (for caregivers and family) [phone] [address] Kristeen Mans. [place], [place]      Residential Treatment Programs [facility]      [address], [place], [place] [address]  [phone]       [place], [place] [address] [phone]  [facility] [place]        [phone]       [facility] [place] [phone]  [facility] ([place]             [place], [place] or [place] of [place] [address] [place], [address] [phone]  [facility]    [address]      [place], [place]       [facility] [address] [place], [place]  [place]   [address] [place], [place]  [address]     [phone]  Admissions: 8am-3pm M-F  [facility] (RTS) [place], [place]  BATS Program: Residential Program ([address]   [place], [place] or [phone]     ADATC: [place], [place] (Walk in Hours over the weekend or by referral)  [facility] [place], [place], [place] [address] [phone]  Crisis Mobile: [facility]:  [phone] (for crisis response 24 hours a day) [facility] Hotline:      ([phone] Outpatient Psychiatry and Counseling  [facility]: Mobile Crisis Management 24 hours:  [phone]  [facility] of the [employer] sliding scale fee and walk in schedule: M-F 8am-12pm/1pm-3pm [place], [place] [address] [address] [place], [place] [address] [phone]  [facility] [employer])- new patient walk-in appointments available Monday - Friday 8am -3pm.          [address] [place], [place] [address] [phone] or crisis line- [place] [facility]/ Intensive Outpatient Therapy Program [place], [place] [address] [place]      [phone] [address] [place], [place] [address]                 [place]   [facility] [phone]. [place], [place] [address]   [employer] of Care          [address] Johnette Abraham  [place], [place] [address]       [phone]  [facility] [address] [place], [place] [address] [phone]  [facility]    [address] [place], [place] [address]     [place],  [place] Joycelyn Man     [place] [address]     [phone]       [facility] [place] [address]  [facility]     [address], [place], [place], [place] [address] [place]     [address]     [place], [place] [address]     [phone]       [facility] [address] [place], [place] [address] [phone] Resources for Temporary Residential Assistance/Crisis [facility] 2) M-F 8am-3pm   [address] [place], [place] [address]   [phone] Services include: laundry, barbering, support groups, case management, phone  & computer access, showers, AA/NA mtgs, mental health/substance abuse nurse, job skills class, disability information, VA assistance, spiritual classes, etc.   [facility]   [address], [place]     [place]              [employer] (women and children)       [place]. [place], [place]  [address] [phone] [email or web address] for application and process Application Required  [facility]   [address]    [place] [address]     [phone]                    [place], [place] [address] [phone]([email or web address] application appt.) Application Required  [facility] (women only)    [address]     [place], [place] [address]     [phone]      Intake starts 6pm daily Need valid ID, SSC, & Police report [employer] [address] [place], [place] [phone] Application Required  [employer] (men only)     [place].      [place],  [place]       [facility] (Pregnant women only) [address] [place], [place]  [facility]      [place] [address].      [place], [place] [address]     [phone]             [facility] [address] [place], [place] 90 day commitment/SA/Application process  [facility](men only)     [address]     [place], [place]       Check-in at [facility] of [facility] [address] [place], [place] [address] [phone] Men/Women/Women and Children must be there by 7 pm  [place], [place]

## 2015-07-12 ENCOUNTER — Emergency Department (HOSPITAL_COMMUNITY)
Admission: EM | Admit: 2015-07-12 | Discharge: 2015-07-13 | Disposition: A | Discharge status: Home / Self Care | Payer: Self-pay | Attending: Emergency Medicine | Admitting: Emergency Medicine

## 2015-07-12 ENCOUNTER — Encounter (HOSPITAL_COMMUNITY): Payer: Self-pay | Admitting: Emergency Medicine

## 2015-07-12 DIAGNOSIS — G8929 Other chronic pain: Secondary | ICD-10-CM | POA: Insufficient documentation

## 2015-07-12 DIAGNOSIS — I252 Old myocardial infarction: Secondary | ICD-10-CM | POA: Insufficient documentation

## 2015-07-12 DIAGNOSIS — F1310 Sedative, hypnotic or anxiolytic abuse, uncomplicated: Secondary | ICD-10-CM | POA: Insufficient documentation

## 2015-07-12 DIAGNOSIS — F1010 Alcohol abuse, uncomplicated: Secondary | ICD-10-CM | POA: Insufficient documentation

## 2015-07-12 DIAGNOSIS — F419 Anxiety disorder, unspecified: Secondary | ICD-10-CM | POA: Insufficient documentation

## 2015-07-12 DIAGNOSIS — Z9889 Other specified postprocedural states: Secondary | ICD-10-CM | POA: Insufficient documentation

## 2015-07-12 DIAGNOSIS — F1020 Alcohol dependence, uncomplicated: Secondary | ICD-10-CM | POA: Diagnosis present

## 2015-07-12 DIAGNOSIS — Z720 Tobacco use: Secondary | ICD-10-CM | POA: Insufficient documentation

## 2015-07-12 DIAGNOSIS — F331 Major depressive disorder, recurrent, moderate: Secondary | ICD-10-CM | POA: Diagnosis present

## 2015-07-12 DIAGNOSIS — R45850 Homicidal ideations: Secondary | ICD-10-CM

## 2015-07-12 DIAGNOSIS — Z8619 Personal history of other infectious and parasitic diseases: Secondary | ICD-10-CM | POA: Insufficient documentation

## 2015-07-12 LAB — COMPREHENSIVE METABOLIC PANEL
ALT: 60 U/L (ref 17–63)
AST: 42 U/L — AB (ref 15–41)
Albumin: 3.8 g/dL (ref 3.5–5.0)
Alkaline Phosphatase: 54 U/L (ref 38–126)
Anion gap: 7 (ref 5–15)
BUN: 8 mg/dL (ref 6–20)
CO2: 25 mmol/L (ref 22–32)
CREATININE: 0.89 mg/dL (ref 0.61–1.24)
Calcium: 8.2 mg/dL — ABNORMAL LOW (ref 8.9–10.3)
Chloride: 113 mmol/L — ABNORMAL HIGH (ref 101–111)
GFR calc non Af Amer: >60 mL/min (ref >60)
GLUCOSE: 97 mg/dL (ref 65–99)
Potassium: 3.8 mmol/L (ref 3.5–5.1)
Sodium: 145 mmol/L (ref 135–145)
TOTAL PROTEIN: 7.4 g/dL (ref 6.5–8.1)
Total Bilirubin: 0.2 mg/dL — ABNORMAL LOW (ref 0.3–1.2)

## 2015-07-12 LAB — RAPID URINE DRUG SCREEN, HOSP PERFORMED
Amphetamines: NOT DETECTED
Barbiturates: NOT DETECTED
Benzodiazepines: POSITIVE — AB
COCAINE: NOT DETECTED
Opiates: NOT DETECTED
Tetrahydrocannabinol: NOT DETECTED

## 2015-07-12 LAB — ACETAMINOPHEN LEVEL: Acetaminophen (Tylenol), Serum: <10 ug/mL — ABNORMAL LOW (ref 10–30)

## 2015-07-12 LAB — CBC
HEMATOCRIT: 44.7 % (ref 39.0–52.0)
Hemoglobin: 14.5 g/dL (ref 13.0–17.0)
MCH: 30.1 pg (ref 26.0–34.0)
MCHC: 32.4 g/dL (ref 30.0–36.0)
MCV: 92.7 fL (ref 78.0–100.0)
Platelets: 200 10*3/uL (ref 150–400)
RBC: 4.82 MIL/uL (ref 4.22–5.81)
RDW: 14.4 % (ref 11.5–15.5)
WBC: 5 10*3/uL (ref 4.0–10.5)

## 2015-07-12 LAB — ETHANOL: Alcohol, Ethyl (B): 254 mg/dL — ABNORMAL HIGH (ref <5)

## 2015-07-12 LAB — SALICYLATE LEVEL: Salicylate Lvl: <4 mg/dL (ref 2.8–30.0)

## 2015-07-12 MED ORDER — LORAZEPAM 0.5 MG PO TABS
1.0000 mg | ORAL_TABLET | Freq: Four times a day (QID) | ORAL | Status: DC | PRN
  Administered 2015-07-12 – 2015-07-13 (×2): 1 mg via ORAL
  Filled 2015-07-12 (×2): qty 2

## 2015-07-12 MED ORDER — METOPROLOL SUCCINATE ER 50 MG PO TB24
50.0000 mg | ORAL_TABLET | Freq: Every day | ORAL | Status: DC
  Administered 2015-07-12 – 2015-07-13 (×2): 50 mg via ORAL
  Filled 2015-07-12 (×2): qty 1

## 2015-07-12 MED ORDER — ONDANSETRON HCL 4 MG PO TABS
4.0000 mg | ORAL_TABLET | Freq: Three times a day (TID) | ORAL | Status: DC | PRN
Start: 2015-07-12 — End: 2015-07-13

## 2015-07-12 MED ORDER — CLOPIDOGREL BISULFATE 75 MG PO TABS
75.0000 mg | ORAL_TABLET | Freq: Every day | ORAL | Status: DC
  Administered 2015-07-12 – 2015-07-13 (×2): 75 mg via ORAL
  Filled 2015-07-12 (×2): qty 1

## 2015-07-12 MED ORDER — ALUM & MAG HYDROXIDE-SIMETH 200-200-20 MG/5ML PO SUSP
30.0000 mL | ORAL | Status: DC | PRN
Start: 2015-07-12 — End: 2015-07-13

## 2015-07-12 MED ORDER — TRAZODONE HCL 100 MG PO TABS: 100.0000 mg | ORAL_TABLET | Freq: Every evening | ORAL | Status: DC | PRN

## 2015-07-12 MED ORDER — IBUPROFEN 200 MG PO TABS: 600.0000 mg | ORAL_TABLET | Freq: Three times a day (TID) | ORAL | Status: DC | PRN

## 2015-07-12 MED ORDER — ASPIRIN EC 81 MG PO TBEC
81.0000 mg | DELAYED_RELEASE_TABLET | Freq: Every day | ORAL | Status: DC
  Administered 2015-07-12 – 2015-07-13 (×2): 81 mg via ORAL
  Filled 2015-07-12 (×2): qty 1

## 2015-07-12 MED ORDER — FLUOXETINE HCL 10 MG PO CAPS
10.0000 mg | ORAL_CAPSULE | Freq: Every day | ORAL | Status: DC
  Administered 2015-07-12: 10 mg via ORAL
  Filled 2015-07-12: qty 1

## 2015-07-12 MED ORDER — ATORVASTATIN CALCIUM 80 MG PO TABS
80.0000 mg | ORAL_TABLET | Freq: Every day | ORAL | Status: DC
  Administered 2015-07-12: 80 mg via ORAL
  Filled 2015-07-12 (×2): qty 1

## 2015-07-12 MED ORDER — HYDROXYZINE HCL 25 MG PO TABS: 25.0000 mg | ORAL_TABLET | Freq: Three times a day (TID) | ORAL | Status: DC | PRN

## 2015-07-12 NOTE — ED Notes (Signed)
Bed: PJ82 Expected date:  Expected time:  Means of arrival:  Comments: Tr4

## 2015-07-12 NOTE — ED Notes (Signed)
Pt arrived to the ED with a complaint of homicidal intent.  Pt brought in by police.  Pt states that he has ETOH on board.  Pt wants help with alcohol.

## 2015-07-12 NOTE — BH Assessment (Signed)
Unable to complete assessment at this time. Pt is unresponsive to questions. Pt will yawn and mumble incoherently. Alvina Chou, PA-C has been informed that TTS has not be completed and will be completed when pt is alert and coherent.

## 2015-07-12 NOTE — ED Notes (Addendum)
Patient belongings include:  Veleta Miners and white striped t-shirt Pair of blue jeans      Pocket contains $1 Black braided belt [employer]  Pt and belongings have been wanded by security. Pt is in scrubs.

## 2015-07-12 NOTE — Progress Notes (Signed)
Pt referred to the following facilities for inpt detox: [facility]- per Darrick Penna, fax referral but cannot guarantee detox bed will be available today [facility]- per Prentiss Bells, at capacity but fax referral for waitlist  All other detox facilities contacted are at capacity and not accepting referrals today.  Sharren Bridge, MSW, [place] Clinical Social Work, Disposition  07/12/2015 [phone]
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
## 2015-07-12 NOTE — ED Notes (Signed)
Report given to Katharine Look, pt ready for transfer to psych ED rom 41.
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
## 2015-07-12 NOTE — ED Notes (Signed)
Assumed care of patient, patient sleeping. Sitter present at bedside.

## 2015-07-12 NOTE — ED Provider Notes (Signed)
CSN: [id number]     Arrival date & time 07/12/15  0354 History   First MD Initiated Contact with Patient 07/12/15 [phone]     Chief Complaint  Patient presents with  . Homicidal     (Consider location/radiation/quality/duration/timing/severity/associated sxs/prior Treatment) HPI Comments: Patient is a 54 year old male who presents with homicidal ideations. Patient reports the feelings started this evening when he started drinking. He denies any specific plan of who he would kill but "he wants to hurt people." He denies SI. He also reports drinking heavily daily and wants to detox from alcohol. He reports drinking beer and his last drink was prior to arrival. He denies illicit drug use.    Past Medical History  Diagnosis Date  . Hepatitis C   . Tuberculosis   . Myocardial infarction   . Anxiety   . Chronic pain   . Alcohol abuse   . History of heroin abuse    Past Surgical History  Procedure Laterality Date  . Coronary stent placement     Family History  Problem Relation Age of Onset  . Heart attack      paternal uncle died MI 53   . Cancer      lung-maternal GM  . Cystic fibrosis      mom. died with pna  . Alcohol abuse      brother   History  Substance Use Topics  . Smoking status: Current Every Day Smoker -- 2.00 packs/day    Types: Cigarettes  . Smokeless tobacco: Never Used     Comment: trying to quit  . Alcohol Use: Yes     Comment: daily  alcohol   ale ,liquor     Review of Systems  Psychiatric/Behavioral: Positive for behavioral problems.  All other systems reviewed and are negative.     Allergies  Review of patient's allergies indicates no known allergies.  Home Medications   Prior to Admission medications   Medication Sig Start Date End Date Taking? Authorizing Provider  amLODipine (NORVASC) 5 MG tablet Take 1 tablet (5 mg total) by mouth daily. Patient not taking: Reported on 05/24/2015 05/08/15   Riccardo Dubin, MD  aspirin EC 81 MG EC tablet Take  1 tablet (81 mg total) by mouth daily. Patient not taking: Reported on 05/24/2015 05/08/15   Riccardo Dubin, MD  cephALEXin (KEFLEX) 500 MG capsule Take 1 capsule (500 mg total) by mouth 3 (three) times daily. Patient not taking: Reported on 05/24/2015 05/13/15   Rolland Porter, MD  nitroGLYCERIN (NITROSTAT) 0.4 MG SL tablet Place 1 tablet (0.4 mg total) under the tongue every 5 (five) minutes as needed for chest pain. Patient not taking: Reported on 06/12/2015 05/08/15   Riccardo Dubin, MD  polymixin-bacitracin (POLYSPORIN) 500-10000 UNIT/GM OINT ointment Apply 1 application topically 2 (two) times daily. Patient not taking: Reported on 05/24/2015 05/08/15   Riccardo Dubin, MD   BP 119/90 mmHg  Pulse 92  Temp(Src) 97.4 F (36.3 C) (Oral)  Resp 18  SpO2 99% Physical Exam  Constitutional: He appears well-developed and well-nourished. No distress.  HENT:  Head: Normocephalic and atraumatic.  Eyes: Conjunctivae and EOM are normal.  Neck: Normal range of motion.  Cardiovascular: Normal rate and regular rhythm.  Exam reveals no gallop and no friction rub.   No murmur heard. Pulmonary/Chest: Effort normal and breath sounds normal. He has no wheezes. He has no rales. He exhibits no tenderness.  Abdominal: Soft. He exhibits no distension. There is  no tenderness. There is no rebound.  Musculoskeletal: Normal range of motion.  Neurological: He is alert.  Speech is goal-oriented. Moves limbs without ataxia.   Skin: Skin is warm and dry.  Psychiatric: His behavior is normal.  Nursing note and vitals reviewed.   ED Course  Procedures (including critical care time) Labs Review Labs Reviewed  CBC  COMPREHENSIVE METABOLIC PANEL  ETHANOL  SALICYLATE LEVEL  ACETAMINOPHEN LEVEL  URINE RAPID DRUG SCREEN, HOSP PERFORMED    Imaging Review Dg Chest 2 View  07/11/2015   CLINICAL DATA:  Chest pain and shortness breath. History myocardial infarction 2014, tuberculosis.  EXAM: CHEST  2 VIEW  COMPARISON:   Chest radiograph May 24, 2015  FINDINGS: Cardiomediastinal silhouette is unremarkable. The lungs are clear without pleural effusions or focal consolidations. Trachea projects midline and there is no pneumothorax. Soft tissue planes and included osseous structures are non-suspicious.  IMPRESSION: No acute cardiopulmonary process.   Electronically Signed   By: Elon Alas M.D.   On: 07/11/2015 04:15     EKG Interpretation None      MDM   Final diagnoses:  Homicidal ideation  Alcohol abuse    5:29 AM Patient will have [facility]TS consult. Vitals stable and patient afebrile.    Alvina Chou, PA-C 07/12/15 3614  Everlene Balls, MD 07/12/15 1353

## 2015-07-12 NOTE — BH Assessment (Signed)
Assessment Note  Darrell [name] is an 54 y.o. male. Patient was brought into the ED by [facility] because of alcohol intoxication and homicidal ideations.  Patient's BAC on admission 246.  Patient was willing to wake up and answer some question although he continued to present with slurred speech, no eye contact, drowsy, and blunted.  Patient denies current SI, hallucinations, and other self-injurious behaviors.  Patient sacastically made homicidal threats towards President Obama and Daisy Floro. "I guess Obama, I guess Daisy Floro".  After making the previous statement he chuckled.  Patient reports drinking 24oz or more of beer daily.  Patient reports he started drinking at the age of 65 and been blacking out at age 77.  Patient reports he must drink at the least 24oz to feeling functional and reports a history of DTs after 3 days sober.    Patient denies a history of treatment although he was [facility] at [facility] and now [facility] ED for alcohol dependence.  Patient denies any current support.     Axis I: Alcohol use, severe Axis II: Deferred Axis III:  Past Medical History  Diagnosis Date  . Hepatitis C   . Tuberculosis   . Myocardial infarction   . Anxiety   . Chronic pain   . Alcohol abuse   . History of heroin abuse    Axis IV: economic problems, housing problems, occupational problems, other psychosocial or environmental problems, problems related to legal system/crime, problems related to social environment, problems with access to health care services and problems with primary support group Axis V: 41-50 serious symptoms  Past Medical History:  Past Medical History  Diagnosis Date  . Hepatitis C   . Tuberculosis   . Myocardial infarction   . Anxiety   . Chronic pain   . Alcohol abuse   . History of heroin abuse     Past Surgical History  Procedure Laterality Date  . Coronary stent placement      Family History:  Family History  Problem Relation Age of Onset  .  Heart attack      paternal uncle died MI 70   . Cancer      lung-maternal GM  . Cystic fibrosis      mom. died with pna  . Alcohol abuse      brother    Social History:  reports that he has been smoking Cigarettes.  He has been smoking about 2.00 packs per day. He has never used smokeless tobacco. He reports that he drinks alcohol. He reports that he uses illicit drugs (Marijuana).  Additional Social History:  Alcohol / Drug Use Pain Medications: see MARs Prescriptions: see MARs Over the Counter: See MARs History of alcohol / drug use?: Yes Longest period of sobriety (when/how long): none Negative Consequences of Use: Financial, Personal relationships, Work / Youth worker Withdrawal Symptoms: Delirium, Tingling, DTs, Diarrhea, Irritability, Change in blood pressure Substance #1 Name of Substance 1: alcohol 1 - Age of First Use: 13 1 - Amount (size/oz): 24oz or more 1 - Frequency: daily 1 - Duration: on/off since 15 1 - Last Use / Amount: 2 days ago per patient but on admission BAC 246  CIWA: CIWA-Ar BP: 141/93 mmHg Pulse Rate: 80 Nausea and Vomiting: 2 Tactile Disturbances: very mild itching, pins and needles, burning or numbness Tremor: not visible, but can be felt fingertip to fingertip Auditory Disturbances: not present Paroxysmal Sweats: no sweat visible Visual Disturbances: mild sensitivity Anxiety: mildly anxious Headache, Fullness in Head:  none present Agitation: two Orientation and Clouding of Sensorium: cannot do serial additions or is uncertain about date CIWA-Ar Total: 10 COWS:    Allergies: No Known Allergies  Home Medications:  (Not in a hospital admission)  OB/GYN Status:  No LMP for male patient.  General Assessment Data Location of Assessment: [facility] ED TTS Assessment: In system Is this a Tele or Face-to-Face Assessment?: Face-to-Face Is this an Initial Assessment or a Re-assessment for this encounter?: Initial Assessment Marital status: Single Is  patient pregnant?: No Pregnancy Status: No Living Arrangements: Alone Can pt return to current living arrangement?: Yes Admission Status: Voluntary Is patient capable of signing voluntary admission?: Yes Referral Source: MD Insurance type: [place] Screening Exam ([place]) Medical Exam completed: Yes  Crisis Care Plan Living Arrangements: Alone Name of Psychiatrist: none Name of Therapist: None  Education Status Is patient currently in school?: No Highest grade of school patient has completed: n/a Name of school: n/a Contact person: n/a  Risk to self with the past 6 months Suicidal Ideation: No-Not Currently/Within Last 6 Months Has patient been a risk to self within the past 6 months prior to admission? : No Suicidal Intent: No Has patient had any suicidal intent within the past 6 months prior to admission? : No Is patient at risk for suicide?: No Suicidal Plan?: No Has patient had any suicidal plan within the past 6 months prior to admission? : No Access to Means: No What has been your use of drugs/alcohol within the last 12 months?: alcohol Previous Attempts/Gestures: Yes How many times?: 1 Triggers for Past Attempts: Family contact, Other (Comment) (SA) Intentional Self Injurious Behavior: None Family Suicide History: No Recent stressful life event(s): Conflict (Comment), Loss (Comment), Financial Problems, Other (Comment) (SA) Persecutory voices/beliefs?: No Depression: No Depression Symptoms: Feeling angry/irritable, Guilt (SA increased) Substance abuse history and/or treatment for substance abuse?: Yes  Risk to Others within the past 6 months Homicidal Ideation: Yes-Currently Present Does patient have any lifetime risk of violence toward others beyond the six months prior to admission? : No Thoughts of Harm to Others: Yes-Currently Present Comment - Thoughts of Harm to Others: Pt threaten to kill Obama and Daisy Floro Current Homicidal Intent:  No-Not Currently/Within Last 6 Months Current Homicidal Plan: No-Not Currently/Within Last 6 Months Access to Homicidal Means: No Identified Victim: Obama and Elenore Rota Trump History of harm to others?: No Assessment of Violence: In past 6-12 months Violent Behavior Description: fighting Does patient have access to weapons?: Yes (Comment) Criminal Charges Pending?: Yes Describe Pending Criminal Charges: trespassing and open containter Does patient have a court date: Yes Court Date: 07/16/15 Is patient on probation?: Unknown  Psychosis Hallucinations: None noted Delusions: None noted  Mental Status Report Appearance/Hygiene: In hospital gown Eye Contact: Poor Motor Activity: Unsteady, Restlessness Speech: Slurred Level of Consciousness: Drowsy, Irritable, Restless Mood: Labile Affect: Irritable Anxiety Level: Moderate Thought Processes: Circumstantial Judgement: Impaired Orientation: Person, Situation Obsessive Compulsive Thoughts/Behaviors: None  Cognitive Functioning Concentration: Poor Memory: Unable to Assess IQ: Average Insight: Poor Impulse Control: Poor Appetite: Fair Sleep: No Change Vegetative Symptoms: None  ADLScreening [facility] Assessment Services) Patient's cognitive ability adequate to safely complete daily activities?: Yes Patient able to express need for assistance with ADLs?: Yes Independently performs ADLs?: Yes (appropriate for developmental age)  Prior Inpatient Therapy Prior Inpatient Therapy: Yes Prior Therapy Dates: Unsure Prior Therapy Facilty/Provider(s): [facility] Reason for Treatment: Substance Abuse  Prior Outpatient Therapy Prior Outpatient Therapy: No Does patient have an ACCT team?:  No Does patient have Intensive In-House Services?  : No Does patient have [facility] services? : No Does patient have P4CC services?: No  ADL Screening (condition at time of admission) Patient's cognitive ability adequate to safely complete daily activities?:  Yes Patient able to express need for assistance with ADLs?: Yes Independently performs ADLs?: Yes (appropriate for developmental age)       Abuse/Neglect Assessment (Assessment to be complete while patient is alone) Physical Abuse: Denies Verbal Abuse: Denies Sexual Abuse: Denies Exploitation of patient/patient's resources: Denies Self-Neglect: Denies Values / Beliefs Cultural Requests During Hospitalization: None Spiritual Requests During Hospitalization: None Consults Spiritual Care Consult Needed: No Social Work Consult Needed: No Regulatory affairs officer (For Healthcare) Does patient have an advance directive?: No Would patient like information on creating an advanced directive?: No - patient declined information    Additional Information 1:1 In Past 12 Months?: No CIRT Risk: No Elopement Risk: No Does patient have medical clearance?: Yes     Disposition:  Disposition Initial Assessment Completed for this Encounter: Yes Disposition of Patient: Other dispositions Other disposition(s): Other (Comment) (pending)  On Site Evaluation by:   Reviewed with Physician:    Chesley Noon A 07/12/2015 11:15 AM

## 2015-07-13 NOTE — ED Notes (Signed)
Patient discharged.  Denies thoughts of harm to self or others.  He was given resources for treatment/rehab facilities.  He was also given a bus pass.  Patient left the unit ambulatory.  All belongings were returned and signed for.
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
## 2015-07-13 NOTE — Discharge Instructions (Signed)
To help you maintain a sober lifestyle, a substance abuse treatment program may be beneficial to you.  Contact one of the following providers at your earliest opportunity to see about enrolling in their program:  RESIDENTIAL PROGRAMS:       [facility]      [place], [place] [address]      (336) [facility] [place]      [address] [place], [place] [address]      [phone]       [facility]      [place], [place] [address]      [phone]   OUTPATIENT PROGRAMS:       [facility]      [address] [place]. [place], [place] [address]      [phone]      New patients are seen at the walk-in clinic every Tuesday from 9:00 am - 12:00 pm.       [facility]      [address]      [place], [place] [address]      [phone]       [facility]      [address] [place], [place] [address]      [phone]
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
## 2015-07-14 ENCOUNTER — Emergency Department (HOSPITAL_COMMUNITY)
Admission: EM | Admit: 2015-07-14 | Discharge: 2015-07-14 | Disposition: A | Discharge status: Home / Self Care | Payer: BLUE CROSS/BLUE SHIELD | Attending: Emergency Medicine | Admitting: Emergency Medicine

## 2015-07-14 ENCOUNTER — Encounter (HOSPITAL_COMMUNITY): Payer: Self-pay

## 2015-07-14 DIAGNOSIS — F1020 Alcohol dependence, uncomplicated: Secondary | ICD-10-CM | POA: Insufficient documentation

## 2015-07-14 DIAGNOSIS — Z8619 Personal history of other infectious and parasitic diseases: Secondary | ICD-10-CM | POA: Insufficient documentation

## 2015-07-14 DIAGNOSIS — Z8659 Personal history of other mental and behavioral disorders: Secondary | ICD-10-CM | POA: Insufficient documentation

## 2015-07-14 DIAGNOSIS — Z9861 Coronary angioplasty status: Secondary | ICD-10-CM | POA: Insufficient documentation

## 2015-07-14 DIAGNOSIS — I252 Old myocardial infarction: Secondary | ICD-10-CM | POA: Insufficient documentation

## 2015-07-14 DIAGNOSIS — Z720 Tobacco use: Secondary | ICD-10-CM | POA: Insufficient documentation

## 2015-07-14 DIAGNOSIS — G8929 Other chronic pain: Secondary | ICD-10-CM | POA: Insufficient documentation

## 2015-07-14 NOTE — ED Notes (Signed)
Pt states that he feels like he unable to take in a full breath and reports periods of sob.

## 2015-07-14 NOTE — ED Notes (Signed)
[facility] per MD request to allow pt to seek resources for Alcohol Problem.

## 2015-07-14 NOTE — ED Provider Notes (Signed)
CSN: [id number]     Arrival date & time 07/14/15  2440 History   First MD Initiated Contact with Patient 07/14/15 [phone]     Chief Complaint  Patient presents with  . Alcohol Problem  . Shortness of Breath     (Consider location/radiation/quality/duration/timing/severity/associated sxs/prior Treatment) HPI 54 year old male presents to emergency department with complaint of alcohol intoxication, request for alcohol detox.  He also complains of shortness of breath.  Patient is a heavy smoker.  He denies any cough, no wheezing.  Patient requesting found to call adat  Past Medical History  Diagnosis Date  . Hepatitis C   . Tuberculosis   . Myocardial infarction   . Anxiety   . Chronic pain   . Alcohol abuse   . History of heroin abuse    Past Surgical History  Procedure Laterality Date  . Coronary stent placement     Family History  Problem Relation Age of Onset  . Heart attack      paternal uncle died MI 31   . Cancer      lung-maternal GM  . Cystic fibrosis      mom. died with pna  . Alcohol abuse      brother   History  Substance Use Topics  . Smoking status: Current Every Day Smoker -- 2.00 packs/day    Types: Cigarettes  . Smokeless tobacco: Never Used     Comment: trying to quit  . Alcohol Use: Yes     Comment: daily  alcohol   ale ,liquor     Review of Systems  See History of Present Illness; otherwise all other systems are reviewed and negative   Allergies  Review of patient's allergies indicates no known allergies.  Home Medications   Prior to Admission medications   Medication Sig Start Date End Date Taking? Authorizing Provider  amLODipine (NORVASC) 5 MG tablet Take 1 tablet (5 mg total) by mouth daily. Patient not taking: Reported on 05/24/2015 05/08/15   Riccardo Dubin, MD  aspirin EC 81 MG EC tablet Take 1 tablet (81 mg total) by mouth daily. Patient not taking: Reported on 05/24/2015 05/08/15   Riccardo Dubin, MD  nitroGLYCERIN (NITROSTAT) 0.4 MG SL  tablet Place 1 tablet (0.4 mg total) under the tongue every 5 (five) minutes as needed for chest pain. Patient not taking: Reported on 06/12/2015 05/08/15   Riccardo Dubin, MD   BP 102/68 mmHg  Pulse 75  Temp(Src) 98.1 F (36.7 C) (Oral)  Resp 12  SpO2 90% Physical Exam  Constitutional: He is oriented to person, place, and time. He appears well-developed and well-nourished.  HENT:  Head: Normocephalic and atraumatic.  Nose: Nose normal.  Mouth/Throat: Oropharynx is clear and moist.  Eyes: Conjunctivae and EOM are normal. Pupils are equal, round, and reactive to light.  Neck: Normal range of motion. Neck supple. No JVD present. No tracheal deviation present. No thyromegaly present.  Cardiovascular: Normal rate, regular rhythm, normal heart sounds and intact distal pulses.  Exam reveals no gallop and no friction rub.   No murmur heard. Pulmonary/Chest: Effort normal and breath sounds normal. No stridor. No respiratory distress. He has no wheezes. He has no rales. He exhibits no tenderness.  Prolonged expiratory phase  Abdominal: Soft. Bowel sounds are normal. He exhibits no distension and no mass. There is no tenderness. There is no rebound and no guarding.  Musculoskeletal: Normal range of motion. He exhibits no edema or tenderness.  Lymphadenopathy:  He has no cervical adenopathy.  Neurological: He is alert and oriented to person, place, and time. He displays normal reflexes. He exhibits normal muscle tone. Coordination normal.  Skin: Skin is warm and dry. No rash noted. No erythema. No pallor.  Psychiatric: He has a normal mood and affect. His behavior is normal. Judgment and thought content normal.  Nursing note and vitals reviewed.   ED Course  Procedures (including critical care time) Labs Review Labs Reviewed - No data to display  Imaging Review No results found.   EKG Interpretation None      MDM   Final diagnoses:  Alcohol use disorder, severe, dependence     54 year old male with alcohol dependence.  Patient does not have withdrawal seizures.  He has attempted to contact to facilities, but there do not appear to be beds at this time.  I had a frank discussion with the patient that he will need to make a conscious decision to stop drinking.  I have offered Librium which she refuses and reports only Ativan works with for his withdrawals.  Patient reports that he has no money to get transportation to the rehabilitation facilities.  Patient complains of shortness of breath, but is not dyspneic, speaks in full sentences, no cough, and no wheezing on exam.  Patient advised to stop smoking.  Or at least cut back.    Linton Flemings, MD 07/14/15 [phone]

## 2015-07-14 NOTE — Discharge Instructions (Signed)
Finding Treatment for Alcohol and Drug Addiction It can be hard to find the right place to get professional treatment. Here are some important things to consider:  There are different types of treatment to choose from.  Some programs are live-in (residential) while others are not (outpatient). Sometimes a combination is offered.  No single type of program is right for everyone.  Most treatment programs involve a combination of education, counseling, and a 12-step, spiritually-based approach.  There are non-spiritually based programs (not 12-step).  Some treatment programs are government sponsored. They are geared for patients without private insurance.  Treatment programs can vary in many respects such as:  Cost and types of insurance accepted.  Types of on-site medical services offered.  Length of stay, setting, and size.  Overall philosophy of treatment. A person may need specialized treatment or have needs not addressed by all programs. For example, adolescents need treatment appropriate for their age. Other people have secondary disorders that must be managed as well. Secondary conditions can include mental illness, such as depression or diabetes. Often, a period of detoxification from alcohol or drugs is needed. This requires medical supervision and not all programs offer this. THINGS TO CONSIDER WHEN SELECTING A TREATMENT PROGRAM   Is the program certified by the appropriate government agency? Even private programs must be certified and employ certified professionals.  Does the program accept your insurance? If not, can a payment plan be set up?  Is the facility clean, organized, and well run? Do they allow you to speak with graduates who can share their treatment experience with you? Can you tour the facility? Can you meet with staff?  Does the program meet the full range of individual needs?  Does the treatment program address sexual orientation and physical disabilities?  Do they provide age, gender, and culturally appropriate treatment services?  Is treatment available in languages other than English?  Is long-term aftercare support or guidance encouraged and provided?  Is assessment of an individual's treatment plan ongoing to ensure it meets changing needs?  Does the program use strategies to encourage reluctant patients to remain in treatment long enough to increase the likelihood of success?  Does the program offer counseling (individual or group) and other behavioral therapies?  Does the program offer medicine as part of the treatment regimen, if needed?  Is there ongoing monitoring of possible relapse? Is there a defined relapse prevention program? Are services or referrals offered to family members to ensure they understand addiction and the recovery process? This would help them support the recovering individual.  Are 12-step meetings held at the center or is transport available for patients to attend outside meetings? In countries outside of the [place] and [place], [employer], [employer] for contact information for services in your area. Document Released: 11/10/2005 Document Revised: 03/05/2012 Document Reviewed: 05/22/2008 [facility] Patient Information 2015 [place], [place]. This information is not intended to replace advice given to you by your health care provider. Make sure you discuss any questions you have with your health care provider.  Alcohol Use Disorder Alcohol use disorder is a mental disorder. It is not a one-time incident of heavy drinking. Alcohol use disorder is the excessive and uncontrollable use of alcohol over time that leads to problems with functioning in one or more areas of daily living. People with this disorder risk harming themselves and others when they drink to excess. Alcohol use disorder also can cause other mental disorders, such as mood and anxiety disorders, and serious physical problems.  People with alcohol use disorder  often misuse other drugs.  Alcohol use disorder is common and widespread. Some people with this disorder drink alcohol to cope with or escape from negative life events. Others drink to relieve chronic pain or symptoms of mental illness. People with a family history of alcohol use disorder are at higher risk of losing control and using alcohol to excess.  SYMPTOMS  Signs and symptoms of alcohol use disorder may include the following:   Consumption ofalcohol inlarger amounts or over a longer period of time than intended.  Multiple unsuccessful attempts to cutdown or control alcohol use.   A great deal of time spent obtaining alcohol, using alcohol, or recovering from the effects of alcohol (hangover).  A strong desire or urge to use alcohol (cravings).   Continued use of alcohol despite problems at work, school, or home because of alcohol use.   Continued use of alcohol despite problems in relationships because of alcohol use.  Continued use of alcohol in situations when it is physically hazardous, such as driving a car.  Continued use of alcohol despite awareness of a physical or psychological problem that is likely related to alcohol use. Physical problems related to alcohol use can involve the brain, heart, liver, stomach, and intestines. Psychological problems related to alcohol use include intoxication, depression, anxiety, psychosis, delirium, and dementia.   The need for increased amounts of alcohol to achieve the same desired effect, or a decreased effect from the consumption of the same amount of alcohol (tolerance).  Withdrawal symptoms upon reducing or stopping alcohol use, or alcohol use to reduce or avoid withdrawal symptoms. Withdrawal symptoms include:  Racing heart.  Hand tremor.  Difficulty sleeping.  Nausea.  Vomiting.  Hallucinations.  Restlessness.  Seizures. DIAGNOSIS Alcohol use disorder is diagnosed through an assessment by your health care  provider. Your health care provider may start by asking three or four questions to screen for excessive or problematic alcohol use. To confirm a diagnosis of alcohol use disorder, at least two symptoms must be present within a 16-month period. The severity of alcohol use disorder depends on the number of symptoms:  Mild--two or three.  Moderate--four or five.  Severe--six or more. Your health care provider may perform a physical exam or use results from lab tests to see if you have physical problems resulting from alcohol use. Your health care provider may refer you to a mental health professional for evaluation. TREATMENT  Some people with alcohol use disorder are able to reduce their alcohol use to low-risk levels. Some people with alcohol use disorder need to quit drinking alcohol. When necessary, mental health professionals with specialized training in substance use treatment can help. Your health care provider can help you decide how severe your alcohol use disorder is and what type of treatment you need. The following forms of treatment are available:   Detoxification. Detoxification involves the use of prescription medicines to prevent alcohol withdrawal symptoms in the first week after quitting. This is important for people with a history of symptoms of withdrawal and for heavy drinkers who are likely to have withdrawal symptoms. Alcohol withdrawal can be dangerous and, in severe cases, cause death. Detoxification is usually provided in a hospital or in-patient substance use treatment facility.  Counseling or talk therapy. Talk therapy is provided by substance use treatment counselors. It addresses the reasons people use alcohol and ways to keep them from drinking again. The goals of talk therapy are to help people with alcohol use  disorder find healthy activities and ways to cope with life stress, to identify and avoid triggers for alcohol use, and to handle cravings, which can cause  relapse.  Medicines.Different medicines can help treat alcohol use disorder through the following actions:  Decrease alcohol cravings.  Decrease the positive reward response felt from alcohol use.  Produce an uncomfortable physical reaction when alcohol is used (aversion therapy).  Support groups. Support groups are run by people who have quit drinking. They provide emotional support, advice, and guidance. These forms of treatment are often combined. Some people with alcohol use disorder benefit from intensive combination treatment provided by specialized substance use treatment centers. Both inpatient and outpatient treatment programs are available. Document Released: 01/19/2005 Document Revised: 04/28/2014 Document Reviewed: 03/21/2013 [facility] Patient Information 2015 [place], [place]. This information is not intended to replace advice given to you by your health care provider. Make sure you discuss any questions you have with your health care provider.  Alcohol Withdrawal Alcohol withdrawal happens when you normally drink alcohol a lot and suddenly stop drinking. Alcohol withdrawal symptoms can be mild to very bad. Mild withdrawal symptoms can include feeling sick to your stomach (nauseous), headache, or feeling irritable. Bad withdrawal symptoms can include shakiness, being very nervous (anxious), and not thinking clearly.  HOME CARE  Join an alcohol support group.  Stay away from people or situations that make you want to drink.  Eat a healthy diet. Eat a lot of fresh fruits, vegetables, and lean meats. GET HELP RIGHT AWAY IF:   You become confused. You start to see and hear things that are not really there.  You feel your heart beating very fast.  You throw up (vomit) blood or cannot stop throwing up. This may be bright red or look like black coffee grounds.  You have blood in your poop (stool). This may be bright red, maroon colored, or black and tarry.  You are lightheaded  or pass out (faint).  You develop a fever. MAKE SURE YOU:   Understand these instructions.  Will watch your condition.  Will get help right away if you are not doing well or get worse. Document Released: 05/30/2008 Document Revised: 03/05/2012 Document Reviewed: 05/30/2008 [facility] Patient Information 2015 [place], [place]. This information is not intended to replace advice given to you by your health care provider. Make sure you discuss any questions you have with your health care provider. Behavioral Health Resources in the [facility]  Intensive Outpatient Programs: [facility]      [place]. [place], [place] Both a day and evening program       [facility] Outpatient     [address]        [place], [place] [address] [phone]         ADS: [facility] [place]: [phone] or [phone] [address] [place], [place] [address] [email or web address]  Mobile Crisis Teams:                                        [facility]         Mobile Crisis Care Unit [phone]             [facility] [address] [place] [phone]  [place] [address] [place] [phone]  Self-Help/Support Groups: [facility] [employer] of support groups [phone] (call for more info)  Narcotics Anonymous (NA) Caring [facility] [address] [place] - 2 meetings at this location  Residential Treatment Programs:  [place]       [place] [address] [place], [place]  [address] [place]  [address] [place], [place] [address] [phone] Admissions: 8am-3pm M-F  [facility] [place]     [address] [place], [place] [address]       [phone]         [facility] [address] Jadene Pierini [place], [place]  [facility] [address] [place], [place]  [facility] - Intensive Outpatient      [address] [place] [address]     [place], [place]         [facility] ([place]     [place], [place] or [phone]  Residential Treatment Services (RTS)  [place], [place]  [facility] [address]                                               [place]  [facility] Resources: [phone]               General Therapy                                                Domenic Schwab, PhD        [address] [place], [place] [address]         [facility]   [address] [place], [place] [address] [phone]  [facility] [address] [place], [place] [address] [phone] Insurance/Medicaid/sponsorship through [facility] and Families                                              [place]                                        [place], [place] [address]    Therapy/tele-psych/case         [place], [place]  [phone]  Adolescent/group home/case management  Hyndman PhD       General therapy       Insurance   [phone]         Dr. Adele Schilder Insurance [phone] M-F  [facility] Medicaid, sponsorship [phone]

## 2015-07-14 NOTE — ED Notes (Signed)
Bed: [facility] Expected date:  Expected time:  Means of arrival:  Comments: EMS/ETOH 

## 2015-11-23 ENCOUNTER — Emergency Department (HOSPITAL_COMMUNITY): Payer: BLUE CROSS/BLUE SHIELD

## 2015-11-23 ENCOUNTER — Emergency Department (HOSPITAL_COMMUNITY)
Admission: EM | Admit: 2015-11-23 | Discharge: 2015-11-24 | Disposition: A | Discharge status: Home / Self Care | Payer: BLUE CROSS/BLUE SHIELD | Attending: Emergency Medicine | Admitting: Emergency Medicine

## 2015-11-23 ENCOUNTER — Encounter (HOSPITAL_COMMUNITY): Payer: Self-pay | Admitting: Emergency Medicine

## 2015-11-23 DIAGNOSIS — I252 Old myocardial infarction: Secondary | ICD-10-CM | POA: Insufficient documentation

## 2015-11-23 DIAGNOSIS — Z8611 Personal history of tuberculosis: Secondary | ICD-10-CM | POA: Insufficient documentation

## 2015-11-23 DIAGNOSIS — R079 Chest pain, unspecified: Secondary | ICD-10-CM | POA: Insufficient documentation

## 2015-11-23 DIAGNOSIS — F17210 Nicotine dependence, cigarettes, uncomplicated: Secondary | ICD-10-CM | POA: Insufficient documentation

## 2015-11-23 DIAGNOSIS — Z9861 Coronary angioplasty status: Secondary | ICD-10-CM | POA: Insufficient documentation

## 2015-11-23 DIAGNOSIS — F419 Anxiety disorder, unspecified: Secondary | ICD-10-CM | POA: Insufficient documentation

## 2015-11-23 DIAGNOSIS — Z8619 Personal history of other infectious and parasitic diseases: Secondary | ICD-10-CM | POA: Insufficient documentation

## 2015-11-23 DIAGNOSIS — F10920 Alcohol use, unspecified with intoxication, uncomplicated: Secondary | ICD-10-CM

## 2015-11-23 DIAGNOSIS — G8929 Other chronic pain: Secondary | ICD-10-CM | POA: Insufficient documentation

## 2015-11-23 DIAGNOSIS — Z7982 Long term (current) use of aspirin: Secondary | ICD-10-CM | POA: Insufficient documentation

## 2015-11-23 DIAGNOSIS — F10120 Alcohol abuse with intoxication, uncomplicated: Secondary | ICD-10-CM | POA: Insufficient documentation

## 2015-11-23 DIAGNOSIS — R440 Auditory hallucinations: Secondary | ICD-10-CM

## 2015-11-23 LAB — RAPID URINE DRUG SCREEN, HOSP PERFORMED
Amphetamines: NOT DETECTED
BARBITURATES: NOT DETECTED
Benzodiazepines: NOT DETECTED
Cocaine: NOT DETECTED
Opiates: NOT DETECTED
Tetrahydrocannabinol: NOT DETECTED

## 2015-11-23 LAB — BASIC METABOLIC PANEL
Anion gap: 13 (ref 5–15)
BUN: 5 mg/dL — ABNORMAL LOW (ref 6–20)
CALCIUM: 8.9 mg/dL (ref 8.9–10.3)
CO2: 22 mmol/L (ref 22–32)
Chloride: 101 mmol/L (ref 101–111)
Creatinine, Ser: 0.9 mg/dL (ref 0.61–1.24)
GFR calc Af Amer: >60 mL/min (ref >60)
GLUCOSE: 124 mg/dL — AB (ref 65–99)
Potassium: 3.7 mmol/L (ref 3.5–5.1)
Sodium: 136 mmol/L (ref 135–145)

## 2015-11-23 LAB — CBC
HCT: 47.3 % (ref 39.0–52.0)
Hemoglobin: 16.8 g/dL (ref 13.0–17.0)
MCH: 31.2 pg (ref 26.0–34.0)
MCHC: 35.5 g/dL (ref 30.0–36.0)
MCV: 87.8 fL (ref 78.0–100.0)
Platelets: 216 10*3/uL (ref 150–400)
RBC: 5.39 MIL/uL (ref 4.22–5.81)
RDW: 12.5 % (ref 11.5–15.5)
WBC: 8 10*3/uL (ref 4.0–10.5)

## 2015-11-23 LAB — HEPATIC FUNCTION PANEL
ALBUMIN: 4.7 g/dL (ref 3.5–5.0)
ALK PHOS: 66 U/L (ref 38–126)
ALT: 45 U/L (ref 17–63)
AST: 37 U/L (ref 15–41)
BILIRUBIN INDIRECT: 0.6 mg/dL (ref 0.3–0.9)
Bilirubin, Direct: 0.2 mg/dL (ref 0.1–0.5)
Total Bilirubin: 0.8 mg/dL (ref 0.3–1.2)
Total Protein: 7.6 g/dL (ref 6.5–8.1)

## 2015-11-23 LAB — I-STAT TROPONIN, ED: TROPONIN I, POC: 0.01 ng/mL (ref 0.00–0.08)

## 2015-11-23 LAB — LIPASE, BLOOD: Lipase: 66 U/L — ABNORMAL HIGH (ref 11–51)

## 2015-11-23 MED ORDER — GI COCKTAIL ~~LOC~~
30.0000 mL | Freq: Once | ORAL | Status: AC
  Administered 2015-11-23: 30 mL via ORAL
  Filled 2015-11-23: qty 30

## 2015-11-23 MED ORDER — THIAMINE HCL 100 MG/ML IJ SOLN
100.0000 mg | Freq: Once | INTRAMUSCULAR | Status: AC
  Administered 2015-11-23: 100 mg via INTRAVENOUS
  Filled 2015-11-23: qty 2

## 2015-11-23 NOTE — ED Notes (Signed)
Per ems-- pt c/o chest pain relieved by 324 asa administered pta. Pt is intoxicated. Pt sts he drank "alot of alcohol". also reporting that is having command hallucinations. sts they tell him to do inappropriate things and to harm himself. sts he does not want to hurt himself.

## 2015-11-24 ENCOUNTER — Emergency Department (HOSPITAL_COMMUNITY)
Admission: EM | Admit: 2015-11-24 | Discharge: 2015-11-25 | Disposition: A | Discharge status: Home / Self Care | Payer: BLUE CROSS/BLUE SHIELD | Source: Home / Self Care | Attending: Emergency Medicine | Admitting: Emergency Medicine

## 2015-11-24 ENCOUNTER — Encounter (HOSPITAL_COMMUNITY): Payer: Self-pay | Admitting: Family Medicine

## 2015-11-24 DIAGNOSIS — R440 Auditory hallucinations: Secondary | ICD-10-CM | POA: Diagnosis not present

## 2015-11-24 DIAGNOSIS — Z8611 Personal history of tuberculosis: Secondary | ICD-10-CM | POA: Insufficient documentation

## 2015-11-24 DIAGNOSIS — Z9861 Coronary angioplasty status: Secondary | ICD-10-CM | POA: Insufficient documentation

## 2015-11-24 DIAGNOSIS — F1910 Other psychoactive substance abuse, uncomplicated: Secondary | ICD-10-CM | POA: Diagnosis not present

## 2015-11-24 DIAGNOSIS — F1020 Alcohol dependence, uncomplicated: Secondary | ICD-10-CM

## 2015-11-24 DIAGNOSIS — Z8619 Personal history of other infectious and parasitic diseases: Secondary | ICD-10-CM

## 2015-11-24 DIAGNOSIS — F17210 Nicotine dependence, cigarettes, uncomplicated: Secondary | ICD-10-CM

## 2015-11-24 DIAGNOSIS — Z7982 Long term (current) use of aspirin: Secondary | ICD-10-CM | POA: Insufficient documentation

## 2015-11-24 DIAGNOSIS — G8929 Other chronic pain: Secondary | ICD-10-CM | POA: Insufficient documentation

## 2015-11-24 DIAGNOSIS — Z79899 Other long term (current) drug therapy: Secondary | ICD-10-CM | POA: Insufficient documentation

## 2015-11-24 DIAGNOSIS — I252 Old myocardial infarction: Secondary | ICD-10-CM | POA: Insufficient documentation

## 2015-11-24 LAB — URINALYSIS, ROUTINE W REFLEX MICROSCOPIC
BILIRUBIN URINE: NEGATIVE
Glucose, UA: 100 mg/dL — AB
HGB URINE DIPSTICK: NEGATIVE
Ketones, ur: NEGATIVE mg/dL
Leukocytes, UA: NEGATIVE
Nitrite: NEGATIVE
PH: 5.5 (ref 5.0–8.0)
Protein, ur: NEGATIVE mg/dL
Specific Gravity, Urine: 1.006 (ref 1.005–1.030)

## 2015-11-24 LAB — ETHANOL: Alcohol, Ethyl (B): 282 mg/dL — ABNORMAL HIGH (ref <5)

## 2015-11-24 LAB — I-STAT TROPONIN, ED: Troponin i, poc: 0.01 ng/mL (ref 0.00–0.08)

## 2015-11-24 MED ORDER — ACETAMINOPHEN 325 MG PO TABS: 325.0000 mg | ORAL_TABLET | ORAL | Status: DC | PRN

## 2015-11-24 MED ORDER — LORAZEPAM 1 MG PO TABS: 0.0000 mg | ORAL_TABLET | Freq: Four times a day (QID) | ORAL | Status: DC

## 2015-11-24 MED ORDER — THIAMINE HCL 100 MG/ML IJ SOLN: 100.0000 mg | Freq: Every day | INTRAMUSCULAR | Status: DC

## 2015-11-24 MED ORDER — LORAZEPAM 2 MG/ML IJ SOLN: 0.0000 mg | Freq: Two times a day (BID) | INTRAMUSCULAR | Status: DC

## 2015-11-24 MED ORDER — LORAZEPAM 1 MG PO TABS: 0.0000 mg | ORAL_TABLET | Freq: Two times a day (BID) | ORAL | Status: DC

## 2015-11-24 MED ORDER — VITAMIN B-1 100 MG PO TABS
100.0000 mg | ORAL_TABLET | Freq: Every day | ORAL | Status: DC
  Administered 2015-11-24: 100 mg via ORAL
  Filled 2015-11-24: qty 1

## 2015-11-24 MED ORDER — LORAZEPAM 2 MG/ML IJ SOLN: 0.0000 mg | Freq: Four times a day (QID) | INTRAMUSCULAR | Status: DC

## 2015-11-24 MED ORDER — AMLODIPINE BESYLATE 5 MG PO TABS
5.0000 mg | ORAL_TABLET | Freq: Every day | ORAL | Status: DC
  Administered 2015-11-24: 5 mg via ORAL
  Filled 2015-11-24: qty 1

## 2015-11-24 MED ORDER — ASPIRIN EC 81 MG PO TBEC
81.0000 mg | DELAYED_RELEASE_TABLET | Freq: Every day | ORAL | Status: DC
  Administered 2015-11-24: 81 mg via ORAL
  Filled 2015-11-24: qty 1

## 2015-11-24 NOTE — ED Notes (Signed)
Telepsych being performed. 

## 2015-11-24 NOTE — ED Notes (Signed)
TTS being completed. 

## 2015-11-24 NOTE — ED Notes (Addendum)
Snack and drink given to patient, and Regular diet order taken for lunch.
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
## 2015-11-24 NOTE — ED Provider Notes (Signed)
CSN: [id number]     Arrival date & time 11/23/15  2128 History   First MD Initiated Contact with Patient 11/23/15 2147     Chief Complaint  Patient presents with  . Alcohol Intoxication  . Hallucinations  . Chest Pain     (Consider location/radiation/quality/duration/timing/severity/associated sxs/prior Treatment) HPI Comments: 54 y.o. Male with history of CAD, CKD, HTN, alcohol abuse, depression presents for chest pain as well as auditory hallucinations.  The patient reports that he has been hearing voices for the last month or so that has happened before and that the voices are telling him to kill himself and other people.  Patient reports that his chest pain resolved after EMS gave him ASA.  No shortness of breath.  No leg pain or swelling.    Patient is a 54 y.o. male presenting with intoxication and chest pain.  Alcohol Intoxication Associated symptoms include chest pain. Pertinent negatives include no abdominal pain, no headaches and no shortness of breath.  Chest Pain Associated symptoms: no abdominal pain, no back pain, no cough, no dizziness, no fatigue, no fever, no headache, no nausea, no palpitations, no shortness of breath, not vomiting and no weakness     Past Medical History  Diagnosis Date  . Hepatitis C   . Tuberculosis   . Myocardial infarction ([place])   . Anxiety   . Chronic pain   . Alcohol abuse   . History of heroin abuse    Past Surgical History  Procedure Laterality Date  . Coronary stent placement     Family History  Problem Relation Age of Onset  . Heart attack      paternal uncle died MI 1   . Cancer      lung-maternal GM  . Cystic fibrosis      mom. died with pna  . Alcohol abuse      brother   Social History  Substance Use Topics  . Smoking status: Current Every Day Smoker -- 2.00 packs/day    Types: Cigarettes  . Smokeless tobacco: Never Used     Comment: trying to quit  . Alcohol Use: Yes     Comment: daily  alcohol   ale ,liquor      Review of Systems  Constitutional: Negative for fever, chills and fatigue.  HENT: Negative for congestion, postnasal drip, rhinorrhea, sinus pressure and voice change.   Respiratory: Negative for cough, chest tightness and shortness of breath.   Cardiovascular: Positive for chest pain. Negative for palpitations.  Gastrointestinal: Negative for nausea, vomiting, abdominal pain and diarrhea.  Genitourinary: Negative for dysuria, urgency, frequency, hematuria and decreased urine volume.  Musculoskeletal: Negative for myalgias and back pain.  Skin: Negative for rash.  Neurological: Negative for dizziness, weakness and headaches.  Psychiatric/Behavioral: Positive for suicidal ideas and hallucinations. Negative for confusion. The patient is nervous/anxious.       Allergies  Review of patient's allergies indicates no known allergies.  Home Medications   Prior to Admission medications   Medication Sig Start Date End Date Taking? Authorizing Provider  amLODipine (NORVASC) 5 MG tablet Take 1 tablet (5 mg total) by mouth daily. Patient not taking: Reported on 05/24/2015 05/08/15   Riccardo Dubin, MD  aspirin EC 81 MG EC tablet Take 1 tablet (81 mg total) by mouth daily. Patient not taking: Reported on 05/24/2015 05/08/15   Riccardo Dubin, MD  nitroGLYCERIN (NITROSTAT) 0.4 MG SL tablet Place 1 tablet (0.4 mg total) under the tongue every 5 (five) minutes  as needed for chest pain. Patient not taking: Reported on 06/12/2015 05/08/15   Riccardo Dubin, MD   BP 114/78 mmHg  Pulse 101  Temp(Src) 97.9 F (36.6 C) (Oral)  Resp 24  SpO2 96% Physical Exam  Constitutional: He is oriented to person, place, and time. He appears well-developed and well-nourished. No distress.  HENT:  Head: Normocephalic and atraumatic.  Right Ear: External ear normal.  Left Ear: External ear normal.  Mouth/Throat: Oropharynx is clear and moist. No oropharyngeal exudate.  Eyes: EOM are normal. Pupils are equal, round,  and reactive to light.  Neck: Normal range of motion. Neck supple.  Cardiovascular: Normal rate, regular rhythm, normal heart sounds and intact distal pulses.   No murmur heard. Pulmonary/Chest: Effort normal. No respiratory distress. He has no wheezes. He has no rales.  Abdominal: Soft. He exhibits no distension. There is no tenderness.  Musculoskeletal: He exhibits no edema.  Neurological: He is alert and oriented to person, place, and time. No cranial nerve deficit. He exhibits normal muscle tone. Coordination normal.  Up walking around the room without difficulty.  No issues with coordination.  Skin: Skin is warm and dry. No rash noted. He is not diaphoretic.  Psychiatric: His speech is rapid and/or pressured. He is hyperactive and actively hallucinating. He expresses homicidal and suicidal ideation.  Vitals reviewed.   ED Course  Procedures (including critical care time) Labs Review Labs Reviewed  BASIC METABOLIC PANEL - Abnormal; Notable for the following:    Glucose, Bld 124 (*)    BUN 5 (*)    All other components within normal limits  ETHANOL - Abnormal; Notable for the following:    Alcohol, Ethyl (B) 282 (*)    All other components within normal limits  LIPASE, BLOOD - Abnormal; Notable for the following:    Lipase 66 (*)    All other components within normal limits  URINALYSIS, ROUTINE W REFLEX MICROSCOPIC (NOT AT [employer]) - Abnormal; Notable for the following:    Glucose, UA 100 (*)    All other components within normal limits  CBC  URINE RAPID DRUG SCREEN, HOSP PERFORMED  HEPATIC FUNCTION PANEL  I-STAT TROPOININ, ED  Randolm Idol, ED    Imaging Review Dg Chest 2 View  11/23/2015  CLINICAL DATA:  Chest pain, intoxicated EXAM: CHEST  2 VIEW COMPARISON:  07/11/2015 FINDINGS: The heart size and mediastinal contours are within normal limits. Both lungs are clear. The visualized skeletal structures are unremarkable. IMPRESSION: No active cardiopulmonary disease.  Electronically Signed   By: Kathreen Devoid   On: 11/23/2015 23:00   I have personally reviewed and evaluated these images and lab results as part of my medical decision-making.   EKG Interpretation   Date/Time:  Tuesday November 24 2015 01:22:52 EST Ventricular Rate:  104 PR Interval:  134 QRS Duration: 102 QT Interval:  370 QTC Calculation: 487 R Axis:   42 Text Interpretation:  Sinus tachycardia Ventricular premature complex  Borderline low voltage, extremity leads Borderline repolarization  abnormality Borderline prolonged QT interval Baseline wander in lead(s) V5  V6 No significant change since last tracing Confirmed by Madhuri Vacca  [id number]) on 11/24/2015 1:34:18 AM      MDM  Patient seen and evaluated in stable condition.  Chest pain resolved.  Trop and EKG x2 unremarkable.  ETOH elevated but patient able to hold a conversation and answer questions.  No recurrence of chest pain in the ED.  Patient well appearing and medically cleared but continues  to state that he is having auditory hallucinations telling him to kill/hurt someone else or himself.  TTS consult placed.  Psych hold orders placed.  Patient updated on results and plan of care. Final diagnoses:  None    1. Auditory hallucinations  2. Chest pain    Harvel Quale, MD 11/24/15 [phone]

## 2015-11-24 NOTE — ED Notes (Signed)
Pt ambulated to restroom without difficulty. Steady gait

## 2015-11-24 NOTE — Discharge Instructions (Signed)
Alcohol Intoxication  Alcohol intoxication occurs when the amount of alcohol that a person has consumed impairs his or her ability to mentally and physically function. Alcohol directly impairs the normal chemical activity of the brain. Drinking large amounts of alcohol can lead to changes in mental function and behavior, and it can cause many physical effects that can be harmful.   Alcohol intoxication can range in severity from mild to very severe. Various factors can affect the level of intoxication that occurs, such as the person's age, gender, weight, frequency of alcohol consumption, and the presence of other medical conditions (such as diabetes, seizures, or heart conditions). Dangerous levels of alcohol intoxication may occur when people drink large amounts of alcohol in a short period (binge drinking). Alcohol can also be especially dangerous when combined with certain prescription medicines or "recreational" drugs.  SIGNS AND SYMPTOMS  Some common signs and symptoms of mild alcohol intoxication include:  · Loss of coordination.  · Changes in mood and behavior.  · Impaired judgment.  · Slurred speech.  As alcohol intoxication progresses to more severe levels, other signs and symptoms will appear. These may include:  · Vomiting.  · Confusion and impaired memory.  · Slowed breathing.  · Seizures.  · Loss of consciousness.  DIAGNOSIS   Your health care provider will take a medical history and perform a physical exam. You will be asked about the amount and type of alcohol you have consumed. Blood tests will be done to measure the concentration of alcohol in your blood. In many places, your blood alcohol level must be lower than 80 mg/dL (0.08%) to legally drive. However, many dangerous effects of alcohol can occur at much lower levels.   TREATMENT   People with alcohol intoxication often do not require treatment. Most of the effects of alcohol intoxication are temporary, and they go away as the alcohol naturally  leaves the body. Your health care provider will monitor your condition until you are stable enough to go home. Fluids are sometimes given through an IV access tube to help prevent dehydration.   HOME CARE INSTRUCTIONS  · Do not drive after drinking alcohol.  · Stay hydrated. Drink enough water and fluids to keep your urine clear or pale yellow. Avoid caffeine.    · Only take over-the-counter or prescription medicines as directed by your health care provider.    SEEK MEDICAL CARE IF:   · You have persistent vomiting.    · You do not feel better after a few days.  · You have frequent alcohol intoxication. Your health care provider can help determine if you should see a substance use treatment counselor.  SEEK IMMEDIATE MEDICAL CARE IF:   · You become shaky or tremble when you try to stop drinking.    · You shake uncontrollably (seizure).    · You throw up (vomit) blood. This may be bright red or may look like black coffee grounds.    · You have blood in your stool. This may be bright red or may appear as a black, tarry, bad smelling stool.    · You become lightheaded or faint.    MAKE SURE YOU:   · Understand these instructions.  · Will watch your condition.  · Will get help right away if you are not doing well or get worse.     This information is not intended to replace advice given to you by your health care provider. Make sure you discuss any questions you have with your health care provider.       Document Released: 09/21/2005 Document Revised: 08/14/2013 Document Reviewed: 05/17/2013  Elsevier Interactive Patient Education ©2016 [employer] Inc[employer]

## 2015-11-24 NOTE — ED Notes (Addendum)
Pt asking for RN to get Telepsych completed quickly d/t he has called a facility in [place] who will let him come there. Denies SI/HI - states "I didn't ever say I was suicidal." States was intoxicated. Advised pt will advise [facility] Counselor.

## 2015-11-24 NOTE — ED Notes (Signed)
Sitter called for pt. Per MD pt reported that he was suicidal and homicidal.

## 2015-11-24 NOTE — ED Provider Notes (Signed)
Patient cleared for discharge by psychiatry. No suicidal or homicidal thoughts.  BP 126/83 mmHg  Pulse 91  Temp(Src) 98 F (36.7 C) (Axillary)  Resp 18  SpO2 98%   Ezequiel Essex, MD 11/24/15 1411

## 2015-11-24 NOTE — ED Notes (Signed)
Pt wanded by [id number]  [id number];
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
## 2015-11-24 NOTE — ED Notes (Signed)
Pt aware [facility] Counselor aware of his request.

## 2015-11-24 NOTE — ED Notes (Signed)
Patient was picked up from [facility] at [facility] by [facility]. Patient is complaining of generalized weakness with ETOH. Pt was at [facility] last night for the same symptoms.

## 2015-11-25 NOTE — ED Provider Notes (Signed)
CSN: [id number]     Arrival date & time 11/24/15  2309 History  By signing my name below, I, Starleen Arms, attest that this documentation has been prepared under the direction and in the presence of Junius Creamer, NP. Electronically Signed: Starleen Arms ED Scribe. 11/25/2015. 1:07 AM.    Chief Complaint  Patient presents with  . Generalized Body Aches   The history is provided by the patient. No language interpreter was used.   HPI Comments: Darrell [name] is a 54 y.o. male with hx of alcohol abuse, heroin abuse, anxiety, MI, TB, hepatitis C who presents to the Emergency Department requesting alcohol abuse treatment for a 33 year hx of alcohol abuse.  The patient states "I want to go to [facility] for alcoholism."  Per patient, he has not made attempts to independently reach out to treatment centers.  The patient's most recent in-patient addiction treatment was in 2011.  The patient reports he has intermittently attended AA in the past with short periods of sobriety.     Past Medical History  Diagnosis Date  . Hepatitis C   . Tuberculosis   . Myocardial infarction ([place])   . Anxiety   . Chronic pain   . Alcohol abuse   . History of heroin abuse    Past Surgical History  Procedure Laterality Date  . Coronary stent placement     Family History  Problem Relation Age of Onset  . Heart attack      paternal uncle died MI 87   . Cancer      lung-maternal GM  . Cystic fibrosis      mom. died with pna  . Alcohol abuse      brother   Social History  Substance Use Topics  . Smoking status: Current Every Day Smoker -- 0.50 packs/day    Types: Cigarettes  . Smokeless tobacco: Never Used     Comment: trying to quit  . Alcohol Use: Yes     Comment: Daily. Last drink: 1 hour ago    Review of Systems  Cardiovascular: Negative for chest pain.  Gastrointestinal: Negative for abdominal pain.  Psychiatric/Behavioral: Negative for suicidal ideas, behavioral problems and agitation.  All other  systems reviewed and are negative.  10 Systems reviewed and all are negative for acute change except as noted in the HPI.   Allergies  Review of patient's allergies indicates no known allergies.  Home Medications   Prior to Admission medications   Medication Sig Start Date End Date Taking? Authorizing Provider  aspirin EC 81 MG EC tablet Take 1 tablet (81 mg total) by mouth daily. 05/08/15  Yes Rushil Sherrye Payor, MD  clopidogrel (PLAVIX) 75 MG tablet Take 75 mg by mouth daily.   Yes Historical Provider, MD  Multiple Vitamin (MULTIVITAMIN WITH MINERALS) TABS tablet Take 1 tablet by mouth daily.   Yes Historical Provider, MD  amLODipine (NORVASC) 5 MG tablet Take 1 tablet (5 mg total) by mouth daily. Patient not taking: Reported on 05/24/2015 05/08/15   Riccardo Dubin, MD  nitroGLYCERIN (NITROSTAT) 0.4 MG SL tablet Place 1 tablet (0.4 mg total) under the tongue every 5 (five) minutes as needed for chest pain. Patient not taking: Reported on 11/24/2015 05/08/15   Rushil Sherrye Payor, MD   BP 117/81 mmHg  Pulse 117  Temp(Src) 98.1 F (36.7 C) (Oral)  Resp 20  Ht 6' (1.829 m)  Wt 83.008 kg  BMI 24.81 kg/m2  SpO2 100% Physical Exam  Constitutional:  He is oriented to person, place, and time. He appears well-developed and well-nourished. No distress.  HENT:  Head: Normocephalic and atraumatic.  Eyes: Conjunctivae and EOM are normal.  Neck: Normal range of motion. Neck supple.  Cardiovascular: Normal rate.   Pulmonary/Chest: Effort normal. No respiratory distress.  Musculoskeletal: Normal range of motion.  Neurological: He is alert and oriented to person, place, and time.  Skin: Skin is warm and dry.  Psychiatric: He has a normal mood and affect. His behavior is normal.  Nursing note and vitals reviewed.   ED Course  Procedures (including critical care time)  DIAGNOSTIC STUDIES: Oxygen Saturation is 100% on RA, normal by my interpretation.    COORDINATION OF CARE:  1:00 AM Will provide  patient addiction treatment resources.  Patient acknowledges and agrees with plan.    Labs Review Labs Reviewed - No data to display  Imaging Review Dg Chest 2 View  11/23/2015  CLINICAL DATA:  Chest pain, intoxicated EXAM: CHEST  2 VIEW COMPARISON:  07/11/2015 FINDINGS: The heart size and mediastinal contours are within normal limits. Both lungs are clear. The visualized skeletal structures are unremarkable. IMPRESSION: No active cardiopulmonary disease. Electronically Signed   By: Kathreen Devoid   On: 11/23/2015 23:00   I have personally reviewed and evaluated these images and lab results as part of my medical decision-making.   EKG Interpretation None    patent denies SI/HI states he just wants numbers to call detox centers to get off ETOH  MDM   Final diagnoses:  Alcohol use disorder, severe, dependence ([place])    I personally performed the services described in this documentation, which was scribed in my presence. The recorded information has been reviewed and is accurate.   Junius Creamer, NP [id number]  Delora Fuel, MD [id number] [phone]

## 2015-11-25 NOTE — ED Notes (Signed)
Pt standing while on the phone, refuses to sit down in order for this writer to obtain vital signs. Pt held his finger up to me, as if to say hold on. This Probation officer walked out of room and informed RN.

## 2015-11-25 NOTE — Discharge Instructions (Signed)
Alcohol Use Disorder °Alcohol use disorder is a mental disorder. It is not a one-time incident of heavy drinking. Alcohol use disorder is the excessive and uncontrollable use of alcohol over time that leads to problems with functioning in one or more areas of daily living. People with this disorder risk harming themselves and others when they drink to excess. Alcohol use disorder also can cause other mental disorders, such as mood and anxiety disorders, and serious physical problems. People with alcohol use disorder often misuse other drugs.  °Alcohol use disorder is common and widespread. Some people with this disorder drink alcohol to cope with or escape from negative life events. Others drink to relieve chronic pain or symptoms of mental illness. People with a family history of alcohol use disorder are at higher risk of losing control and using alcohol to excess.  °Drinking too much alcohol can cause injury, accidents, and health problems. One drink can be too much when you are: °· Working. °· Pregnant or breastfeeding. °· Taking medicines. Ask your doctor. °· Driving or planning to drive. °SYMPTOMS  °Signs and symptoms of alcohol use disorder may include the following:  °· Consumption of alcohol in larger amounts or over a longer period of time than intended. °· Multiple unsuccessful attempts to cut down or control alcohol use.   °· A great deal of time spent obtaining alcohol, using alcohol, or recovering from the effects of alcohol (hangover). °· A strong desire or urge to use alcohol (cravings).   °· Continued use of alcohol despite problems at work, school, or home because of alcohol use.   °· Continued use of alcohol despite problems in relationships because of alcohol use. °· Continued use of alcohol in situations when it is physically hazardous, such as driving a car. °· Continued use of alcohol despite awareness of a physical or psychological problem that is likely related to alcohol use. Physical  problems related to alcohol use can involve the brain, heart, liver, stomach, and intestines. Psychological problems related to alcohol use include intoxication, depression, anxiety, psychosis, delirium, and dementia.   °· The need for increased amounts of alcohol to achieve the same desired effect, or a decreased effect from the consumption of the same amount of alcohol (tolerance). °· Withdrawal symptoms upon reducing or stopping alcohol use, or alcohol use to reduce or avoid withdrawal symptoms. Withdrawal symptoms include: °¨ Racing heart. °¨ Hand tremor. °¨ Difficulty sleeping. °¨ Nausea. °¨ Vomiting. °¨ Hallucinations. °¨ Restlessness. °¨ Seizures. °DIAGNOSIS °Alcohol use disorder is diagnosed through an assessment by your health care provider. Your health care provider may start by asking three or four questions to screen for excessive or problematic alcohol use. To confirm a diagnosis of alcohol use disorder, at least two symptoms must be present within a 12-month period. The severity of alcohol use disorder depends on the number of symptoms: °· Mild--two or three. °· Moderate--four or five. °· Severe--six or more. °Your health care provider may perform a physical exam or use results from lab tests to see if you have physical problems resulting from alcohol use. Your health care provider may refer you to a mental health professional for evaluation. °TREATMENT  °Some people with alcohol use disorder are able to reduce their alcohol use to low-risk levels. Some people with alcohol use disorder need to quit drinking alcohol. When necessary, mental health professionals with specialized training in substance use treatment can help. Your health care provider can help you decide how severe your alcohol use disorder is and what type of treatment you need.   The following forms of treatment are available:   Detoxification. Detoxification involves the use of prescription medicines to prevent alcohol withdrawal  symptoms in the first week after quitting. This is important for people with a history of symptoms of withdrawal and for heavy drinkers who are likely to have withdrawal symptoms. Alcohol withdrawal can be dangerous and, in severe cases, cause death. Detoxification is usually provided in a hospital or in-patient substance use treatment facility.  Counseling or talk therapy. Talk therapy is provided by substance use treatment counselors. It addresses the reasons people use alcohol and ways to keep them from drinking again. The goals of talk therapy are to help people with alcohol use disorder find healthy activities and ways to cope with life stress, to identify and avoid triggers for alcohol use, and to handle cravings, which can cause relapse.  Medicines.Different medicines can help treat alcohol use disorder through the following actions:  Decrease alcohol cravings.  Decrease the positive reward response felt from alcohol use.  Produce an uncomfortable physical reaction when alcohol is used (aversion therapy).  Support groups. Support groups are run by people who have quit drinking. They provide emotional support, advice, and guidance. These forms of treatment are often combined. Some people with alcohol use disorder benefit from intensive combination treatment provided by specialized substance use treatment centers. Both inpatient and outpatient treatment programs are available.   This information is not intended to replace advice given to you by your health care provider. Make sure you discuss any questions you have with your health care provider.   Document Released: 01/19/2005 Document Revised: 01/02/2015 Document Reviewed: 03/21/2013 Elsevier Interactive Patient Education 2016 [employer].  Finding Treatment for Addiction WHAT IS ADDICTION? Addiction is a complex disease of the brain. It causes an uncontrollable (compulsive) need for a substance. You can be addicted to alcohol,  illegal drugs, or prescription medicines such as painkillers. Addiction can also be a behavior, like gambling or shopping. The need for the drug or activity can become so strong that you think about it all the time. You can also become physically dependent on a substance. Addiction can change the way your brain works. Because of these changes, getting more of whatever you are addicted to becomes the most important thing to you and feels better than other activities or relationships. Addiction can lead to changes in health, behavior, emotions, relationships, and choices that affect you and everyone around you. HOW DO I KNOW IF I NEED TREATMENT FOR ADDICTION? Addiction is a progressive disease. Without treatment, addiction can get worse. Living with addiction puts you at higher risk for injury, poor health, lost employment, loss of money, and even death. You might need treatment for addiction if:  You have tried to stop or cut down, but you cannot.  Your addiction is causing physical health problems.  You find it annoying that your friends and family are concerned about your alcohol or substance use.  You feel guilty about substance abuse or a compulsive behavior.  You have lied or tried to hide your addiction.  You need a particular substance or activity to start your day or to calm down.  You are getting in trouble at school, work, home, or with the police.  You have done something illegal to support your addiction.  You are running out of money because of your addiction.  You have no time for anything other than your addiction. WHAT TYPES OF TREATMENT ARE AVAILABLE? The treatment program that is right for you  will depend on many factors, including the type of addiction you have. Treatment programs can be outpatient or inpatient. In an outpatient program, you live at home and go to work or school, but you also go to a clinic for treatment. With an inpatient program, you live and sleep at  the program facility during treatment. After treatment, you might need a plan for support during recovery. Other treatment options include:   Medicine.  Some addictions may be treated with prescription medicines.  You might also need medicine to treat anxiety or depression.  Counseling and behavior therapy. Therapy can help individuals and families behave in healthier ways and relate more effectively.  Support groups. Confidential group therapy, such as a 12-step program, can help individuals and families during treatment and recovery. No single type of program is right for everyone. Many treatment programs involve a combination of education, counseling, and a 12-step, spiritually-based approach. Some treatment programs are government sponsored. They are geared for patients who do not have private insurance. Treatment programs can vary in many respects, such as:  Cost and types of insurance that are accepted.  Types of on-site medical services that are offered.  Length of stay, setting, and size.  Overall philosophy of treatment. WHAT SHOULD I CONSIDER WHEN SELECTING A TREATMENT PROGRAM? It is important to think about your individual requirements when selecting a treatment program. There are a number of things to consider, such as:  If the program is certified by the appropriate government agency. Even private programs must be certified and employ certified professionals.  If the program is covered by your insurance. If finances are a concern, the first call you should make is to your insurance company, if you have health insurance. Ask for a list of treatment programs that are in your network, and confirm any copayments and deductibles that you may have to pay.  If you do not have insurance, or if you choose to attend a program that does not accept your insurance, discuss whether a payment plan can be set up.  If treatment is available in languages other than English, if needed.  If  the program offers detoxification treatment, if needed.  If 12-step meetings are held at the center or if transport is available for patients to attend meetings at other locations.  If the program is professional, organized, and clean.  If the program meets all of your needs, including physical and cultural needs.  If the facility offers specific treatment for your particular addiction.  If support continues to be offered after you have left the program.  If your treatment plan is continually looked at to make sure you are receiving the right treatment at the right time.  If mental health counseling is part of your treatment.  If medicine is included in treatment, if needed.  If your family is included in your treatment plan and if support is offered to them throughout the treatment process.  How the treatment works to prevent relapse. WHERE ELSE CAN I GET HELP?  Your health care provider. Ask him or her to help you find addiction treatment. These discussions are confidential.  The [employer] on Alcoholism and Drug Dependence (NCADD). This group has information about treatment centers and programs for people who have an addiction and for family members.  The telephone number is 1-800-NCA-CALL ((626) 217-1022).  The website is https://ncadd.org/about-ncadd/our-affiliates  The Substance Abuse and Mental Health Services Administration [facility]). This group will help you find publicly funded treatment centers, help hotlines,  and counseling services near you.  The telephone number is 1-800-662-HELP 605-831-2251).  The website is www.findtreatment.SamedayNews.com.cy In countries outside of the [place] and [place], look in [employer] for contact information for services in your area.   This information is not intended to replace advice given to you by your health care provider. Make sure you discuss any questions you have with your health care provider.   Document Released:  11/10/2005 Document Revised: 09/02/2015 Document Reviewed: 09/30/2014 Elsevier Interactive Patient Education 2016 [employer].  Emergency Department Resource Guide 1) Find a Doctor and Pay Out of Pocket Although you won't have to find out who is covered by your insurance plan, it is a good idea to ask around and get recommendations. You will then need to call the office and see if the doctor you have chosen will accept you as a new patient and what types of options they offer for patients who are self-pay. Some doctors offer discounts or will set up payment plans for their patients who do not have insurance, but you will need to ask so you aren't surprised when you get to your appointment.  2) Contact Your Local Health Department Not all health departments have doctors that can see patients for sick visits, but many do, so it is worth a call to see if yours does. If you don't know where your local health department is, you can check in your phone book. The CDC also has a tool to help you locate your state's health department, and many state websites also have listings of all of their local health departments.  3) Find a [facility] If your illness is not likely to be very severe or complicated, you may want to try a walk in clinic. These are popping up all over the country in pharmacies, drugstores, and shopping centers. They're usually staffed by nurse practitioners or physician assistants that have been trained to treat common illnesses and complaints. They're usually fairly quick and inexpensive. However, if you have serious medical issues or chronic medical problems, these are probably not your best option.  No Primary Care Doctor: - Call Health Connect at  [phone] - they can help you locate a primary care doctor that  accepts your insurance, provides certain services, etc. - Physician Referral Service- [phone]  Chronic Pain Problems: Organization         Address  Phone    Notes  [facility]  [phone] Patients need to be referred by their primary care doctor.   Medication Assistance: Organization         Address  Phone   Notes  [facility] Medication [facility] [address] [place], [place] [address] [phone] --Must be a resident of [facility] -- Must have NO insurance coverage whatsoever (no Medicaid/ Medicare, etc.) -- The pt. MUST have a primary care doctor that directs their care regularly and follows them in the community   [facility]ist  [phone]   [employer]  [phone]    Agencies that provide inexpensive medical care: Organization         Address  Phone   Notes  [place]  [phone]   [facility]    [phone]   [facility] [place], [place] [address] [phone]   [facility] [address] [place] [phone]   Planned Parenthood    [phone]  [facility]    [phone]   [facility]  [address], [place] Phone:  [phone], Fax:  [phone] Hours of Operation:  9 am - 6 pm, M-F.  Also accepts Medicaid/Medicare and self-pay.  [facility] for [place], [address], [place] Phone: [phone], Fax: [phone]. Hours of Operation:  8:30 am - 5:30 pm, M-F.  Also accepts Medicaid and self-pay.  [facility] [address] [place] Phone: [phone]   [place], [place], [place] [phone], Ext. [phone] Mondays & Thursdays: 7-9 AM.  First 15 patients are seen on a first come, first serve basis.    [place] Providers:  Organization         Address  Phone   Notes  [facility] [address], [place] [phone] Also accepts self-pay patients.  [facility]  [id number] [place], [place]  [phone]   [place], [address], [place] [phone]   [facility] [address], [place] [phone]   Lucianne Lei [address], [place]   [phone] Only accepts [place] Access [place] patients after they have their name applied to their card.   Self-Pay (no insurance) in [facility]:  Organization         Address  Phone   Notes  Sickle Cell Patients, [facility] [place] [phone]   [facility] Urgent Care [place] [phone]   [facility] [place]  [place], [place], [place] [phone]   [facility]/Dr. Osei-Bonsu  [address] [place] or [address], [place] [phone] Phone number for both [place] and [place] locations is the same.  Urgent Medical and [facility] [place] [address] [place] [phone]   [facility] [address] [place] or [address] [phone]   [facility] [address], [place] [phone], phone; [phone], fax Sees patients 1st and 3rd Saturday of every month.  Must not qualify for public or private insurance (i.e. Medicaid, Medicare, [place] Health Choice, Veterans' Benefits)  Household income should be no more than 200% of the poverty level The clinic cannot treat you if you are pregnant or think you are pregnant  Sexually transmitted diseases are not treated at the clinic.    Dental Care: Organization         Address  Phone  Notes  [facility] Department of [facility] [place] [phone] Accepts children up to age 37 who are enrolled in [place] or [place]; pregnant women with a Medicaid card; and children who have  applied for Medicaid or [place] Health Choice, but were declined, whose parents can pay a reduced fee at time of service.  [facility] Department of [facility]  [address], [place] [phone] Accepts children up to age 60 who are enrolled in [place] or [place]; pregnant women with a Medicaid card; and children who have applied for Medicaid or  Health Choice, but were declined, whose parents can pay a reduced fee at time  of service.  [facility] PRO[facility]  [place] [phone] Patients are seen by appointment only. Walk-ins are not accepted. [place] will see patients 54 years of age and older. Monday - Tuesday (8am-5pm) Most Wednesdays (8:30-5pm) $30 per visit, cash only  [facility] Adult Dental Access PROGRAM  [address], [facility] [phone] Patients are seen by appointment only. Walk-ins are not accepted. [place] will see patients 41 years of age and older. One Wednesday Evening (Monthly: Volunteer Based).  $30 per visit, cash only  [place]  [phone] for adults; Children under age 28, call Graduate Pediatric Dentistry at [phone]. Children aged 34-14, please call [phone] to request a pediatric application.  Dental services are provided in all areas of dental care including fillings, crowns and bridges, complete and partial dentures, implants, gum treatment, root canals, and extractions. Preventive care is also provided. Treatment is provided to both adults and children. Patients are selected via a lottery and there is often a waiting list.   [facility] [address] [place]  [phone] [email or web address]   [facility] [address] [place], [place] [phone], Ext. [phone] Second and Fourth Thursday of each month, opens at 6:30 AM; Clinic ends at 9 AM.  Patients are seen on a first-come first-served basis, and a  limited number are seen during each clinic.   [facility]  [address] Hillard Danker [place], [place] [phone]   Eligibility Requirements You must have lived in [place], [place], or [place] counties for at least the last three months.   You cannot be eligible for state or federal sponsored [employer], including [employer], [place], or [employer].   You generally cannot be eligible for healthcare insurance through your employer.    How to apply: Eligibility screenings are held every Tuesday and Wednesday afternoon from 1:00 pm until 4:00 pm. You do not need an appointment for the interview!  [facility] [address] [place], [place]   [place] Department  Register  [phone]    Behavioral Health Resources in the Community: Intensive Outpatient Programs Organization         Address  Phone  Notes  [place]. [address] [place], [place] [phone]   [facility] Outpatient [address], [place], [place]   ADS: [facility] [address], [place], [place]   [place] [address],  [place], [place] or [phone]   Substance Abuse Resources Organization         Address  Phone  Notes  Alcohol and Drug Services  [phone]   [place]  [phone]   [facility] [place]   Chinita Pester  [phone]   Residential & Outpatient Substance Abuse Program  [phone]   Psychological Services Organization         Address  Phone  Notes  [facility] [place]  [place]  [phone]   [place] [address] [place] [phone] or [phone]    Mobile Crisis Teams Organization          Address  Phone  Notes  [facility], Mobile Crisis Care Unit  [phone]   Assertive Psychotherapeutic  Services  [address] [place], [place]   [facility] [address] [place] [phone]    Self-Help/Support Groups Organization         Address  Phone             Notes  [place]. of [place] - variety of support groups  [place] Call for more information  Narcotics Anonymous (NA), [facility] [address], [employer] [place]  2 meetings at this location   Special educational needs teacher         Address  Phone  Notes  [facility] [place],    [place]  [phone]   [facility]  [address], [place] [id number], [place], [place]   [place], [place] [phone] Admissions: 8am-3pm M-F  Incentives Substance [place] [address]    [place], [place] [phone]   [facility] [address] [place], [place], [place]   [facility] [address]  [place], [place]   [facility] - Intensive Outpatient [address] Kristeen [address], [place], [place]   [facility] ([place] [address]  [place], [place] [phone] or [phone]   [facility] ([facility]) [address] [place], [place] Accepts Medicaid  [facility] [place] [address],  [place] [phone] Substance Abuse/Addiction Treatment   [facility] Organization         Address  Phone  Notes  [facility]  [phone]   Domenic Schwab, PhD [address] [place], [place]   [phone] or [phone]   [place], [place] [phone]   [facility] [address] [place], [place] [phone] Insurance/Medicaid/sponsorship  through [facility] and Families [address], [address] [place]                                    [place], [place] [phone] [place] [address] [place], [place] [phone]    Dr. Adele Schilder  [phone]   [facility] Dept. 1) [address] [place] 2) [place] 3)  [place] [address], [place] [phone]  [phone]   [place] [phone] or [phone] (After Hours)
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
## 2016-07-22 ENCOUNTER — Encounter (HOSPITAL_COMMUNITY): Payer: Self-pay

## 2016-07-22 ENCOUNTER — Emergency Department (HOSPITAL_COMMUNITY)
Admission: EM | Admit: 2016-07-22 | Discharge: 2016-07-22 | Disposition: A | Discharge status: Home / Self Care | Payer: BLUE CROSS/BLUE SHIELD | Attending: Emergency Medicine | Admitting: Emergency Medicine

## 2016-07-22 DIAGNOSIS — F321 Major depressive disorder, single episode, moderate: Secondary | ICD-10-CM | POA: Insufficient documentation

## 2016-07-22 DIAGNOSIS — I252 Old myocardial infarction: Secondary | ICD-10-CM | POA: Insufficient documentation

## 2016-07-22 DIAGNOSIS — I2510 Atherosclerotic heart disease of native coronary artery without angina pectoris: Secondary | ICD-10-CM | POA: Insufficient documentation

## 2016-07-22 DIAGNOSIS — N182 Chronic kidney disease, stage 2 (mild): Secondary | ICD-10-CM | POA: Insufficient documentation

## 2016-07-22 DIAGNOSIS — F1010 Alcohol abuse, uncomplicated: Secondary | ICD-10-CM

## 2016-07-22 DIAGNOSIS — I129 Hypertensive chronic kidney disease with stage 1 through stage 4 chronic kidney disease, or unspecified chronic kidney disease: Secondary | ICD-10-CM | POA: Insufficient documentation

## 2016-07-22 MED ORDER — CHLORDIAZEPOXIDE HCL 25 MG PO CAPS: 50.0000 mg | ORAL_CAPSULE | Freq: Once | ORAL | Status: DC

## 2016-07-22 MED ORDER — GABAPENTIN 300 MG PO CAPS: 600.0000 mg | ORAL_CAPSULE | Freq: Once | ORAL | Status: DC

## 2016-07-22 NOTE — ED Notes (Signed)
Pt left department.  Stated he felt better.
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
## 2016-07-24 ENCOUNTER — Emergency Department (HOSPITAL_COMMUNITY)
Admission: EM | Admit: 2016-07-24 | Discharge: 2016-07-24 | Disposition: A | Discharge status: Other Acute Inpatient Hospital | Payer: BLUE CROSS/BLUE SHIELD | Attending: Emergency Medicine | Admitting: Emergency Medicine

## 2016-07-24 ENCOUNTER — Encounter (HOSPITAL_COMMUNITY): Payer: Self-pay | Admitting: *Deleted

## 2016-07-24 ENCOUNTER — Observation Stay (HOSPITAL_COMMUNITY)
Admission: AD | Admit: 2016-07-24 | Discharge: 2016-07-25 | Disposition: A | Discharge status: Home / Self Care | Payer: BLUE CROSS/BLUE SHIELD | Source: Intra-hospital | Attending: Psychiatry | Admitting: Psychiatry

## 2016-07-24 ENCOUNTER — Encounter (HOSPITAL_COMMUNITY): Payer: Self-pay

## 2016-07-24 DIAGNOSIS — T1491 Suicide attempt: Principal | ICD-10-CM | POA: Insufficient documentation

## 2016-07-24 DIAGNOSIS — F1020 Alcohol dependence, uncomplicated: Secondary | ICD-10-CM

## 2016-07-24 DIAGNOSIS — I252 Old myocardial infarction: Secondary | ICD-10-CM | POA: Insufficient documentation

## 2016-07-24 DIAGNOSIS — Z7982 Long term (current) use of aspirin: Secondary | ICD-10-CM | POA: Insufficient documentation

## 2016-07-24 DIAGNOSIS — Y92239 Unspecified place in hospital as the place of occurrence of the external cause: Secondary | ICD-10-CM | POA: Insufficient documentation

## 2016-07-24 DIAGNOSIS — Y9389 Activity, other specified: Secondary | ICD-10-CM | POA: Insufficient documentation

## 2016-07-24 DIAGNOSIS — F1094 Alcohol use, unspecified with alcohol-induced mood disorder: Secondary | ICD-10-CM | POA: Diagnosis present

## 2016-07-24 DIAGNOSIS — E781 Pure hyperglyceridemia: Secondary | ICD-10-CM | POA: Insufficient documentation

## 2016-07-24 DIAGNOSIS — I129 Hypertensive chronic kidney disease with stage 1 through stage 4 chronic kidney disease, or unspecified chronic kidney disease: Secondary | ICD-10-CM | POA: Insufficient documentation

## 2016-07-24 DIAGNOSIS — F909 Attention-deficit hyperactivity disorder, unspecified type: Secondary | ICD-10-CM | POA: Insufficient documentation

## 2016-07-24 DIAGNOSIS — F32A Depression, unspecified: Secondary | ICD-10-CM

## 2016-07-24 DIAGNOSIS — F329 Major depressive disorder, single episode, unspecified: Secondary | ICD-10-CM | POA: Insufficient documentation

## 2016-07-24 DIAGNOSIS — R45851 Suicidal ideations: Secondary | ICD-10-CM

## 2016-07-24 DIAGNOSIS — B1920 Unspecified viral hepatitis C without hepatic coma: Secondary | ICD-10-CM | POA: Insufficient documentation

## 2016-07-24 DIAGNOSIS — F331 Major depressive disorder, recurrent, moderate: Secondary | ICD-10-CM | POA: Insufficient documentation

## 2016-07-24 DIAGNOSIS — Z811 Family history of alcohol abuse and dependence: Secondary | ICD-10-CM | POA: Insufficient documentation

## 2016-07-24 DIAGNOSIS — I2510 Atherosclerotic heart disease of native coronary artery without angina pectoris: Secondary | ICD-10-CM | POA: Insufficient documentation

## 2016-07-24 DIAGNOSIS — F1024 Alcohol dependence with alcohol-induced mood disorder: Secondary | ICD-10-CM | POA: Insufficient documentation

## 2016-07-24 DIAGNOSIS — F10920 Alcohol use, unspecified with intoxication, uncomplicated: Secondary | ICD-10-CM

## 2016-07-24 DIAGNOSIS — N182 Chronic kidney disease, stage 2 (mild): Secondary | ICD-10-CM | POA: Insufficient documentation

## 2016-07-24 DIAGNOSIS — F419 Anxiety disorder, unspecified: Secondary | ICD-10-CM | POA: Insufficient documentation

## 2016-07-24 DIAGNOSIS — F1010 Alcohol abuse, uncomplicated: Secondary | ICD-10-CM | POA: Insufficient documentation

## 2016-07-24 DIAGNOSIS — F17210 Nicotine dependence, cigarettes, uncomplicated: Secondary | ICD-10-CM | POA: Insufficient documentation

## 2016-07-24 DIAGNOSIS — Z79899 Other long term (current) drug therapy: Secondary | ICD-10-CM | POA: Insufficient documentation

## 2016-07-24 DIAGNOSIS — Y998 Other external cause status: Secondary | ICD-10-CM | POA: Insufficient documentation

## 2016-07-24 DIAGNOSIS — X838XXA Intentional self-harm by other specified means, initial encounter: Secondary | ICD-10-CM | POA: Insufficient documentation

## 2016-07-24 LAB — COMPREHENSIVE METABOLIC PANEL
ALT: 68 U/L — AB (ref 17–63)
ANION GAP: 10 (ref 5–15)
AST: 42 U/L — ABNORMAL HIGH (ref 15–41)
Albumin: 4.4 g/dL (ref 3.5–5.0)
Alkaline Phosphatase: 61 U/L (ref 38–126)
BUN: <5 mg/dL — ABNORMAL LOW (ref 6–20)
CHLORIDE: 106 mmol/L (ref 101–111)
CO2: 23 mmol/L (ref 22–32)
CREATININE: 0.9 mg/dL (ref 0.61–1.24)
Calcium: 8.8 mg/dL — ABNORMAL LOW (ref 8.9–10.3)
Glucose, Bld: 110 mg/dL — ABNORMAL HIGH (ref 65–99)
Potassium: 3.8 mmol/L (ref 3.5–5.1)
Sodium: 139 mmol/L (ref 135–145)
Total Bilirubin: 0.6 mg/dL (ref 0.3–1.2)
Total Protein: 7.6 g/dL (ref 6.5–8.1)

## 2016-07-24 LAB — CBC
HEMATOCRIT: 48.4 % (ref 39.0–52.0)
Hemoglobin: 17.1 g/dL — ABNORMAL HIGH (ref 13.0–17.0)
MCH: 32.3 pg (ref 26.0–34.0)
MCHC: 35.3 g/dL (ref 30.0–36.0)
MCV: 91.3 fL (ref 78.0–100.0)
PLATELETS: 367 10*3/uL (ref 150–400)
RBC: 5.3 MIL/uL (ref 4.22–5.81)
RDW: 13.7 % (ref 11.5–15.5)
WBC: 8.5 10*3/uL (ref 4.0–10.5)

## 2016-07-24 LAB — RAPID URINE DRUG SCREEN, HOSP PERFORMED
Amphetamines: NOT DETECTED
BARBITURATES: NOT DETECTED
BENZODIAZEPINES: NOT DETECTED
COCAINE: NOT DETECTED
Opiates: NOT DETECTED
Tetrahydrocannabinol: POSITIVE — AB

## 2016-07-24 LAB — ETHANOL: ALCOHOL ETHYL (B): 188 mg/dL — AB (ref <5)

## 2016-07-24 MED ORDER — VITAMIN B-1 100 MG PO TABS
100.0000 mg | ORAL_TABLET | Freq: Every day | ORAL | Status: DC
  Administered 2016-07-25: 100 mg via ORAL
  Filled 2016-07-24: qty 1

## 2016-07-24 MED ORDER — LORAZEPAM 1 MG PO TABS: 1.0000 mg | ORAL_TABLET | Freq: Two times a day (BID) | ORAL | Status: DC

## 2016-07-24 MED ORDER — ADULT MULTIVITAMIN W/MINERALS CH
1.0000 | ORAL_TABLET | Freq: Every day | ORAL | Status: DC
  Administered 2016-07-25: 1 via ORAL
  Filled 2016-07-24: qty 3
  Filled 2016-07-24: qty 1

## 2016-07-24 MED ORDER — LORAZEPAM 1 MG PO TABS: 1.0000 mg | ORAL_TABLET | Freq: Four times a day (QID) | ORAL | Status: DC | PRN

## 2016-07-24 MED ORDER — LORAZEPAM 1 MG PO TABS: 1.0000 mg | ORAL_TABLET | Freq: Every day | ORAL | Status: DC

## 2016-07-24 MED ORDER — HYDROXYZINE HCL 25 MG PO TABS: 25.0000 mg | ORAL_TABLET | Freq: Four times a day (QID) | ORAL | Status: DC | PRN

## 2016-07-24 MED ORDER — NITROGLYCERIN 0.4 MG SL SUBL
0.4000 mg | SUBLINGUAL_TABLET | SUBLINGUAL | Status: DC | PRN
  Filled 2016-07-24: qty 25

## 2016-07-24 MED ORDER — LISINOPRIL 20 MG PO TABS
20.0000 mg | ORAL_TABLET | Freq: Every day | ORAL | Status: DC
  Administered 2016-07-24 – 2016-07-25 (×2): 20 mg via ORAL
  Filled 2016-07-24: qty 1
  Filled 2016-07-24: qty 3
  Filled 2016-07-24: qty 1

## 2016-07-24 MED ORDER — IBUPROFEN 200 MG PO TABS
600.0000 mg | ORAL_TABLET | Freq: Once | ORAL | Status: AC
  Administered 2016-07-24: 600 mg via ORAL
  Filled 2016-07-24: qty 3

## 2016-07-24 MED ORDER — NICOTINE 21 MG/24HR TD PT24
21.0000 mg | MEDICATED_PATCH | Freq: Every day | TRANSDERMAL | Status: DC
  Administered 2016-07-24 – 2016-07-25 (×2): 21 mg via TRANSDERMAL
  Filled 2016-07-24 (×2): qty 1

## 2016-07-24 MED ORDER — IBUPROFEN 800 MG PO TABS
800.0000 mg | ORAL_TABLET | Freq: Once | ORAL | Status: AC
  Administered 2016-07-24: 800 mg via ORAL
  Filled 2016-07-24: qty 1

## 2016-07-24 MED ORDER — LORAZEPAM 1 MG PO TABS: 1.0000 mg | ORAL_TABLET | Freq: Four times a day (QID) | ORAL | Status: DC

## 2016-07-24 MED ORDER — NICOTINE 21 MG/24HR TD PT24: 21.0000 mg | MEDICATED_PATCH | Freq: Every day | TRANSDERMAL | Status: DC

## 2016-07-24 MED ORDER — LOPERAMIDE HCL 2 MG PO CAPS: 2.0000 mg | ORAL_CAPSULE | ORAL | Status: DC | PRN

## 2016-07-24 MED ORDER — LORAZEPAM 1 MG PO TABS
2.0000 mg | ORAL_TABLET | Freq: Once | ORAL | Status: AC
  Administered 2016-07-24: 2 mg via ORAL
  Filled 2016-07-24: qty 2

## 2016-07-24 MED ORDER — LORAZEPAM 1 MG PO TABS
1.0000 mg | ORAL_TABLET | Freq: Four times a day (QID) | ORAL | Status: DC
  Administered 2016-07-24 – 2016-07-25 (×3): 1 mg via ORAL
  Filled 2016-07-24 (×3): qty 1

## 2016-07-24 MED ORDER — VITAMIN B-1 100 MG PO TABS: 100.0000 mg | ORAL_TABLET | Freq: Every day | ORAL | Status: DC

## 2016-07-24 MED ORDER — ATORVASTATIN CALCIUM 40 MG PO TABS
80.0000 mg | ORAL_TABLET | Freq: Every day | ORAL | Status: DC
  Administered 2016-07-24 – 2016-07-25 (×2): 80 mg via ORAL
  Filled 2016-07-24: qty 6
  Filled 2016-07-24 (×2): qty 2

## 2016-07-24 MED ORDER — MAGNESIUM HYDROXIDE 400 MG/5ML PO SUSP
30.0000 mL | Freq: Every day | ORAL | Status: DC | PRN
Start: 2016-07-24 — End: 2016-07-25

## 2016-07-24 MED ORDER — ONDANSETRON 4 MG PO TBDP: 4.0000 mg | ORAL_TABLET | Freq: Four times a day (QID) | ORAL | Status: DC | PRN

## 2016-07-24 MED ORDER — ADULT MULTIVITAMIN W/MINERALS CH
1.0000 | ORAL_TABLET | Freq: Every day | ORAL | Status: DC
  Administered 2016-07-24: 1 via ORAL
  Filled 2016-07-24: qty 1

## 2016-07-24 MED ORDER — THIAMINE HCL 100 MG/ML IJ SOLN
100.0000 mg | Freq: Once | INTRAMUSCULAR | Status: DC
  Administered 2016-07-24: 100 mg via INTRAMUSCULAR
  Filled 2016-07-24: qty 2

## 2016-07-24 MED ORDER — GABAPENTIN 300 MG PO CAPS
600.0000 mg | ORAL_CAPSULE | Freq: Three times a day (TID) | ORAL | Status: DC
  Administered 2016-07-24 – 2016-07-25 (×3): 600 mg via ORAL
  Filled 2016-07-24 (×3): qty 2
  Filled 2016-07-24: qty 18

## 2016-07-24 MED ORDER — THIAMINE HCL 100 MG/ML IJ SOLN: 100.0000 mg | Freq: Once | INTRAMUSCULAR | Status: DC

## 2016-07-24 MED ORDER — LORAZEPAM 1 MG PO TABS: 1.0000 mg | ORAL_TABLET | Freq: Three times a day (TID) | ORAL | Status: DC

## 2016-07-24 MED ORDER — ACETAMINOPHEN 325 MG PO TABS: 650.0000 mg | ORAL_TABLET | Freq: Four times a day (QID) | ORAL | Status: DC | PRN

## 2016-07-24 MED ORDER — ALUM & MAG HYDROXIDE-SIMETH 200-200-20 MG/5ML PO SUSP: 30.0000 mL | ORAL | Status: DC | PRN

## 2016-07-24 MED ORDER — ASPIRIN EC 81 MG PO TBEC
81.0000 mg | DELAYED_RELEASE_TABLET | Freq: Every day | ORAL | Status: DC
  Administered 2016-07-24 – 2016-07-25 (×2): 81 mg via ORAL
  Filled 2016-07-24: qty 3
  Filled 2016-07-24 (×2): qty 1

## 2016-07-24 NOTE — BH Assessment (Signed)
Assessment completed. Consulted Darlyne Russian, PA-C who recommended inpatient treatment. TTS to seek placement. Informed Dr. Betsey Holiday of the recommendation.
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
## 2016-07-24 NOTE — BHH Counselor (Signed)
Support paperwork signed and faxed to [facility]. [employer] placed in pt's chart. Per Letitia Libra at [facility], pt has been accepted to OBS bed #2.   Dr Hampton Abbot rescinded pt's IVC. Original IVC paperwork placed in [facility] IVC log and copies place in pt's chart.  Arnold Long, [place] Therapeutic Triage Specialist
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
## 2016-07-24 NOTE — ED Notes (Signed)
Patient noted in room talking to TTS with [facility]. No complaints, stable, in no acute distress. Q15 minute rounds and monitoring via [employer] to continue.

## 2016-07-24 NOTE — Progress Notes (Signed)
Patient stated that he relapsed on alcoholism and became suicidal. Patient stated that he resources to get off alcohol and marijuana. Patient has past history of drug abuse (Heroin). Patient relapsed May 28th. Patient has intense eye contact, animated expression, and is fidgety. He stated that he was tired because the Ativan was kicking in.   Patient's safety is maintained through constant monitoring with the exception of restroom times. Patient was offered education, support, and encouragement. Patient was given documented and verbal safety contract.    Patient is receptive and cooperative. Patient is sleeping at this time. Will continue to monitor.

## 2016-07-24 NOTE — ED Notes (Addendum)
Went collect patient's belongings and explain the medical clearance procedures and found patient with belt wrapped around his neck and was laughing about it. Took all patients belongings and belt, Doctor was notified of patient's actions. All cables and wires and cords were removed from patient's room.

## 2016-07-24 NOTE — ED Notes (Signed)
Message left for pehlam

## 2016-07-24 NOTE — ED Triage Notes (Signed)
Pt states that he is an alcoholic and would like to detox from alcohol at this time. States that when he runs out of gabapentin, he gets triggered to drink alcohol. A&Ox4. Ambulatory. Denies SI.

## 2016-07-24 NOTE — ED Notes (Signed)
Eating lunch pt is aware that he will be transferred to [facility]

## 2016-07-24 NOTE — ED Notes (Signed)
Pt ambulatory with out difficulty to [facility] with pehlam, belongings sent with driver.

## 2016-07-24 NOTE — ED Notes (Signed)
Patient noted sleeping in room. No complaints, stable, in no acute distress. Q15 minute rounds and monitoring via [employer] to continue.  

## 2016-07-24 NOTE — ED Notes (Signed)
Pt. Transferred to [facility] from ED to room 38. Report from [facility]. Pt. Oriented to unit including Q15 minute rounds as well as the security cameras for their protection. Patient is alert and oriented, warm and dry in no acute distress. Patient denies HI, and VH. Pt. States he has SI to walk in traffic and hears voices telling him he is a "wing nut". Pt. Encouraged to let me know if needs arise.
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
## 2016-07-24 NOTE — ED Notes (Signed)
MD at bedside. 

## 2016-07-24 NOTE — ED Notes (Signed)
Pt walked out to triage desk to tell staff that he is unable to see and having a headache. This RN did a neuro exam and it was unremarkable. Pt has normal sensation in his arms and legs. Pt has equal sensation in arms and legs. Pt also has normal peripheral vision. EDP notified.

## 2016-07-24 NOTE — Progress Notes (Signed)
Pt Darrell [name] in bed #2 in observation unit is asleep at shift change.  Pt later wakes for vitals and assessment.  Pt is calm, cooperative and answers questions.  Pt sts that he is currently not experiencing any withdrawal symptoms. "When I get ativan I am fine and dont have to go through as bad of withdrawals".  Pt denies SI, HI and AVH.  Pt contracts for safety verbally. Pt safety maintained through constant observation except when in the bathroom.  Pt offered support and encouragement.. Pt remains safe on unit.  Monitoring in effect for safety.

## 2016-07-25 DIAGNOSIS — F1094 Alcohol use, unspecified with alcohol-induced mood disorder: Secondary | ICD-10-CM | POA: Diagnosis not present

## 2016-07-25 DIAGNOSIS — R45851 Suicidal ideations: Secondary | ICD-10-CM

## 2016-07-25 MED ORDER — GABAPENTIN 300 MG PO CAPS: 600.0000 mg | ORAL_CAPSULE | Freq: Three times a day (TID) | ORAL | 0 refills | Status: DC

## 2016-07-25 MED ORDER — LISINOPRIL 20 MG PO TABS: 20.0000 mg | ORAL_TABLET | Freq: Every day | ORAL | 0 refills | Status: DC

## 2016-07-25 MED ORDER — ASPIRIN 81 MG PO TBEC: 81.0000 mg | DELAYED_RELEASE_TABLET | Freq: Every day | ORAL | 0 refills | Status: DC

## 2016-07-25 MED ORDER — ATORVASTATIN CALCIUM 80 MG PO TABS: 80.0000 mg | ORAL_TABLET | Freq: Every day | ORAL | Status: DC

## 2016-07-25 MED ORDER — ADULT MULTIVITAMIN W/MINERALS CH
1.0000 | ORAL_TABLET | Freq: Every day | ORAL | Status: DC
Start: 2016-07-25 — End: 2016-08-04

## 2016-07-25 NOTE — Progress Notes (Signed)
D:  Patient awake, alert, and cooperative; he denies suicidal and homicidal ideation and AVH;  No self-injurious behaviors noted or reported.  He expresses concern about "where I'm going from here" A:  Emotional support provided; encouraged him to seek assistance with needs/concerns. R:  Safety maintained on unit
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
## 2016-07-25 NOTE — Discharge Instructions (Signed)
Follow up with:  [facility] [address] [place], [place] [address] [phone]  [facility] [phone] [address] [employer] [id number]

## 2016-07-25 NOTE — Progress Notes (Addendum)
Written/verbal discharge instructions given to patient with verbalization of understanding; prescriptions x 2 given to patient along with sample medications from pharmacy; pt denies SI/HI/AVH or other concerns; all belongings returned to patient at time of discharge; discharged home in stable condition; two bus passes given; ambulatory upon discharge.

## 2016-07-28 ENCOUNTER — Emergency Department (HOSPITAL_COMMUNITY)
Admission: EM | Admit: 2016-07-28 | Discharge: 2016-07-29 | Disposition: A | Discharge status: Other Acute Inpatient Hospital | Payer: Self-pay | Attending: Emergency Medicine | Admitting: Emergency Medicine

## 2016-07-28 ENCOUNTER — Encounter (HOSPITAL_COMMUNITY): Payer: Self-pay | Admitting: *Deleted

## 2016-07-28 DIAGNOSIS — R45851 Suicidal ideations: Secondary | ICD-10-CM | POA: Insufficient documentation

## 2016-07-28 DIAGNOSIS — F17210 Nicotine dependence, cigarettes, uncomplicated: Secondary | ICD-10-CM | POA: Insufficient documentation

## 2016-07-28 DIAGNOSIS — F32A Depression, unspecified: Secondary | ICD-10-CM

## 2016-07-28 DIAGNOSIS — F329 Major depressive disorder, single episode, unspecified: Secondary | ICD-10-CM | POA: Insufficient documentation

## 2016-07-28 DIAGNOSIS — F1010 Alcohol abuse, uncomplicated: Secondary | ICD-10-CM | POA: Insufficient documentation

## 2016-07-28 DIAGNOSIS — Z955 Presence of coronary angioplasty implant and graft: Secondary | ICD-10-CM | POA: Insufficient documentation

## 2016-07-28 DIAGNOSIS — N182 Chronic kidney disease, stage 2 (mild): Secondary | ICD-10-CM | POA: Insufficient documentation

## 2016-07-28 DIAGNOSIS — Z79899 Other long term (current) drug therapy: Secondary | ICD-10-CM | POA: Insufficient documentation

## 2016-07-28 DIAGNOSIS — I129 Hypertensive chronic kidney disease with stage 1 through stage 4 chronic kidney disease, or unspecified chronic kidney disease: Secondary | ICD-10-CM | POA: Insufficient documentation

## 2016-07-28 DIAGNOSIS — I2510 Atherosclerotic heart disease of native coronary artery without angina pectoris: Secondary | ICD-10-CM | POA: Insufficient documentation

## 2016-07-28 DIAGNOSIS — I252 Old myocardial infarction: Secondary | ICD-10-CM | POA: Insufficient documentation

## 2016-07-28 DIAGNOSIS — Z7982 Long term (current) use of aspirin: Secondary | ICD-10-CM | POA: Insufficient documentation

## 2016-07-28 LAB — COMPREHENSIVE METABOLIC PANEL
ALBUMIN: 4 g/dL (ref 3.5–5.0)
ALT: 64 U/L — ABNORMAL HIGH (ref 17–63)
ANION GAP: 10 (ref 5–15)
AST: 47 U/L — ABNORMAL HIGH (ref 15–41)
Alkaline Phosphatase: 58 U/L (ref 38–126)
BILIRUBIN TOTAL: 0.6 mg/dL (ref 0.3–1.2)
BUN: <5 mg/dL — ABNORMAL LOW (ref 6–20)
CO2: 24 mmol/L (ref 22–32)
Calcium: 8.6 mg/dL — ABNORMAL LOW (ref 8.9–10.3)
Chloride: 103 mmol/L (ref 101–111)
Creatinine, Ser: 1.02 mg/dL (ref 0.61–1.24)
GFR calc non Af Amer: >60 mL/min (ref >60)
GLUCOSE: 115 mg/dL — AB (ref 65–99)
POTASSIUM: 3.7 mmol/L (ref 3.5–5.1)
Sodium: 137 mmol/L (ref 135–145)
TOTAL PROTEIN: 6.7 g/dL (ref 6.5–8.1)

## 2016-07-28 LAB — CBC
HEMATOCRIT: 50 % (ref 39.0–52.0)
Hemoglobin: 16.9 g/dL (ref 13.0–17.0)
MCH: 31.2 pg (ref 26.0–34.0)
MCHC: 33.8 g/dL (ref 30.0–36.0)
MCV: 92.3 fL (ref 78.0–100.0)
PLATELETS: 301 10*3/uL (ref 150–400)
RBC: 5.42 MIL/uL (ref 4.22–5.81)
RDW: 13.5 % (ref 11.5–15.5)
WBC: 7.7 10*3/uL (ref 4.0–10.5)

## 2016-07-28 LAB — ACETAMINOPHEN LEVEL

## 2016-07-28 LAB — ETHANOL: ALCOHOL ETHYL (B): 168 mg/dL — AB (ref <5)

## 2016-07-28 LAB — RAPID URINE DRUG SCREEN, HOSP PERFORMED
Amphetamines: NOT DETECTED
BENZODIAZEPINES: NOT DETECTED
Barbiturates: NOT DETECTED
COCAINE: NOT DETECTED
OPIATES: NOT DETECTED
Tetrahydrocannabinol: NOT DETECTED

## 2016-07-28 LAB — SALICYLATE LEVEL

## 2016-07-28 MED ORDER — LISINOPRIL 20 MG PO TABS: 20.0000 mg | ORAL_TABLET | Freq: Every day | ORAL | Status: DC

## 2016-07-28 MED ORDER — IBUPROFEN 800 MG PO TABS
800.0000 mg | ORAL_TABLET | Freq: Once | ORAL | Status: AC
Start: 2016-07-28 — End: 2016-07-28
  Administered 2016-07-28: 800 mg via ORAL
  Filled 2016-07-28: qty 1

## 2016-07-28 MED ORDER — ACETAMINOPHEN 325 MG PO TABS: 650.0000 mg | ORAL_TABLET | Freq: Once | ORAL | Status: DC

## 2016-07-28 MED ORDER — ADULT MULTIVITAMIN W/MINERALS CH
1.0000 | ORAL_TABLET | Freq: Every day | ORAL | Status: DC
  Filled 2016-07-28: qty 1

## 2016-07-28 MED ORDER — IBUPROFEN 800 MG PO TABS: 800.0000 mg | ORAL_TABLET | Freq: Once | ORAL | Status: DC

## 2016-07-28 MED ORDER — NITROGLYCERIN 0.4 MG SL SUBL: 0.4000 mg | SUBLINGUAL_TABLET | SUBLINGUAL | Status: DC | PRN

## 2016-07-28 MED ORDER — ATORVASTATIN CALCIUM 80 MG PO TABS
80.0000 mg | ORAL_TABLET | Freq: Every day | ORAL | Status: DC
  Administered 2016-07-28: 80 mg via ORAL
  Filled 2016-07-28: qty 1

## 2016-07-28 MED ORDER — LORAZEPAM 2 MG/ML IJ SOLN: 0.0000 mg | Freq: Two times a day (BID) | INTRAMUSCULAR | Status: DC

## 2016-07-28 MED ORDER — LORAZEPAM 2 MG/ML IJ SOLN: 0.0000 mg | Freq: Four times a day (QID) | INTRAMUSCULAR | Status: DC

## 2016-07-28 MED ORDER — ASPIRIN EC 81 MG PO TBEC: 81.0000 mg | DELAYED_RELEASE_TABLET | Freq: Every day | ORAL | Status: DC

## 2016-07-28 MED ORDER — GABAPENTIN 300 MG PO CAPS
600.0000 mg | ORAL_CAPSULE | Freq: Three times a day (TID) | ORAL | Status: DC
  Administered 2016-07-28: 600 mg via ORAL
  Filled 2016-07-28: qty 2

## 2016-07-28 NOTE — ED Triage Notes (Addendum)
Pt reports alcohol abuse, last drank pta. Reports feeling depressed and having thoughts of suicide. Denies any attempts. Is calm and cooperative at triage. Pt reports having hep C and "just feels run down." pt feels better when taking his gabapentin, was seen at [facility] and given prescription for 3 days worth but has already taken all of them.
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
## 2016-07-28 NOTE — ED Notes (Signed)
Called staffing, sitter will be here at 7pm.
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
## 2016-07-28 NOTE — ED Notes (Signed)
Pt in scrubs, belongings removed, called for security to wand pt and called for sitter.

## 2016-07-29 ENCOUNTER — Encounter (HOSPITAL_COMMUNITY): Payer: Self-pay

## 2016-07-29 ENCOUNTER — Inpatient Hospital Stay (HOSPITAL_COMMUNITY)
Admission: AD | Admit: 2016-07-29 | Discharge: 2016-08-04 | DRG: 885 | Disposition: A | Discharge status: Home / Self Care | Payer: BLUE CROSS/BLUE SHIELD | Source: Intra-hospital | Attending: Psychiatry | Admitting: Psychiatry

## 2016-07-29 DIAGNOSIS — F10939 Alcohol use, unspecified with withdrawal, unspecified: Secondary | ICD-10-CM | POA: Diagnosis present

## 2016-07-29 DIAGNOSIS — F10239 Alcohol dependence with withdrawal, unspecified: Secondary | ICD-10-CM | POA: Diagnosis present

## 2016-07-29 DIAGNOSIS — Y906 Blood alcohol level of 120-199 mg/100 ml: Secondary | ICD-10-CM | POA: Diagnosis present

## 2016-07-29 DIAGNOSIS — Z590 Homelessness: Secondary | ICD-10-CM

## 2016-07-29 DIAGNOSIS — I252 Old myocardial infarction: Secondary | ICD-10-CM

## 2016-07-29 DIAGNOSIS — Z955 Presence of coronary angioplasty implant and graft: Secondary | ICD-10-CM

## 2016-07-29 DIAGNOSIS — R45851 Suicidal ideations: Secondary | ICD-10-CM | POA: Diagnosis present

## 2016-07-29 DIAGNOSIS — F17210 Nicotine dependence, cigarettes, uncomplicated: Secondary | ICD-10-CM | POA: Diagnosis present

## 2016-07-29 DIAGNOSIS — G4700 Insomnia, unspecified: Secondary | ICD-10-CM | POA: Diagnosis present

## 2016-07-29 DIAGNOSIS — F1094 Alcohol use, unspecified with alcohol-induced mood disorder: Secondary | ICD-10-CM | POA: Diagnosis present

## 2016-07-29 DIAGNOSIS — F332 Major depressive disorder, recurrent severe without psychotic features: Principal | ICD-10-CM | POA: Diagnosis present

## 2016-07-29 MED ORDER — GABAPENTIN 300 MG PO CAPS
600.0000 mg | ORAL_CAPSULE | Freq: Three times a day (TID) | ORAL | Status: DC
  Filled 2016-07-29: qty 2

## 2016-07-29 MED ORDER — MAGNESIUM HYDROXIDE 400 MG/5ML PO SUSP: 30.0000 mL | Freq: Every day | ORAL | Status: DC | PRN

## 2016-07-29 MED ORDER — LORAZEPAM 1 MG PO TABS
1.0000 mg | ORAL_TABLET | Freq: Every day | ORAL | Status: AC
  Administered 2016-08-02: 1 mg via ORAL
  Filled 2016-07-29: qty 1

## 2016-07-29 MED ORDER — LORAZEPAM 1 MG PO TABS
1.0000 mg | ORAL_TABLET | Freq: Four times a day (QID) | ORAL | Status: AC | PRN
  Administered 2016-07-30 – 2016-07-31 (×2): 1 mg via ORAL
  Filled 2016-07-29 (×2): qty 1

## 2016-07-29 MED ORDER — ACETAMINOPHEN 325 MG PO TABS: 650.0000 mg | ORAL_TABLET | Freq: Four times a day (QID) | ORAL | Status: DC | PRN

## 2016-07-29 MED ORDER — LORAZEPAM 1 MG PO TABS
1.0000 mg | ORAL_TABLET | Freq: Three times a day (TID) | ORAL | Status: AC
  Administered 2016-07-30 – 2016-07-31 (×3): 1 mg via ORAL
  Filled 2016-07-29 (×3): qty 1

## 2016-07-29 MED ORDER — GABAPENTIN 300 MG PO CAPS
300.0000 mg | ORAL_CAPSULE | Freq: Every day | ORAL | Status: DC
  Administered 2016-07-29: 300 mg via ORAL
  Filled 2016-07-29 (×2): qty 1

## 2016-07-29 MED ORDER — LOPERAMIDE HCL 2 MG PO CAPS: 2.0000 mg | ORAL_CAPSULE | ORAL | Status: AC | PRN

## 2016-07-29 MED ORDER — ONDANSETRON 4 MG PO TBDP: 4.0000 mg | ORAL_TABLET | Freq: Four times a day (QID) | ORAL | Status: AC | PRN

## 2016-07-29 MED ORDER — VITAMIN B-1 100 MG PO TABS
100.0000 mg | ORAL_TABLET | Freq: Every day | ORAL | Status: DC
  Administered 2016-07-29 – 2016-08-04 (×7): 100 mg via ORAL
  Filled 2016-07-29 (×9): qty 1

## 2016-07-29 MED ORDER — IBUPROFEN 800 MG PO TABS
800.0000 mg | ORAL_TABLET | Freq: Three times a day (TID) | ORAL | Status: DC | PRN
  Administered 2016-07-29 – 2016-08-03 (×5): 800 mg via ORAL
  Filled 2016-07-29 (×5): qty 1

## 2016-07-29 MED ORDER — LORAZEPAM 1 MG PO TABS: 2.0000 mg | ORAL_TABLET | Freq: Once | ORAL | Status: DC

## 2016-07-29 MED ORDER — LORAZEPAM 1 MG PO TABS
2.0000 mg | ORAL_TABLET | Freq: Once | ORAL | Status: AC
  Administered 2016-07-29: 2 mg via ORAL
  Filled 2016-07-29: qty 2

## 2016-07-29 MED ORDER — LORAZEPAM 1 MG PO TABS
1.0000 mg | ORAL_TABLET | Freq: Four times a day (QID) | ORAL | Status: AC
  Administered 2016-07-29 – 2016-07-30 (×3): 1 mg via ORAL
  Filled 2016-07-29 (×3): qty 1

## 2016-07-29 MED ORDER — LORAZEPAM 1 MG PO TABS
1.0000 mg | ORAL_TABLET | Freq: Two times a day (BID) | ORAL | Status: AC
  Administered 2016-07-31 – 2016-08-01 (×2): 1 mg via ORAL
  Filled 2016-07-29 (×2): qty 1

## 2016-07-29 MED ORDER — ALUM & MAG HYDROXIDE-SIMETH 200-200-20 MG/5ML PO SUSP: 30.0000 mL | ORAL | Status: DC | PRN

## 2016-07-29 MED ORDER — GABAPENTIN 300 MG PO CAPS
600.0000 mg | ORAL_CAPSULE | Freq: Three times a day (TID) | ORAL | Status: DC
  Administered 2016-07-30 – 2016-08-02 (×10): 600 mg via ORAL
  Filled 2016-07-29 (×14): qty 2

## 2016-07-29 MED ORDER — NICOTINE 21 MG/24HR TD PT24
21.0000 mg | MEDICATED_PATCH | Freq: Every day | TRANSDERMAL | Status: DC
  Administered 2016-07-29 – 2016-08-03 (×6): 21 mg via TRANSDERMAL
  Filled 2016-07-29 (×9): qty 1

## 2016-07-29 MED ORDER — HYDROXYZINE HCL 25 MG PO TABS
25.0000 mg | ORAL_TABLET | Freq: Four times a day (QID) | ORAL | Status: AC | PRN
  Administered 2016-07-31 (×2): 25 mg via ORAL
  Filled 2016-07-29 (×2): qty 1

## 2016-07-29 MED ORDER — ADULT MULTIVITAMIN W/MINERALS CH
1.0000 | ORAL_TABLET | Freq: Every day | ORAL | Status: DC
  Administered 2016-07-29 – 2016-08-04 (×7): 1 via ORAL
  Filled 2016-07-29 (×9): qty 1

## 2016-07-29 MED ORDER — THIAMINE HCL 100 MG/ML IJ SOLN
100.0000 mg | Freq: Once | INTRAMUSCULAR | Status: AC
  Administered 2016-07-30: 100 mg via INTRAMUSCULAR
  Filled 2016-07-29: qty 2

## 2016-07-29 MED ORDER — GABAPENTIN 300 MG PO CAPS: 600.0000 mg | ORAL_CAPSULE | Freq: Three times a day (TID) | ORAL | Status: DC

## 2016-07-29 MED ORDER — MIRTAZAPINE 15 MG PO TABS
15.0000 mg | ORAL_TABLET | Freq: Every day | ORAL | Status: DC
  Administered 2016-07-29 – 2016-08-01 (×4): 15 mg via ORAL
  Filled 2016-07-29 (×7): qty 1
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
## 2016-07-29 NOTE — Progress Notes (Signed)
Patient attended AA group meeting.  

## 2016-07-29 NOTE — ED Notes (Addendum)
Per TTS AC, sts pt bed ready at 10:30 and to go ahead and send pt. Report given to nurse Patty at [facility].

## 2016-07-29 NOTE — Tx Team (Signed)
Initial Interdisciplinary Treatment Plan   PATIENT STRESSORS: Financial difficulties Loss of Friends over the years Substance abuse   PATIENT STRENGTHS: Curator fund of knowledge   PROBLEM LIST: Problem List/Patient Goals Date to be addressed Date deferred Reason deferred Estimated date of resolution  Depression 07/29/16     Suicidal ideation 07/29/16     Substance abuse 07/29/16     "Get my life back" 07/29/16     "Get off of alcohol" 07/29/16                              DISCHARGE CRITERIA:  Improved stabilization in mood, thinking, and/or behavior Verbal commitment to aftercare and medication compliance Withdrawal symptoms are absent or subacute and managed without 24-hour nursing intervention  PRELIMINARY DISCHARGE PLAN: Outpatient therapy Medication management  PATIENT/FAMIILY INVOLVEMENT: This treatment plan has been presented to and reviewed with the patient, Darrell [name].  The patient and family have been given the opportunity to ask questions and make suggestions.  [name] 07/29/2016, 12:52 PM
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
## 2016-07-30 DIAGNOSIS — R45851 Suicidal ideations: Secondary | ICD-10-CM | POA: Diagnosis not present

## 2016-07-30 DIAGNOSIS — F332 Major depressive disorder, recurrent severe without psychotic features: Principal | ICD-10-CM

## 2016-07-30 DIAGNOSIS — F1094 Alcohol use, unspecified with alcohol-induced mood disorder: Secondary | ICD-10-CM | POA: Diagnosis not present

## 2016-07-30 DIAGNOSIS — F10232 Alcohol dependence with withdrawal with perceptual disturbance: Secondary | ICD-10-CM

## 2016-07-30 LAB — COMPREHENSIVE METABOLIC PANEL
ALK PHOS: 67 U/L (ref 38–126)
ALT: 45 U/L (ref 17–63)
ANION GAP: 7 (ref 5–15)
AST: 30 U/L (ref 15–41)
Albumin: 3.7 g/dL (ref 3.5–5.0)
BILIRUBIN TOTAL: 0.6 mg/dL (ref 0.3–1.2)
BUN: 13 mg/dL (ref 6–20)
CALCIUM: 8.8 mg/dL — AB (ref 8.9–10.3)
CO2: 24 mmol/L (ref 22–32)
Chloride: 107 mmol/L (ref 101–111)
Creatinine, Ser: 1.13 mg/dL (ref 0.61–1.24)
Glucose, Bld: 86 mg/dL (ref 65–99)
Potassium: 4.4 mmol/L (ref 3.5–5.1)
Sodium: 138 mmol/L (ref 135–145)
TOTAL PROTEIN: 6.9 g/dL (ref 6.5–8.1)

## 2016-07-30 LAB — TSH: TSH: 1.991 u[IU]/mL (ref 0.350–4.500)

## 2016-07-30 NOTE — Progress Notes (Signed)
D: Pt endorsed severe anxiety, severe depression and could be med seeking; states, "I am not getting my medications the way I get them at home; why are you giving me one (1) Ativan here, at the ED I got two (2). Pt was irritable and angry; demanding that I should call someone to get his medication straight; states, "The doctor to me I will be getting 600mg  of Neurontin and not 300; you need to call the doctor right away." Pt also complained of severe aching back pain. Pt however, denied SI, HI or AVH. A: Medications offered as prescribed.  Support, encouragement, and safe environment provided.  15-minute safety checks continue. R: Pt was med compliant.  Pt did not attend AA group. Safety checks continue.

## 2016-07-30 NOTE — Progress Notes (Signed)
Adult Psychoeducational Group Note  Date:  07/30/2016 Time:  10:10 PM  Group Topic/Focus:  Wrap-Up Group:   The focus of this group is to help patients review their daily goal of treatment and discuss progress on daily workbooks.   Participation Level:  Active  Participation Quality:  Appropriate  Affect:  Appropriate  Cognitive:  Alert, Appropriate and Oriented  Insight: Appropriate  Engagement in Group:  Engaged  Modes of Intervention:  Discussion  Additional Comments:  Patient attended group and he expressed that his day was a 4.  He is still working on his goal and coping skills.  Darrell [name] W Darrell [name] [phone], 10:10 PM
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
## 2016-07-30 NOTE — Progress Notes (Signed)
D: Pt presents with blunt affect and depressed mood. With drawned / isolative to his room. Minimal interactions noted with peers and staff on as need basis. Endorsed still being depressed and "withdrawing too, I need my medicines"  A: All medications administered as prescribed. Support and availability provided to pt. Writer encouraged pt to attend groups and voice concerns / needs.  Safety maintained on Q 15 minutes checks without issues.  R: Pt did not attend unit groups this shift. Compliant with medications, denies adverse drug reactions when assessed. Remains safe on and off unit.

## 2016-07-30 NOTE — [facility] Group Notes (Signed)
[facility] Group Notes: (Clinical Social Work)   07/30/2016      Type of Therapy:  Group Therapy   Participation Level:  Did Not Attend despite MHT prompting   Selmer Dominion, LCSW 07/30/2016, 3:56 PM

## 2016-07-31 DIAGNOSIS — F10232 Alcohol dependence with withdrawal with perceptual disturbance: Secondary | ICD-10-CM | POA: Diagnosis not present

## 2016-07-31 DIAGNOSIS — F332 Major depressive disorder, recurrent severe without psychotic features: Secondary | ICD-10-CM | POA: Diagnosis not present

## 2016-07-31 DIAGNOSIS — R45851 Suicidal ideations: Secondary | ICD-10-CM | POA: Diagnosis not present

## 2016-07-31 DIAGNOSIS — F1094 Alcohol use, unspecified with alcohol-induced mood disorder: Secondary | ICD-10-CM | POA: Diagnosis not present

## 2016-07-31 NOTE — Progress Notes (Signed)
D: Pt remains isolative to his room. Minimal interactions noted with others on as need basis (meds, vitals, meals / snacks). Presents with depressed affect and mood. Reported sleeping fairly last night " with the medicines the night nurse gave me". Denies SI, HI and AVH when assessed. OOB for medications and meals and back to bed.  A: Support and eavailability provided to pt. All medications administered as prescribed. Continued verbal encouragement offered to pt toward treatment compliance (groups) but to no effect.  Q 15 minutes checks maintained for safety.  R: Pt did not attend scheduled groups.  Pt remains compliant with medications. Denies adverse drug reactions. Safety maintained on and off unit.
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
## 2016-07-31 NOTE — Progress Notes (Signed)
Patient attended the last 15 minutes of AA group meeting.

## 2016-07-31 NOTE — BHH Counselor (Signed)
[facility] Group Notes:  (Clinical Social Work)  07/31/2016  10:00-11:00AM  Summary of Progress/Problems:   The main focus of today's process group was to   1)  Discuss the importance of adding supports  2)  Talk about the need for healthy supports to be able to pursue various healthy coping techniques, I.e. Need for a doctor if one is going to take medications for depression  3)  Identify the patient's current healthy supports and plan what to add.  An emphasis was placed on using counselor, doctor, therapy groups, 12-step groups, and problem-specific support groups to expand supports.    The patient expressed full comprehension of the concepts presented, and agreed that there is a need to add more supports.  The patient stated he has no current healthy supports, stated his 55yo daughter talks to him, but only about herself.  He was irritable when others joined the conversation when he was asking what [facility] is, stated he would only want to be referred there if he knew more about it.  He was angry about being told to leave the [employer] and not open to CSW and other group members encouraging him to pursue the possibility that the ban was temporary, so he could still return to get his ID that is being sent there.  Type of Therapy:  Process Group with Motivational Interviewing  Participation Level:  Active  Participation Quality:  Attentive and Resistant  Affect:  Blunted and Irritable  Cognitive:  Appropriate  Insight:  Improving  Engagement in Therapy:  Improving  Modes of Intervention:   Education, Support and Processing, Activity  Selmer Dominion, LCSW 07/31/2016

## 2016-07-31 NOTE — Progress Notes (Signed)
D: Pt was isolative and withdrawn to his room for most of the evening. Pt continued to be irritable. Pt continued to endorse severe anxiety with moderate depression. Pt also complained of severe aching back pain. Pt however, denied SI, HI or AVH. A: Medications offered as prescribed.  Support, encouragement, and safe environment provided.  15-minute safety checks continue. R: Pt was med compliant.  Pt attended wrap-up group. Safety checks continue.

## 2016-07-31 NOTE — BHH Suicide Risk Assessment (Signed)
[place] INPATIENT:  Family/Significant Other Suicide Prevention Education  Suicide Prevention Education:  Patient Refusal for Family/Significant Other Suicide Prevention Education: The patient Darrell [name] has refused to provide written consent for family/significant other to be provided Family/Significant Other Suicide Prevention Education during admission and/or prior to discharge.  Physician notified.  Patient was given SPE brochure, but refused to review it, stating he already knows about suicide prevention and "I'm not going to kill myself."  Lysle Dingwall 07/31/2016, 4:02 PM

## 2016-08-01 DIAGNOSIS — F10232 Alcohol dependence with withdrawal with perceptual disturbance: Secondary | ICD-10-CM | POA: Diagnosis not present

## 2016-08-01 NOTE — Progress Notes (Signed)
D: Darrell [name] has been isolative to his room. He has denied SI/HI/AVH but reports feeling very depressed. On his self inventory, he rated his depression level 7/10, hopelessness 3/10, and anxiety 6/10. He denied symptoms of withdrawal. He appears dysphoric.   A: Meds given as ordered. Q15 safety checks maintained. Support/encouragement offered.  R: Pt remains free from harm and continues with treatment. Will continue to monitor for needs/safety.

## 2016-08-01 NOTE — Plan of Care (Signed)
Problem: Activity: Goal: Interest or engagement in leisure activities will improve Outcome: Progressing Pt was positive for evening AA group

## 2016-08-01 NOTE — BHH Group Notes (Signed)
[facility] LCSW Aftercare Discharge Planning Group Note  08/01/2016  8:45 AM  Participation Quality: Did Not Attend. Patient invited to participate but declined.  Tilden Fossa, MSW, [place] Clinical Social Worker [facility] [phone]

## 2016-08-01 NOTE — Progress Notes (Signed)
Recreation Therapy Notes  Date: 08/01/16 Time: 0930 Location: [address] Group Room  Group Topic: Stress Management  Goal Area(s) Addresses:  Patient will verbalize importance of using healthy stress management.  Patient will identify positive emotions associated with healthy stress management.   Intervention: Stress Management  Activity :  Forest Visualization.  LRT introduced pt to the technique of guided imagery.  Patients were to follow along as LRT read script to engage in the activity.   Education:  Stress Management, Discharge Planning.   Clinical Observations/Feedback: Pt did not attend group.     Marry Kusch, LRT/CTRS  

## 2016-08-01 NOTE — Progress Notes (Signed)
Patient ID: Darrell [name], male   DOB: [birth year], 55 y.o.   MRN: [id number] D: Patient alert and cooperative. Pt mood/affect is depressed and anxious. Pt denies SI/HI/AVH. Pt attended and engage in evening AA group. No acute distressed noted at this time.   A: Medications administered as prescribed. Emotional support given and will continue to monitor pt's progress for stabilization.  R: Patient remains safe and complaint with medications.
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
## 2016-08-02 DIAGNOSIS — F10232 Alcohol dependence with withdrawal with perceptual disturbance: Secondary | ICD-10-CM | POA: Diagnosis not present

## 2016-08-02 MED ORDER — SERTRALINE HCL 50 MG PO TABS
50.0000 mg | ORAL_TABLET | Freq: Every day | ORAL | Status: DC
  Administered 2016-08-02 – 2016-08-03 (×2): 50 mg via ORAL
  Filled 2016-08-02 (×6): qty 1

## 2016-08-02 MED ORDER — HYDROXYZINE HCL 50 MG PO TABS
50.0000 mg | ORAL_TABLET | Freq: Every evening | ORAL | Status: DC | PRN
  Administered 2016-08-02 (×2): 50 mg via ORAL
  Filled 2016-08-02 (×2): qty 1

## 2016-08-02 MED ORDER — GABAPENTIN 400 MG PO CAPS
800.0000 mg | ORAL_CAPSULE | Freq: Three times a day (TID) | ORAL | Status: DC
  Administered 2016-08-02 – 2016-08-04 (×6): 800 mg via ORAL
  Filled 2016-08-02 (×12): qty 2

## 2016-08-02 NOTE — Plan of Care (Signed)
Problem: Safety: Goal: Ability to remain free from injury will improve Outcome: Progressing Pt is safe and free of injury

## 2016-08-02 NOTE — Progress Notes (Signed)
Recreation Therapy Notes   Animal-Assisted Activity (AAA) Program Checklist/Progress Notes Patient Eligibility Criteria Checklist & Daily Group note for Rec TxIntervention  Date: 08.08.2017 Time: 2:45pm Location: [address]    AAA/T Program Assumption of Risk Form signed by Patient/ or Parent Legal Guardian Yes  Patient is free of allergies or sever asthma Yes  Patient reports no fear of animals Yes  Patient reports no history of cruelty to animals Yes  Patient understands his/her participation is voluntary Yes  Patient washes hands before animal contact Yes  Patient washes hands after animal contact Yes  Behavioral Response: Engaged, Attentive  Education:Hand Washing, Appropriate Animal Interaction   Education Outcome: Acknowledges education.   Clinical Observations/Feedback: Patient attended session and interacted appropriately with therapy dog and peers.   Laureen Ochs Sahiba Granholm, LRT/CTRS   [place] Reta L 08/02/2016 3:05 PM

## 2016-08-02 NOTE — [facility] Group Notes (Signed)
[facility] Group Notes:  (Nursing/MHT/Case Management/Adjunct)  Date:  08/02/2016  Time:  0900  Type of Therapy:  Nurse Education  Participation Level:  Did Not Attend  Summary of Progress/Problems: Pt was invited but did not attend.  Marya Landry 08/02/2016, 12:18 PM

## 2016-08-02 NOTE — [facility] Group Notes (Addendum)
The focus of this group is to educate the patient on the purpose and policies of crisis stabilization and provide a format to answer questions about their admission.  The group details unit policies and expectations of patients while admitted.  Patient did not attend 0900 nurse education orientation group this morning.  
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
## 2016-08-02 NOTE — Progress Notes (Signed)
D:Pt rates depression as a 7 and anxiety as a 7 on 1-10 scale with 10 being the most. Pt reports pain in his joints and says that the neurontin takes his pain away. He has been sleepy today following starting zoloft. Pt is isolative to his room. He reports that he plans to file for disability and says that he had an MI a few years ago.  A:Offered support, encouragement and 15 minute checks.  R:Pt denies si and hi. Safety maintained on the unit.

## 2016-08-02 NOTE — BHH Group Notes (Signed)
[place] LCSW Group Therapy 08/02/2016  1:15 PM   Type of Therapy: Group Therapy  Participation Level: Did Not Attend. Patient invited to participate but declined.   Tilden Fossa, MSW, [place] Clinical Social Worker [facility] [phone]

## 2016-08-02 NOTE — BHH Group Notes (Signed)
Late Entry from 08/01/16:  [facility] LCSW Group Therapy Note     1:30PM  Type of Therapy and Topic:  Group Therapy:  Holding on to Grudges  Participation Level: Active     Description of Group:    In this group patients will be asked to explore and define a grudge.  Patients will be guided to discuss their thoughts, feelings, and behaviors as to why one holds on to grudges and reasons why people have grudges. Patients will process the impact grudges have on daily life and identify thoughts and feelings related to holding on to grudges. Facilitator will challenge patients to identify ways of letting go of grudges and the benefits once released.  Patients will be confronted to address why one struggles letting go of grudges. Lastly, patients will identify feelings and thoughts related to what life would look like without grudges.  This group will be process-oriented, with patients participating in exploration of their own experiences as well as giving and receiving support and challenge from other group members.  Therapeutic Goals: 1. Patient will identify specific grudges related to their personal life. 2. Patient will identify feelings, thoughts, and beliefs around grudges. 3. Patient will identify how one releases grudges appropriately. 4. Patient will identify situations where they could have let go of the grudge, but instead chose to hold on.  Summary of Patient Progress  Patient came to group late and participated minimally in discussion despite CSW encouragement.    Therapeutic Modalities:   Cognitive Behavioral Therapy Solution Focused Therapy Motivational Interviewing Brief Therapy    Tilden Fossa, LCSW Clinical Social Worker [facility] [phone]

## 2016-08-02 NOTE — BHH Group Notes (Signed)
Patient attended AA group meeting.  

## 2016-08-03 DIAGNOSIS — F10230 Alcohol dependence with withdrawal, uncomplicated: Secondary | ICD-10-CM | POA: Diagnosis not present

## 2016-08-03 MED ORDER — ASPIRIN EC 81 MG PO TBEC
81.0000 mg | DELAYED_RELEASE_TABLET | Freq: Every day | ORAL | Status: DC
  Administered 2016-08-03 – 2016-08-04 (×2): 81 mg via ORAL
  Filled 2016-08-03 (×4): qty 1

## 2016-08-03 MED ORDER — GABAPENTIN 300 MG PO CAPS: 600.0000 mg | ORAL_CAPSULE | Freq: Three times a day (TID) | ORAL | Status: DC

## 2016-08-03 MED ORDER — LISINOPRIL 20 MG PO TABS
20.0000 mg | ORAL_TABLET | Freq: Every day | ORAL | Status: DC
  Administered 2016-08-03 – 2016-08-04 (×2): 20 mg via ORAL
  Filled 2016-08-03 (×4): qty 1

## 2016-08-03 MED ORDER — ATORVASTATIN CALCIUM 80 MG PO TABS
80.0000 mg | ORAL_TABLET | Freq: Every day | ORAL | Status: DC
  Administered 2016-08-03 – 2016-08-04 (×2): 80 mg via ORAL
  Filled 2016-08-03 (×4): qty 1

## 2016-08-03 NOTE — Progress Notes (Signed)
Patient ID: Darrell [name], male   DOB: 1961-04-17, 55 y.o.   MRN: [id number] D: Patient calm and cooperative. Pt reports plans to move back to the [place] area after discharge. Pt report there is better serviceses for the homeless. Pt mood/affect is appropriate to situation. Pt denies SI/HI/AVH and pain. Pt attended and engage in evening NA group. No acute distressed noted at this time.   A: Medications administered as prescribed. Emotional support given and will continue to monitor pt's progress for stabilization.  R: Patient remains safe and complaint with medications.

## 2016-08-03 NOTE — Progress Notes (Signed)
[place] denies SI/HI/AVH. Encouragement and support given. Medications administered as prescribed. Continue Q 15 minute checks for patient safety and medication effectiveness.

## 2016-08-03 NOTE — Progress Notes (Signed)
Recreation Therapy Notes  Date: 08/03/16  Time: 0930 Location: [address] Group Room  Group Topic: Stress Management  Goal Area(s) Addresses:  Patient will verbalize importance of using healthy stress management.  Patient will identify positive emotions associated with healthy stress management.   Intervention: Stress Management  Activity :  Deep Breathing.  LRT introduced patients to the stress management technique of deep breathing.  Pt were to follow along as LRT demonstrated and read a script to allow patients to participate in the activity.  Education:  Stress Management, Discharge Planning.   Education Outcome: Acknowledges edcuation/In group clarification offered/Needs additional education  Clinical Observations/Feedback: Pt did not attend group.    Jeny Nield, LRT/CTRS  

## 2016-08-03 NOTE — BHH Group Notes (Signed)
[facility] LCSW Aftercare Discharge Planning Group Note  08/03/2016  8:45 AM  Participation Quality: Did Not Attend. Patient invited to participate but declined.  Tilden Fossa, MSW, [place] Clinical Social Worker [facility] [phone]

## 2016-08-03 NOTE — Plan of Care (Signed)
Problem: Education: Goal: Knowledge of the prescribed therapeutic regimen will improve Outcome: Progressing Patient is taking his prescribed medications appropriately and on schedule.
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
## 2016-08-03 NOTE — Progress Notes (Signed)
Data. Patient denies SI/HI/AVH.   Patient interacting well with staff and other patients. he reports that his Zoloft is having side effects, thatt make him feel, "Just awful, and not able to function physically or mentally".  Patient reports 2/10 for depression 0/10 for hopelessness and 4/10 for anxiety. 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 (The next right thing)." Action. Emotional support and encouragement offered. NP notified of Zoloft side effects. Education provided on medication, indications and side effect. Q 15 minute checks done for safety. Response. Safety on the unit maintained through 15 minute checks.  Medications taken as prescribed. Attended groups. Remained calm and appropriate through out shift.

## 2016-08-04 DIAGNOSIS — F10232 Alcohol dependence with withdrawal with perceptual disturbance: Secondary | ICD-10-CM | POA: Diagnosis not present

## 2016-08-04 MED ORDER — ATORVASTATIN CALCIUM 80 MG PO TABS: 80.0000 mg | ORAL_TABLET | Freq: Every day | ORAL | 0 refills | Status: DC

## 2016-08-04 MED ORDER — GABAPENTIN 400 MG PO CAPS: 800.0000 mg | ORAL_CAPSULE | Freq: Three times a day (TID) | ORAL | 0 refills | Status: DC

## 2016-08-04 MED ORDER — NICOTINE 21 MG/24HR TD PT24: 21.0000 mg | MEDICATED_PATCH | Freq: Every day | TRANSDERMAL | 0 refills | Status: DC

## 2016-08-04 MED ORDER — HYDROXYZINE HCL 50 MG PO TABS: 50.0000 mg | ORAL_TABLET | Freq: Every evening | ORAL | 0 refills | Status: DC | PRN

## 2016-08-04 MED ORDER — LISINOPRIL 20 MG PO TABS: 20.0000 mg | ORAL_TABLET | Freq: Every day | ORAL | 0 refills | Status: DC

## 2016-08-04 MED ORDER — ADULT MULTIVITAMIN W/MINERALS CH: 1.0000 | ORAL_TABLET | Freq: Every day | ORAL | Status: DC

## 2016-08-04 MED ORDER — SERTRALINE HCL 50 MG PO TABS: 50.0000 mg | ORAL_TABLET | Freq: Every day | ORAL | 0 refills | Status: DC

## 2016-08-04 MED ORDER — ASPIRIN 81 MG PO TBEC: 81.0000 mg | DELAYED_RELEASE_TABLET | Freq: Every day | ORAL | 0 refills | Status: DC
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
## 2016-08-04 NOTE — BHH Group Notes (Signed)
The focus of this group is to educate the patient on the purpose and policies of crisis stabilization and provide a format to answer questions about their admission.  The group details unit policies and expectations of patients while admitted.  Patient attended 0900 nurse education orientation group this morning.  Patient actively participated and had appropriate affect.  Patient was alert.  Patient had appropiaite insight and appropriate engagement.  Today patient will work on 3 goals for discharge.

## 2016-08-04 NOTE — Progress Notes (Signed)
D:  Patient's self inventory sheet, patient has fair sleep, no sleep medication.  Fair appetite, normal energy level, good concentration.  Denied depression and hopeless, rated anxiety #3.  Denied withdrawals.  Patient stated he is still irritable.   Denied SI.  Denied physical problems.  Denied pain.  Goal is getting down the road and taking care of business.  Will do what he must do.  "Thank you very much to each and every one.  Thank you for your hospitality."  Does have discharge plans. A:  Patient refused nicotine patch and zoloft 50 mg this morning.  Emotional support and encouragement given patient. R:  Denied SI and HI, contracts for safety.  Denied A/V hallucinations.  Safety maintained with 15 minute checks. Patient looking forward to discharge today and going to see his family.

## 2016-08-04 NOTE — Progress Notes (Addendum)
  [facility] Case Management Discharge Plan :  Will you be returning to the same living situation after discharge:  No. Patient plans to stay in a shelter in [place] At discharge, do you have transportation home?: Yes,  patient provided with bus fare Do you have the ability to pay for your medications: Yes,  patient will be provided with prescriptions   Release of information consent forms completed and in the chart;  Patient's signature needed at discharge.  Patient to Follow up at: Follow-up Information    [facility] Follow up on 08/17/2016.   Why:  New patient appointment on Wednesday August 23rd at 2pm for medication management with Lew Dawes, P.A. Call office if you need to reschedule. Please bring your ID and insurance card.  Contact information: [address] [place],  [address] Phone: [phone] Fax: [phone]        Next level of care provider has access to [name] and Suicide Prevention discussed: Yes,  with patient  Have you used any form of tobacco in the last 30 days? (Cigarettes, Smokeless Tobacco, Cigars, and/or Pipes): Yes  Has patient been referred to the Quitline?: Patient refused referral  Patient has been referred for addiction treatment: Yes  Darrell Needle Angie [name] 08/04/2016, 9:39 AM

## 2016-08-04 NOTE — Progress Notes (Addendum)
Discharge Note:  Patient discharged with bus ticket.  Patient denied SI and HI.  Denied A/V hallucinations.  Suicide prevention information given and discussed with patient who stated he understood and had no questions.  Patient received all his belongings, clothing, boots, prescriptions, etc.  Patient stated he appreciated all assistance received from [facility] staff.  All required discharge information given to patient at discharge.

## 2016-09-22 NOTE — Progress Notes (Signed)
This encounter was created in error - please disregard.

## 2017-03-01 ENCOUNTER — Emergency Department
Admission: EM | Admit: 2017-03-01 | Discharge: 2017-03-02 | Disposition: A | Discharge status: Other Acute Inpatient Hospital | Payer: BLUE CROSS/BLUE SHIELD | Attending: Emergency Medicine | Admitting: Emergency Medicine

## 2017-03-01 ENCOUNTER — Encounter: Payer: Self-pay | Admitting: Emergency Medicine

## 2017-03-01 DIAGNOSIS — F32A Depression, unspecified: Secondary | ICD-10-CM

## 2017-03-01 DIAGNOSIS — I252 Old myocardial infarction: Secondary | ICD-10-CM | POA: Insufficient documentation

## 2017-03-01 DIAGNOSIS — Z7982 Long term (current) use of aspirin: Secondary | ICD-10-CM | POA: Insufficient documentation

## 2017-03-01 DIAGNOSIS — F1010 Alcohol abuse, uncomplicated: Secondary | ICD-10-CM | POA: Diagnosis present

## 2017-03-01 DIAGNOSIS — F329 Major depressive disorder, single episode, unspecified: Secondary | ICD-10-CM | POA: Insufficient documentation

## 2017-03-01 DIAGNOSIS — F1094 Alcohol use, unspecified with alcohol-induced mood disorder: Secondary | ICD-10-CM | POA: Diagnosis present

## 2017-03-01 DIAGNOSIS — Z79899 Other long term (current) drug therapy: Secondary | ICD-10-CM | POA: Insufficient documentation

## 2017-03-01 DIAGNOSIS — F10129 Alcohol abuse with intoxication, unspecified: Secondary | ICD-10-CM | POA: Diagnosis present

## 2017-03-01 DIAGNOSIS — F10239 Alcohol dependence with withdrawal, unspecified: Secondary | ICD-10-CM | POA: Diagnosis present

## 2017-03-01 DIAGNOSIS — N182 Chronic kidney disease, stage 2 (mild): Secondary | ICD-10-CM | POA: Insufficient documentation

## 2017-03-01 DIAGNOSIS — I2510 Atherosclerotic heart disease of native coronary artery without angina pectoris: Secondary | ICD-10-CM | POA: Diagnosis not present

## 2017-03-01 DIAGNOSIS — R45851 Suicidal ideations: Secondary | ICD-10-CM | POA: Diagnosis present

## 2017-03-01 DIAGNOSIS — I129 Hypertensive chronic kidney disease with stage 1 through stage 4 chronic kidney disease, or unspecified chronic kidney disease: Secondary | ICD-10-CM | POA: Diagnosis not present

## 2017-03-01 DIAGNOSIS — F1990 Other psychoactive substance use, unspecified, uncomplicated: Secondary | ICD-10-CM

## 2017-03-01 DIAGNOSIS — F332 Major depressive disorder, recurrent severe without psychotic features: Secondary | ICD-10-CM | POA: Diagnosis present

## 2017-03-01 DIAGNOSIS — F10939 Alcohol use, unspecified with withdrawal, unspecified: Secondary | ICD-10-CM | POA: Diagnosis present

## 2017-03-01 DIAGNOSIS — F17210 Nicotine dependence, cigarettes, uncomplicated: Secondary | ICD-10-CM | POA: Diagnosis not present

## 2017-03-01 LAB — CBC
HEMATOCRIT: 51.9 % (ref 40.0–52.0)
Hemoglobin: 17.7 g/dL (ref 13.0–18.0)
MCH: 31.3 pg (ref 26.0–34.0)
MCHC: 34.1 g/dL (ref 32.0–36.0)
MCV: 91.9 fL (ref 80.0–100.0)
Platelets: 265 10*3/uL (ref 150–440)
RBC: 5.64 MIL/uL (ref 4.40–5.90)
RDW: 15 % — ABNORMAL HIGH (ref 11.5–14.5)
WBC: 9.4 10*3/uL (ref 3.8–10.6)

## 2017-03-01 LAB — COMPREHENSIVE METABOLIC PANEL
ALBUMIN: 4.5 g/dL (ref 3.5–5.0)
ALT: 277 U/L — ABNORMAL HIGH (ref 17–63)
AST: 190 U/L — AB (ref 15–41)
Alkaline Phosphatase: 73 U/L (ref 38–126)
Anion gap: 10 (ref 5–15)
BILIRUBIN TOTAL: 1.2 mg/dL (ref 0.3–1.2)
BUN: 8 mg/dL (ref 6–20)
CHLORIDE: 105 mmol/L (ref 101–111)
CO2: 25 mmol/L (ref 22–32)
Calcium: 9 mg/dL (ref 8.9–10.3)
Creatinine, Ser: 0.82 mg/dL (ref 0.61–1.24)
GFR calc Af Amer: >60 mL/min (ref >60)
GFR calc non Af Amer: >60 mL/min (ref >60)
GLUCOSE: 117 mg/dL — AB (ref 65–99)
POTASSIUM: 3.5 mmol/L (ref 3.5–5.1)
Sodium: 140 mmol/L (ref 135–145)
Total Protein: 8.4 g/dL — ABNORMAL HIGH (ref 6.5–8.1)

## 2017-03-01 LAB — URINE DRUG SCREEN, QUALITATIVE (ARMC ONLY)
AMPHETAMINES, UR SCREEN: NOT DETECTED
Barbiturates, Ur Screen: NOT DETECTED
Benzodiazepine, Ur Scrn: NOT DETECTED
Cannabinoid 50 Ng, Ur ~~LOC~~: POSITIVE — AB
Cocaine Metabolite,Ur ~~LOC~~: POSITIVE — AB
MDMA (ECSTASY) UR SCREEN: NOT DETECTED
Methadone Scn, Ur: NOT DETECTED
OPIATE, UR SCREEN: NOT DETECTED
PHENCYCLIDINE (PCP) UR S: NOT DETECTED
Tricyclic, Ur Screen: NOT DETECTED

## 2017-03-01 LAB — SALICYLATE LEVEL: Salicylate Lvl: <7 mg/dL (ref 2.8–30.0)

## 2017-03-01 LAB — ETHANOL: Alcohol, Ethyl (B): 312 mg/dL (ref <5)

## 2017-03-01 LAB — ACETAMINOPHEN LEVEL: Acetaminophen (Tylenol), Serum: <10 ug/mL — ABNORMAL LOW (ref 10–30)

## 2017-03-01 MED ORDER — NICOTINE 21 MG/24HR TD PT24
21.0000 mg | MEDICATED_PATCH | Freq: Every day | TRANSDERMAL | Status: DC
  Administered 2017-03-01: 21 mg via TRANSDERMAL
  Filled 2017-03-01 (×2): qty 1

## 2017-03-01 MED ORDER — ATORVASTATIN CALCIUM 20 MG PO TABS
80.0000 mg | ORAL_TABLET | Freq: Every day | ORAL | Status: DC
  Administered 2017-03-01 – 2017-03-02 (×2): 80 mg via ORAL
  Filled 2017-03-01 (×2): qty 4

## 2017-03-01 MED ORDER — SERTRALINE HCL 50 MG PO TABS
50.0000 mg | ORAL_TABLET | Freq: Every day | ORAL | Status: DC
  Administered 2017-03-02: 50 mg via ORAL
  Filled 2017-03-01: qty 1

## 2017-03-01 MED ORDER — LISINOPRIL 20 MG PO TABS
20.0000 mg | ORAL_TABLET | Freq: Every day | ORAL | Status: DC
  Administered 2017-03-01 – 2017-03-02 (×2): 20 mg via ORAL
  Filled 2017-03-01 (×2): qty 1

## 2017-03-01 MED ORDER — ASPIRIN EC 81 MG PO TBEC
81.0000 mg | DELAYED_RELEASE_TABLET | Freq: Every day | ORAL | Status: DC
  Administered 2017-03-01 – 2017-03-02 (×2): 81 mg via ORAL
  Filled 2017-03-01 (×2): qty 1

## 2017-03-01 MED ORDER — HYDROXYZINE HCL 25 MG PO TABS
50.0000 mg | ORAL_TABLET | Freq: Every evening | ORAL | Status: DC | PRN
  Administered 2017-03-01: 50 mg via ORAL
  Filled 2017-03-01: qty 2
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
## 2017-03-01 NOTE — ED Triage Notes (Signed)
Pt brought to ED by [employer], voluntary with reports that he needs to have "his mind checked," has been off of medication x1 week. Pt reports he has been drinking alcohol today and smoking marijuana. Pt is calm and cooperative with loud responses while singing in triage. Pt sts he is SI and has had thoughts of running in front of traffic.
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
## 2017-03-01 NOTE — ED Notes (Signed)
Food tray given 

## 2017-03-02 ENCOUNTER — Inpatient Hospital Stay
Admission: AD | Admit: 2017-03-02 | Discharge: 2017-03-08 | DRG: 885 | Disposition: A | Discharge status: Home / Self Care | Payer: BLUE CROSS/BLUE SHIELD | Source: Intra-hospital | Attending: Psychiatry | Admitting: Psychiatry

## 2017-03-02 DIAGNOSIS — F17200 Nicotine dependence, unspecified, uncomplicated: Secondary | ICD-10-CM | POA: Diagnosis present

## 2017-03-02 DIAGNOSIS — E785 Hyperlipidemia, unspecified: Secondary | ICD-10-CM | POA: Diagnosis present

## 2017-03-02 DIAGNOSIS — I129 Hypertensive chronic kidney disease with stage 1 through stage 4 chronic kidney disease, or unspecified chronic kidney disease: Secondary | ICD-10-CM | POA: Diagnosis present

## 2017-03-02 DIAGNOSIS — F10239 Alcohol dependence with withdrawal, unspecified: Secondary | ICD-10-CM | POA: Diagnosis present

## 2017-03-02 DIAGNOSIS — Z955 Presence of coronary angioplasty implant and graft: Secondary | ICD-10-CM

## 2017-03-02 DIAGNOSIS — E7800 Pure hypercholesterolemia, unspecified: Secondary | ICD-10-CM | POA: Diagnosis present

## 2017-03-02 DIAGNOSIS — F332 Major depressive disorder, recurrent severe without psychotic features: Secondary | ICD-10-CM | POA: Diagnosis present

## 2017-03-02 DIAGNOSIS — Z7982 Long term (current) use of aspirin: Secondary | ICD-10-CM | POA: Diagnosis not present

## 2017-03-02 DIAGNOSIS — Z590 Homelessness: Secondary | ICD-10-CM

## 2017-03-02 DIAGNOSIS — Z79899 Other long term (current) drug therapy: Secondary | ICD-10-CM

## 2017-03-02 DIAGNOSIS — F1020 Alcohol dependence, uncomplicated: Secondary | ICD-10-CM | POA: Diagnosis present

## 2017-03-02 DIAGNOSIS — B1920 Unspecified viral hepatitis C without hepatic coma: Secondary | ICD-10-CM | POA: Diagnosis present

## 2017-03-02 DIAGNOSIS — F1094 Alcohol use, unspecified with alcohol-induced mood disorder: Secondary | ICD-10-CM | POA: Diagnosis present

## 2017-03-02 DIAGNOSIS — G8929 Other chronic pain: Secondary | ICD-10-CM | POA: Diagnosis present

## 2017-03-02 DIAGNOSIS — R45851 Suicidal ideations: Secondary | ICD-10-CM | POA: Diagnosis present

## 2017-03-02 DIAGNOSIS — F1220 Cannabis dependence, uncomplicated: Secondary | ICD-10-CM | POA: Diagnosis present

## 2017-03-02 DIAGNOSIS — F329 Major depressive disorder, single episode, unspecified: Secondary | ICD-10-CM | POA: Diagnosis not present

## 2017-03-02 DIAGNOSIS — F1420 Cocaine dependence, uncomplicated: Secondary | ICD-10-CM

## 2017-03-02 DIAGNOSIS — Z8249 Family history of ischemic heart disease and other diseases of the circulatory system: Secondary | ICD-10-CM | POA: Diagnosis not present

## 2017-03-02 DIAGNOSIS — G4700 Insomnia, unspecified: Secondary | ICD-10-CM | POA: Diagnosis present

## 2017-03-02 DIAGNOSIS — F17210 Nicotine dependence, cigarettes, uncomplicated: Secondary | ICD-10-CM | POA: Diagnosis present

## 2017-03-02 DIAGNOSIS — N182 Chronic kidney disease, stage 2 (mild): Secondary | ICD-10-CM | POA: Diagnosis present

## 2017-03-02 DIAGNOSIS — F10939 Alcohol use, unspecified with withdrawal, unspecified: Secondary | ICD-10-CM | POA: Diagnosis present

## 2017-03-02 DIAGNOSIS — I252 Old myocardial infarction: Secondary | ICD-10-CM | POA: Diagnosis not present

## 2017-03-02 MED ORDER — FOLIC ACID 1 MG PO TABS: 1.0000 mg | ORAL_TABLET | Freq: Every day | ORAL | Status: DC

## 2017-03-02 MED ORDER — SERTRALINE HCL 25 MG PO TABS
50.0000 mg | ORAL_TABLET | Freq: Every day | ORAL | Status: DC
  Administered 2017-03-03 – 2017-03-08 (×6): 50 mg via ORAL
  Filled 2017-03-02 (×6): qty 2

## 2017-03-02 MED ORDER — LORAZEPAM 1 MG PO TABS
1.0000 mg | ORAL_TABLET | Freq: Four times a day (QID) | ORAL | Status: DC | PRN
Start: 2017-03-02 — End: 2017-03-02
  Administered 2017-03-02: 1 mg via ORAL
  Filled 2017-03-02: qty 1

## 2017-03-02 MED ORDER — LORAZEPAM 2 MG PO TABS: 2.0000 mg | ORAL_TABLET | ORAL | Status: DC | PRN

## 2017-03-02 MED ORDER — ATORVASTATIN CALCIUM 20 MG PO TABS: 80.0000 mg | ORAL_TABLET | Freq: Every day | ORAL | Status: DC

## 2017-03-02 MED ORDER — FOLIC ACID 1 MG PO TABS
1.0000 mg | ORAL_TABLET | Freq: Every day | ORAL | Status: DC
  Administered 2017-03-02: 1 mg via ORAL
  Filled 2017-03-02: qty 1

## 2017-03-02 MED ORDER — VITAMIN B-1 100 MG PO TABS: 100.0000 mg | ORAL_TABLET | Freq: Every day | ORAL | Status: DC

## 2017-03-02 MED ORDER — THIAMINE HCL 100 MG/ML IJ SOLN: 100.0000 mg | Freq: Every day | INTRAMUSCULAR | Status: DC

## 2017-03-02 MED ORDER — ADULT MULTIVITAMIN W/MINERALS CH
1.0000 | ORAL_TABLET | Freq: Every day | ORAL | Status: DC
  Administered 2017-03-02: 1 via ORAL
  Filled 2017-03-02: qty 1

## 2017-03-02 MED ORDER — LORAZEPAM 2 MG/ML IJ SOLN: 1.0000 mg | Freq: Four times a day (QID) | INTRAMUSCULAR | Status: DC | PRN

## 2017-03-02 MED ORDER — ASPIRIN EC 81 MG PO TBEC
81.0000 mg | DELAYED_RELEASE_TABLET | Freq: Every day | ORAL | Status: DC
  Administered 2017-03-03 – 2017-03-08 (×6): 81 mg via ORAL
  Filled 2017-03-02 (×6): qty 1

## 2017-03-02 MED ORDER — MAGNESIUM HYDROXIDE 400 MG/5ML PO SUSP: 30.0000 mL | Freq: Every day | ORAL | Status: DC | PRN

## 2017-03-02 MED ORDER — NICOTINE 21 MG/24HR TD PT24: 21.0000 mg | MEDICATED_PATCH | Freq: Every day | TRANSDERMAL | Status: DC

## 2017-03-02 MED ORDER — LORAZEPAM 2 MG PO TABS
ORAL_TABLET | ORAL | Status: AC
  Filled 2017-03-02: qty 1

## 2017-03-02 MED ORDER — THIAMINE HCL 100 MG/ML IJ SOLN
100.0000 mg | INTRAMUSCULAR | Status: AC
  Administered 2017-03-02: 100 mg via INTRAMUSCULAR
  Filled 2017-03-02: qty 2

## 2017-03-02 MED ORDER — LORAZEPAM 1 MG PO TABS
1.0000 mg | ORAL_TABLET | Freq: Three times a day (TID) | ORAL | Status: DC | PRN
  Administered 2017-03-02: 1 mg via ORAL
  Filled 2017-03-02: qty 1

## 2017-03-02 MED ORDER — ACETAMINOPHEN 325 MG PO TABS: 650.0000 mg | ORAL_TABLET | Freq: Four times a day (QID) | ORAL | Status: DC | PRN

## 2017-03-02 MED ORDER — GABAPENTIN 400 MG PO CAPS
800.0000 mg | ORAL_CAPSULE | Freq: Three times a day (TID) | ORAL | Status: DC
  Administered 2017-03-03 – 2017-03-05 (×8): 800 mg via ORAL
  Filled 2017-03-02 (×9): qty 2

## 2017-03-02 MED ORDER — ADULT MULTIVITAMIN W/MINERALS CH: 1.0000 | ORAL_TABLET | Freq: Every day | ORAL | Status: DC

## 2017-03-02 MED ORDER — LISINOPRIL 10 MG PO TABS
20.0000 mg | ORAL_TABLET | Freq: Every day | ORAL | Status: DC
  Administered 2017-03-03 – 2017-03-06 (×4): 20 mg via ORAL
  Filled 2017-03-02 (×4): qty 2

## 2017-03-02 MED ORDER — ONDANSETRON HCL 4 MG PO TABS: 4.0000 mg | ORAL_TABLET | Freq: Three times a day (TID) | ORAL | Status: DC | PRN

## 2017-03-02 MED ORDER — LORAZEPAM 1 MG PO TABS
1.0000 mg | ORAL_TABLET | Freq: Four times a day (QID) | ORAL | Status: DC | PRN
  Filled 2017-03-02: qty 1

## 2017-03-02 MED ORDER — ALUM & MAG HYDROXIDE-SIMETH 200-200-20 MG/5ML PO SUSP: 30.0000 mL | ORAL | Status: DC | PRN

## 2017-03-02 NOTE — ED Notes (Addendum)
Pt ask about shower and pt stated no he did not want to take a shower right now. I will check back with pt later today when pt is more alert. Trash removed from pt room.

## 2017-03-02 NOTE — ED Notes (Signed)
Pt ask if he was ready to take a shower. Pt stated no he was just to sleepy. Told pt I would check back later. Pt ambulated to bathroom unassisted.

## 2017-03-02 NOTE — ED Notes (Signed)
Pt sleeping. 

## 2017-03-02 NOTE — ED Notes (Signed)
Pt given breakfast tray. Pt stated he wasn't hungry. Tray left at bedside.

## 2017-03-02 NOTE — BH Assessment (Signed)
Patient is to be admitted to [place] by Dr. Weber Cooks.  Attending Physician will be Dr. Jerilee Hoh.   Patient has been assigned to room 319, by [facility] Charge Nurse Dav[place]   Intake Paper Work has been signed and placed on patient chart.  ER staff is aware of the admission Lattie Haw, ER Sect.; Dr. Marcelene Butte, ER MD; Marya Amsler, [place], Patient Access).
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
## 2017-03-02 NOTE — ED Notes (Signed)
Pt woke long enough for EKG and went back to sleep.

## 2017-03-02 NOTE — ED Notes (Signed)
Pt sleeping. Lunch tray placed at bedside.

## 2017-03-02 NOTE — ED Notes (Signed)
Pt ask if he was ready to eat. Pt stated I'm ok. Pt was shaking and I ask if he was having withdrawals, pt stated yes. RN was notified.

## 2017-03-02 NOTE — ED Notes (Signed)
Care report received, care assumed from [facility]

## 2017-03-02 NOTE — ED Provider Notes (Signed)
-----------------------------------------   6:53 AM on 03/02/2017 -----------------------------------------   Blood pressure 108/85, pulse 100, temperature 97.8 F (36.6 C), temperature source Oral, resp. rate 20, height 5\' 11"  (1.803 m), weight 187 lb (84.8 kg), SpO2 97 %.  The patient had no acute events since last update.  Calm and cooperative at this time.  Disposition is pending Psychiatry/Behavioral Medicine team recommendations.     Paulette Blanch, MD 03/02/17 [phone]

## 2017-03-02 NOTE — ED Provider Notes (Signed)
EKG note Patient sees EKG was requested by psychiatry  ED ECG REPORT I, Daymon Larsen, the attending physician, personally viewed and interpreted this ECG.  Date: 03/02/2017 EKG Time: 1533 Rate: 91Rhythm: normal sinus rhythm QRS Axis: normal Intervals: normal ST/T Wave abnormalities: normal Conduction Disturbances: none Narrative Interpretation: unremarkable No acute ischemic changes Normal EKG   Daymon Larsen, MD 03/02/17 1629

## 2017-03-02 NOTE — ED Notes (Signed)
Attempted to call report. In middle of medication hand out. Will be done at 10pm. Will call back then

## 2017-03-03 ENCOUNTER — Encounter: Payer: Self-pay | Admitting: Psychiatry

## 2017-03-03 DIAGNOSIS — F332 Major depressive disorder, recurrent severe without psychotic features: Principal | ICD-10-CM

## 2017-03-03 DIAGNOSIS — F1220 Cannabis dependence, uncomplicated: Secondary | ICD-10-CM | POA: Diagnosis present

## 2017-03-03 DIAGNOSIS — F1420 Cocaine dependence, uncomplicated: Secondary | ICD-10-CM

## 2017-03-03 LAB — LIPID PANEL
CHOL/HDL RATIO: 2.3 ratio
CHOLESTEROL: 139 mg/dL (ref 0–200)
HDL: 61 mg/dL (ref >40)
LDL Cholesterol: 63 mg/dL (ref 0–99)
Triglycerides: 76 mg/dL (ref <150)
VLDL: 15 mg/dL (ref 0–40)

## 2017-03-03 LAB — TSH: TSH: 0.996 u[IU]/mL (ref 0.350–4.500)

## 2017-03-03 MED ORDER — ONDANSETRON HCL 4 MG PO TABS
4.0000 mg | ORAL_TABLET | Freq: Three times a day (TID) | ORAL | Status: DC | PRN
  Administered 2017-03-03: 4 mg via ORAL
  Filled 2017-03-03: qty 1

## 2017-03-03 MED ORDER — LISINOPRIL 10 MG PO TABS: 20.0000 mg | ORAL_TABLET | Freq: Every day | ORAL | Status: DC

## 2017-03-03 MED ORDER — VITAMIN B-1 100 MG PO TABS
100.0000 mg | ORAL_TABLET | Freq: Every day | ORAL | Status: DC
  Administered 2017-03-03 – 2017-03-07 (×5): 100 mg via ORAL
  Filled 2017-03-03 (×5): qty 1

## 2017-03-03 MED ORDER — CHLORDIAZEPOXIDE HCL 25 MG PO CAPS
50.0000 mg | ORAL_CAPSULE | Freq: Four times a day (QID) | ORAL | Status: AC
Start: 2017-03-03 — End: 2017-03-06
  Administered 2017-03-03 – 2017-03-06 (×11): 50 mg via ORAL
  Filled 2017-03-03 (×12): qty 2

## 2017-03-03 MED ORDER — LORAZEPAM 1 MG PO TABS
1.0000 mg | ORAL_TABLET | Freq: Four times a day (QID) | ORAL | Status: DC | PRN
  Administered 2017-03-03 – 2017-03-04 (×2): 1 mg via ORAL
  Filled 2017-03-03 (×2): qty 1

## 2017-03-03 MED ORDER — ADULT MULTIVITAMIN W/MINERALS CH: 1.0000 | ORAL_TABLET | Freq: Every day | ORAL | Status: DC

## 2017-03-03 MED ORDER — HYDROXYZINE HCL 50 MG PO TABS: 50.0000 mg | ORAL_TABLET | Freq: Every evening | ORAL | Status: DC | PRN

## 2017-03-03 MED ORDER — ASPIRIN EC 81 MG PO TBEC: 81.0000 mg | DELAYED_RELEASE_TABLET | Freq: Every day | ORAL | Status: DC

## 2017-03-03 MED ORDER — PANTOPRAZOLE SODIUM 40 MG PO TBEC
40.0000 mg | DELAYED_RELEASE_TABLET | Freq: Every day | ORAL | Status: DC
  Administered 2017-03-03 – 2017-03-08 (×6): 40 mg via ORAL
  Filled 2017-03-03 (×6): qty 1

## 2017-03-03 MED ORDER — SERTRALINE HCL 25 MG PO TABS: 50.0000 mg | ORAL_TABLET | Freq: Every day | ORAL | Status: DC

## 2017-03-03 MED ORDER — NICOTINE 21 MG/24HR TD PT24
21.0000 mg | MEDICATED_PATCH | Freq: Every day | TRANSDERMAL | Status: DC
  Administered 2017-03-04 – 2017-03-08 (×5): 21 mg via TRANSDERMAL
  Filled 2017-03-03 (×6): qty 1

## 2017-03-03 MED ORDER — NICOTINE 21 MG/24HR TD PT24: 21.0000 mg | MEDICATED_PATCH | Freq: Every day | TRANSDERMAL | Status: DC

## 2017-03-03 MED ORDER — ONDANSETRON HCL 4 MG PO TABS: 4.0000 mg | ORAL_TABLET | Freq: Three times a day (TID) | ORAL | Status: DC | PRN

## 2017-03-03 MED ORDER — FOLIC ACID 1 MG PO TABS: 1.0000 mg | ORAL_TABLET | Freq: Every day | ORAL | Status: DC

## 2017-03-03 MED ORDER — ATORVASTATIN CALCIUM 20 MG PO TABS: 80.0000 mg | ORAL_TABLET | Freq: Every day | ORAL | Status: DC

## 2017-03-03 MED ORDER — LORAZEPAM 2 MG/ML IJ SOLN: 1.0000 mg | Freq: Four times a day (QID) | INTRAMUSCULAR | Status: DC | PRN

## 2017-03-03 MED ORDER — QUETIAPINE FUMARATE 100 MG PO TABS
100.0000 mg | ORAL_TABLET | Freq: Every day | ORAL | Status: DC
  Filled 2017-03-03: qty 1
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
## 2017-03-03 NOTE — Progress Notes (Signed)
Recreation Therapy Notes  INPATIENT RECREATION THERAPY ASSESSMENT  Patient Details Name: Darrell [name] MRN: [id number] DOB: [birth year] Today's Date: 03-29-2017  Patient Stressors: Death, Friends, Other (Comment) (Mother died 83 years ago, step father died after that then uncle then aunt; lack of supportive friends; homeless; lost wallet with ID and credit card)  Coping Skills:   Isolate, Substance Abuse, Avoidance, Exercise, Art/Dance, Talking, Music, Sports  Personal Challenges: Anger, Communication, Concentration, Decision-Making, Expressing Yourself, Relationships, Self-Esteem/Confidence, Substance Abuse, Trusting Others  Leisure Interests (2+):   (Anything that has nothing to do with drinking and drugs)  Awareness of Community Resources:  Yes  Community Resources:  Park  Current Use: No  If no, Barriers?: Other (Comment) (Drinking a lot)  Patient Strengths:  Daughter and granddaughters  Patient Identified Areas of Improvement:  "All of them"  Current Recreation Participation:  Messing up  Patient Goal for Hospitalization:  To stop drinking and get back to work  [place] of Residence:  [place] [employer] of Residence:  [place]   Current SI (including self-harm):  No  Current HI:  No  Consent to Intern Participation: N/A   Leonette Monarch, LRT/CTRS 03-29-2017, 4:58 PM

## 2017-03-03 NOTE — Progress Notes (Signed)
Affect flat.  Has remained in bed sleeping the majority of the day.  Up once late for lunch.  Denies SI/HI/AVH.  Assessed per CIWA protocol.  Medications administered according.  Gatorade encouraged.  Support and encouragement offered.  Safety maintained.

## 2017-03-03 NOTE — BHH Group Notes (Signed)
[place] LCSW Group Therapy Note  Date/Time: 03/03/17, 0930  Type of Therapy and Topic:  Group Therapy:  Feelings around Relapse and Recovery  Participation Level:  Did Not Attend   Mood:  Description of Group:    Patients in this group will discuss emotions they experience before and after a relapse. They will process how experiencing these feelings, or avoidance of experiencing them, relates to having a relapse. Facilitator will guide patients to explore emotions they have related to recovery. Patients will be encouraged to process which emotions are more powerful. They will be guided to discuss the emotional reaction significant others in their lives may have to patients' relapse or recovery. Patients will be assisted in exploring ways to respond to the emotions of others without this contributing to a relapse.  Therapeutic Goals: 1. Patient will identify two or more emotions that lead to relapse for them:  2. Patient will identify two emotions that result when they relapse:  3. Patient will identify two emotions related to recovery:  4. Patient will demonstrate ability to communicate their needs through discussion and/or role plays.   Summary of Patient Progress:     Therapeutic Modalities:   Cognitive Behavioral Therapy Solution-Focused Therapy Assertiveness Training Relapse Prevention Therapy  Lurline Idol, LCSW
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
## 2017-03-03 NOTE — Tx Team (Signed)
Interdisciplinary Treatment and Diagnostic Plan Update  03/03/2017 Time of Session: 10:30 AM Darrell [name] MRN: [id number]  Principal Diagnosis: Severe recurrent major depression without psychotic features [facility])  Secondary Diagnoses: Principal Problem:   Severe recurrent major depression without psychotic features ([place]) Active Problems:   Tobacco use disorder   Alcohol withdrawal ([place])   Alcohol use disorder, severe, dependence ([place])   Alcohol-induced mood disorder ([place])   Cannabis use disorder, moderate, dependence ([place])   Current Medications:  Current Facility-Administered Medications  Medication Dose Route Frequency Provider Last Rate Last Dose  . alum & mag hydroxide-simeth (MAALOX/MYLANTA) 200-200-20 MG/5ML suspension 30 mL  30 mL Oral Q4H PRN Gonzella Lex, MD      . aspirin EC tablet 81 mg  81 mg Oral Daily Gonzella Lex, MD   81 mg at 03/03/17 0859  . chlordiazePOXIDE (LIBRIUM) capsule 50 mg  50 mg Oral QID Jolanta B Pucilowska, MD      . gabapentin (NEURONTIN) capsule 800 mg  800 mg Oral TID Gonzella Lex, MD   800 mg at 03/03/17 0859  . hydrOXYzine (ATARAX/VISTARIL) tablet 50 mg  50 mg Oral QHS PRN Gonzella Lex, MD      . lisinopril (PRINIVIL,ZESTRIL) tablet 20 mg  20 mg Oral Daily Gonzella Lex, MD   20 mg at 03/03/17 0859  . LORazepam (ATIVAN) tablet 1 mg  1 mg Oral Q6H PRN Gonzella Lex, MD   1 mg at 03/03/17 0859   Or  . LORazepam (ATIVAN) injection 1 mg  1 mg Intravenous Q6H PRN Gonzella Lex, MD      . magnesium hydroxide (MILK OF MAGNESIA) suspension 30 mL  30 mL Oral Daily PRN Gonzella Lex, MD      . nicotine (NICODERM CQ - dosed in mg/24 hours) patch 21 mg  21 mg Transdermal Daily Jolanta B Pucilowska, MD      . ondansetron (ZOFRAN) tablet 4 mg  4 mg Oral Q8H PRN Gonzella Lex, MD   4 mg at 03/03/17 0859  . ondansetron (ZOFRAN) tablet 4 mg  4 mg Oral Q8H PRN Jolanta B Pucilowska, MD      . pantoprazole (PROTONIX) EC tablet 40 mg  40 mg Oral Daily  Jolanta B Pucilowska, MD      . QUEtiapine (SEROQUEL) tablet 100 mg  100 mg Oral QHS Jolanta B Pucilowska, MD      . sertraline (ZOLOFT) tablet 50 mg  50 mg Oral Daily Gonzella Lex, MD   50 mg at 03/03/17 0859  . thiamine (VITAMIN B-1) tablet 100 mg  100 mg Oral Daily Gonzella Lex, MD   100 mg at 03/03/17 0859   PTA Medications: Prescriptions Prior to Admission  Medication Sig Dispense Refill Last Dose  . aspirin 81 MG EC tablet Take 1 tablet (81 mg total) by mouth daily. For heart health 1 tablet 0 03/02/2017 at Unknown time  . atorvastatin (LIPITOR) 80 MG tablet Take 1 tablet (80 mg total) by mouth daily. For high cholesterol 30 tablet 0 03/02/2017 at Unknown time  . gabapentin (NEURONTIN) 400 MG capsule Take 2 capsules (800 mg total) by mouth 3 (three) times daily. For agitation/substance withdrawal symptoms 180 capsule 0 03/02/2017 at Unknown time  . lisinopril (PRINIVIL,ZESTRIL) 20 MG tablet Take 1 tablet (20 mg total) by mouth daily. For high blood pressure 30 tablet 0 03/02/2017 at Unknown time  . Multiple Vitamin (MULTIVITAMIN WITH MINERALS) TABS tablet Take 1 tablet  by mouth daily. For low Vitamin   03/02/2017 at Unknown time  . sertraline (ZOLOFT) 50 MG tablet Take 1 tablet (50 mg total) by mouth daily. For depression 30 tablet 0 03/02/2017 at Unknown time  . hydrOXYzine (ATARAX/VISTARIL) 50 MG tablet Take 1 tablet (50 mg total) by mouth at bedtime as needed (sleep). Anxiety (Patient not taking: Reported on 03/02/2017) 30 tablet 0 Not Taking at Unknown time  . nicotine (NICODERM CQ - DOSED IN MG/24 HOURS) 21 mg/24hr patch Place 1 patch (21 mg total) onto the skin daily. For smoking cessation (Patient not taking: Reported on 03/02/2017) 28 patch 0 Not Taking at Unknown time    Patient Stressors: Financial difficulties Substance abuse  Patient Strengths: Ability for insight Average or above average intelligence  Treatment Modalities: Medication Management, Group therapy, Case management,  1 to 1  session with clinician, Psychoeducation, Recreational therapy.   Physician Treatment Plan for Primary Diagnosis: Severe recurrent major depression without psychotic features ([place]) Long Term Goal(s): Improvement in symptoms so as ready for discharge Improvement in symptoms so as ready for discharge   Short Term Goals: Ability to identify changes in lifestyle to reduce recurrence of condition will improve Ability to verbalize feelings will improve Ability to disclose and discuss suicidal ideas Ability to demonstrate self-control will improve Ability to identify and develop effective coping behaviors will improve Ability to maintain clinical measurements within normal limits will improve Compliance with prescribed medications will improve Ability to identify triggers associated with substance abuse/mental health issues will improve Ability to identify changes in lifestyle to reduce recurrence of condition will improve Ability to demonstrate self-control will improve Ability to identify triggers associated with substance abuse/mental health issues will improve  Medication Management: Evaluate patient's response, side effects, and tolerance of medication regimen.  Therapeutic Interventions: 1 to 1 sessions, Unit Group sessions and Medication administration.  Evaluation of Outcomes: Progressing  Physician Treatment Plan for Secondary Diagnosis: Principal Problem:   Severe recurrent major depression without psychotic features ([place]) Active Problems:   Tobacco use disorder   Alcohol withdrawal ([facility])   Alcohol use disorder, severe, dependence ([place])   Alcohol-induced mood disorder ([place])   Cannabis use disorder, moderate, dependence ([place])  Long Term Goal(s): Improvement in symptoms so as ready for discharge Improvement in symptoms so as ready for discharge   Short Term Goals: Ability to identify changes in lifestyle to reduce recurrence of condition will improve Ability to verbalize  feelings will improve Ability to disclose and discuss suicidal ideas Ability to demonstrate self-control will improve Ability to identify and develop effective coping behaviors will improve Ability to maintain clinical measurements within normal limits will improve Compliance with prescribed medications will improve Ability to identify triggers associated with substance abuse/mental health issues will improve Ability to identify changes in lifestyle to reduce recurrence of condition will improve Ability to demonstrate self-control will improve Ability to identify triggers associated with substance abuse/mental health issues will improve     Medication Management: Evaluate patient's response, side effects, and tolerance of medication regimen.  Therapeutic Interventions: 1 to 1 sessions, Unit Group sessions and Medication administration.  Evaluation of Outcomes: Progressing   RN Treatment Plan for Primary Diagnosis: Severe recurrent major depression without psychotic features ([place]) Long Term Goal(s): Knowledge of disease and therapeutic regimen to maintain health will improve  Short Term Goals: Ability to demonstrate self-control and Ability to identify and develop effective coping behaviors will improve  Medication Management: RN will administer medications as ordered by provider, will assess  and evaluate patient's response and provide education to patient for prescribed medication. RN will report any adverse and/or side effects to prescribing provider.  Therapeutic Interventions: 1 on 1 counseling sessions, Psychoeducation, Medication administration, Evaluate responses to treatment, Monitor vital signs and CBGs as ordered, Perform/monitor CIWA, COWS, AIMS and Fall Risk screenings as ordered, Perform wound care treatments as ordered.  Evaluation of Outcomes: Progressing   LCSW Treatment Plan for Primary Diagnosis: Severe recurrent major depression without psychotic features ([place]) Long  Term Goal(s): Safe transition to appropriate next level of care at discharge, Engage patient in therapeutic group addressing interpersonal concerns.  Short Term Goals: Engage patient in aftercare planning with referrals and resources  Therapeutic Interventions: Assess for all discharge needs, 1 to 1 time with Social worker, Explore available resources and support systems, Assess for adequacy in community support network, Educate family and significant other(s) on suicide prevention, Complete Psychosocial Assessment, Interpersonal group therapy.  Evaluation of Outcomes: Progressing    Recreational Therapy Treatment Plan for Primary Diagnosis: Severe recurrent major depression without psychotic features ([place]) Long Term Goal(s): Patient will participate in recreation therapy treatment in at least 2 group sessions without prompting from LRT  Short Term Goals: Increase self-esteem, Increase healthy coping skills  Treatment Modalities: Group Therapy, Individual Treatment Sessions  Therapeutic Interventions: Psychoeducation  Evaluation of Outcomes: Progressing   Progress in Treatment: Attending groups: No. Participating in groups: No. Taking medication as prescribed: Yes. Toleration medication: Yes. Family/Significant other contact made: No, will contact:  CSW assessing proper contacts. Patient understands diagnosis: Yes. Discussing patient identified problems/goals with staff: Yes. Medical problems stabilized or resolved: Yes. Denies suicidal/homicidal ideation: Yes. Issues/concerns per patient self-inventory: No.  New problem(s) identified: Yes, Describe:  alcoholism  New Short Term/Long Term Goal(s): Pt's goal is to feel better mentally and physically.  Discharge Plan or Barriers: CSW still assessing follow-up plans.  Reason for Continuation of Hospitalization: Depression Suicidal ideation Withdrawal symptoms  Estimated Length of Stay: 2-3 days   Attendees: Patient: Darrell [name] 03/03/2017 10:49 AM  Physician: Dr. Orson Slick, MD 03/03/2017 10:49 AM  Nursing: Elige Radon, BSN, RN 03/03/2017 10:49 AM  RN Care Manager: 03/03/2017 10:49 AM  Social Worker: Glorious Peach, MSW, LCSW-A 03/03/2017 10:49 AM  Recreational Therapist: Drue Flirt, LRT/CTRS  03/03/2017 10:49 AM  Other:  03/03/2017 10:49 AM  Other:  03/03/2017 10:49 AM  Other: 03/03/2017 10:49 AM    Scribe for Treatment Team: Emilie Rutter, [place] 03/03/2017 10:49 AM
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
## 2017-03-03 NOTE — Tx Team (Signed)
Initial Treatment Plan 03/03/2017 12:41 AM Darrell [name] JSH:[id number]    PATIENT STRESSORS: Financial difficulties Substance abuse   PATIENT STRENGTHS: Ability for insight Average or above average intelligence   PATIENT IDENTIFIED PROBLEMS: Substance abuse  suicidal ideation                   DISCHARGE CRITERIA:  Ability to meet basic life and health needs  PRELIMINARY DISCHARGE PLAN: Attend 12-step recovery group  PATIENT/FAMILY INVOLVEMENT: This treatment plan has been presented to and reviewed with the patient, Darrell [name], and/or family member.  The patient and family have been given the opportunity to ask questions and make suggestions.  Aleen Campi, RN 03/03/2017, 12:41 AM

## 2017-03-03 NOTE — Progress Notes (Signed)
Recreation Therapy Notes  Date: 03.09.18 Time: 1:00 pm Location: Craft Room  Group Topic: Social Skills  Goal Area(s) Addresses:  Patient will effectively work with peer towards shared goal. Patient will identify skills used to make activity successful. Patient will identify benefit of using group skills effectively post d/c.  Behavioral Response: Did not attend  Intervention: [employer]  Activity: Patients were put in groups and given 15 pipe cleaners. Patients were instructed to build a free standing tower with all 15 pipe cleaners. Patients were given 2 minutes to strategize. After about 5 minutes of building, patient were instructed to put their dominant hand behind their backs. After about another 5 minutes of building, patients were instructed to stop talking to each other.  Education: LRT educated patients on healthy support systems.  Education Outcome: Patient did not attend group.  Clinical Observations/Feedback: Patient did not attend group.  Leonette Monarch, LRT/CTRS 03/03/2017 1:55 PM

## 2017-03-04 LAB — HEMOGLOBIN A1C
HEMOGLOBIN A1C: 5.4 % (ref 4.8–5.6)
MEAN PLASMA GLUCOSE: 108 mg/dL

## 2017-03-04 LAB — PROLACTIN: Prolactin: 12.7 ng/mL (ref 4.0–15.2)

## 2017-03-04 MED ORDER — TRAZODONE HCL 50 MG PO TABS
150.0000 mg | ORAL_TABLET | Freq: Every day | ORAL | Status: DC
  Administered 2017-03-05: 150 mg via ORAL
  Administered 2017-03-07: 50 mg via ORAL
  Filled 2017-03-04 (×5): qty 1

## 2017-03-04 MED ORDER — IBUPROFEN 600 MG PO TABS
600.0000 mg | ORAL_TABLET | Freq: Four times a day (QID) | ORAL | Status: DC | PRN
  Administered 2017-03-04 – 2017-03-06 (×2): 600 mg via ORAL
  Filled 2017-03-04 (×4): qty 1

## 2017-03-04 MED ORDER — ACETAMINOPHEN 500 MG PO TABS
1000.0000 mg | ORAL_TABLET | Freq: Four times a day (QID) | ORAL | Status: DC | PRN
  Administered 2017-03-05: 1000 mg via ORAL
  Filled 2017-03-04: qty 2

## 2017-03-04 MED ORDER — LORAZEPAM 2 MG PO TABS
2.0000 mg | ORAL_TABLET | Freq: Once | ORAL | Status: AC
  Administered 2017-03-04: 2 mg via ORAL
  Filled 2017-03-04: qty 1

## 2017-03-04 NOTE — Progress Notes (Signed)
First am patient remained in his room in bed.  Verbalizes that does not feel that well.  Up after lunch stating that he felt much better.  Up to dayroom some.  Attend NA meeting.  No inappropriate behavior noted.  Medicated x1 for pain.  Support and encouragement offered.  Safety maintained.
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
## 2017-03-04 NOTE — BHH Group Notes (Signed)
[place] LCSW Group Therapy  03/04/2017 3:31 PM  Type of Therapy:  Group Therapy  Participation Level:  Patient did not attend group. CSW invited patient to group.   Summary of Progress/Problems: Goal Setting: The objective is to set goals as they relate to the crisis in which they were admitted. Patients will be using SMART goal modalities to set measurable goals. Characteristics of realistic goals will be discussed and patients will be assisted in setting and processing how one will reach their goal. Facilitator will also assist patients in applying interventions and coping skills learned in psycho-education groups to the SMART goal and process how one will achieve defined goal.  Callen Zuba G. [place], [place] 03/04/2017, 3:31 PM

## 2017-03-04 NOTE — BHH Group Notes (Signed)
[facility] Group Notes:  (Nursing/MHT/Case Management/Adjunct)  Date:  03/04/2017  Time:  2:45 AM  Type of Therapy:  Psychoeducational Skills  Participation Level:  Did Not Attend  Summary of Progress/Problems:  Darrell [name] 03/04/2017, 2:45 AM

## 2017-03-04 NOTE — Progress Notes (Signed)
D: Pt denies SI/HI/AVH. Pt is pleasant and cooperative, affect is flat and sad, patient is irritable and angry using profanities, refused nursing care and he stated " get out of my room l don't need any care". Pt appears anxious and he is not  interacting with peers and staff appropriately.  A: Pt was offered support and encouragement. Pt was offered medications. Pt was encouraged to attend groups. Q 15 minute checks were done for safety.  R:Pt did not attend groups. Pt refused medication. Pt is not receptive to treatment. Safety maintained on unit., will continue to monitor.

## 2017-03-05 MED ORDER — GABAPENTIN 400 MG PO CAPS
800.0000 mg | ORAL_CAPSULE | Freq: Three times a day (TID) | ORAL | Status: DC
  Administered 2017-03-05 – 2017-03-07 (×5): 800 mg via ORAL
  Filled 2017-03-05 (×5): qty 2

## 2017-03-05 NOTE — BHH Group Notes (Signed)
[place] [facility] LCSW Group Therapy  03/05/2017 2:48 PM  Type of Therapy:  Group Therapy  Participation Level:  Patient did not attend group. CSW invited patient to group.   Summary of Progress/Problems: Communications: Patients identify how individuals communicate with one another appropriately and inappropriately. Patients will be guided to discuss their thoughts, feelings, and behaviors related to barriers when communicating. The group will process together ways to execute positive and appropriate communications.   Emalie Mcwethy G. [place], [place] 03/05/2017, 2:48 PM
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
## 2017-03-05 NOTE — BHH Group Notes (Signed)
[facility] Group Notes:  (Nursing/MHT/Case Management/Adjunct)  Date:  03/05/2017  Time:  4:00 AM  Type of Therapy:  Psychoeducational Skills  Participation Level:  Active  Participation Quality:  Appropriate, Attentive and Sharing  Affect:  Appropriate  Cognitive:  Appropriate  Insight:  Appropriate  Engagement in Group:  Engaged  Modes of Intervention:  Discussion, Socialization and Support  Summary of Progress/Problems:  Reece Agar 03/05/2017, 4:00 AM
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
## 2017-03-05 NOTE — Progress Notes (Signed)
Cooperative with treatment, he spent most of the evening in the dayroom with peers. He was medication compliant on shift. He complained of breaking his right pinky toe due to not being able to have his heavy duty boots. Patient was informed of unit policy and also MD was called to be notified of patient's complaint. Patient medication was offered but patient only wanted Ibuprofen and it was not time for it.  Patient appears to be resting in bed quietly.

## 2017-03-06 MED ORDER — LISINOPRIL 5 MG PO TABS
5.0000 mg | ORAL_TABLET | Freq: Every day | ORAL | Status: DC
  Filled 2017-03-06: qty 1

## 2017-03-06 NOTE — Progress Notes (Signed)
D: Pt denies SI/HI/AVH. Pt is pleasant and cooperative. Pt stated he was feeling better .  A: Pt was offered support and encouragement. Pt was given scheduled medications. Pt was encourage to attend groups. Q 15 minute checks were done for safety.   R:Pt attends groups and interacts well with peers and staff. Pt is taking medication .Pt receptive to treatment and safety maintained on unit.

## 2017-03-06 NOTE — Plan of Care (Signed)
Problem: Safety: Goal: Ability to remain free from injury will improve Outcome: Progressing Pt safe on the unit at this time   

## 2017-03-06 NOTE — Progress Notes (Signed)
Patient irritable this am. Reports sleep med made him feel too drowsy. Pt request Ativan instead of librium. Denies SI, HI, AVH. Pt with  Mild withdrawal symptoms.  Med and group compliant. Appropriate with staff and peers Encouragement and support offered. Safety checks maintained. Pt receptive and remains safe on unit with q 15 min checks.
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
## 2017-03-06 NOTE — BHH Group Notes (Signed)
[facility] Group Notes:  (Nursing/MHT/Case Management/Adjunct)  Date:  03/06/2017  Time:  11:56 PM  Type of Therapy:  Psychoeducational Skills  Participation Level:  Active  Participation Quality:  Appropriate  Affect:  Appropriate  Cognitive:  Alert  Insight:  Good  Engagement in Group:  Supportive  Modes of Intervention:  Activity  Summary of Progress/Problems:  Darrell [name] 03/06/2017, 11:56 PM
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
## 2017-03-06 NOTE — BHH Group Notes (Signed)
[facility] Group Notes:  (Nursing/MHT/Case Management/Adjunct)  Date:  03/06/2017  Time:  3:31 AM  Type of Therapy:  Group Therapy  Participation Level:  Did Not Attend   Marylynn Pearson 03/06/2017, 3:31 AM

## 2017-03-06 NOTE — Plan of Care (Signed)
Problem: Safety: Goal: Ability to remain free from injury will improve Outcome: Progressing No injuries reported or observed   

## 2017-03-06 NOTE — BHH Group Notes (Signed)
[place] [facility] Group Notes:  (Nursing/MHT/Case Management/Adjunct)  Date:  03/06/2017  Time:  4:25 PM  Type of Therapy:  Psychoeducational Skills  Participation Level:  Minimal  Participation Quality:  Intrusive and Monopolizing  Affect:  Appropriate  Cognitive:  Appropriate  Insight:  Improving  Engagement in Group:  Monopolizing and Off Topic  Modes of Intervention:  Discussion and Education  Summary of Progress/Problems:  Charise Killian 03/06/2017, 4:25 PM

## 2017-03-06 NOTE — Tx Team (Signed)
Interdisciplinary Treatment and Diagnostic Plan Update  03/06/2017 Time of Session: 10:30 AM Darrell [name] MRN: [id number]  Principal Diagnosis: Severe recurrent major depression without psychotic features [facility])  Secondary Diagnoses: Principal Problem:   Severe recurrent major depression without psychotic features ([place]) Active Problems:   Tobacco use disorder   Alcohol withdrawal ([place])   Alcohol use disorder, severe, dependence ([place])   Alcohol-induced mood disorder ([place])   Cannabis use disorder, moderate, dependence ([place])   Cocaine use disorder, moderate, dependence ([place])   Current Medications:  Current Facility-Administered Medications  Medication Dose Route Frequency Provider Last Rate Last Dose  . acetaminophen (TYLENOL) tablet 1,000 mg  1,000 mg Oral Q6H PRN Hildred Priest, MD   1,000 mg at 03/05/17 1621  . alum & mag hydroxide-simeth (MAALOX/MYLANTA) 200-200-20 MG/5ML suspension 30 mL  30 mL Oral Q4H PRN Gonzella Lex, MD      . aspirin EC tablet 81 mg  81 mg Oral Daily Gonzella Lex, MD   81 mg at 03/06/17 0758  . chlordiazePOXIDE (LIBRIUM) capsule 50 mg  50 mg Oral QID Clovis Fredrickson, MD   50 mg at 03/06/17 0758  . gabapentin (NEURONTIN) capsule 800 mg  800 mg Oral TID Hildred Priest, MD   800 mg at 03/06/17 0758  . ibuprofen (ADVIL,MOTRIN) tablet 600 mg  600 mg Oral Q6H PRN Hildred Priest, MD   600 mg at 03/04/17 1548  . [START ON 03/07/2017] lisinopril (PRINIVIL,ZESTRIL) tablet 5 mg  5 mg Oral Daily Jolanta B Pucilowska, MD      . magnesium hydroxide (MILK OF MAGNESIA) suspension 30 mL  30 mL Oral Daily PRN Gonzella Lex, MD      . nicotine (NICODERM CQ - dosed in mg/24 hours) patch 21 mg  21 mg Transdermal Daily Jolanta B Pucilowska, MD   21 mg at 03/06/17 0758  . ondansetron (ZOFRAN) tablet 4 mg  4 mg Oral Q8H PRN Jolanta B Pucilowska, MD      . pantoprazole (PROTONIX) EC tablet 40 mg  40 mg Oral Daily Jolanta B Pucilowska, MD    40 mg at 03/06/17 0758  . sertraline (ZOLOFT) tablet 50 mg  50 mg Oral Daily Gonzella Lex, MD   50 mg at 03/06/17 0758  . thiamine (VITAMIN B-1) tablet 100 mg  100 mg Oral Daily Gonzella Lex, MD   100 mg at 03/06/17 0758  . traZODone (DESYREL) tablet 150 mg  150 mg Oral QHS Hildred Priest, MD   150 mg at 03/05/17 2259   PTA Medications: Prescriptions Prior to Admission  Medication Sig Dispense Refill Last Dose  . aspirin 81 MG EC tablet Take 1 tablet (81 mg total) by mouth daily. For heart health 1 tablet 0 03/02/2017 at Unknown time  . atorvastatin (LIPITOR) 80 MG tablet Take 1 tablet (80 mg total) by mouth daily. For high cholesterol 30 tablet 0 03/02/2017 at Unknown time  . gabapentin (NEURONTIN) 400 MG capsule Take 2 capsules (800 mg total) by mouth 3 (three) times daily. For agitation/substance withdrawal symptoms 180 capsule 0 03/02/2017 at Unknown time  . lisinopril (PRINIVIL,ZESTRIL) 20 MG tablet Take 1 tablet (20 mg total) by mouth daily. For high blood pressure 30 tablet 0 03/02/2017 at Unknown time  . Multiple Vitamin (MULTIVITAMIN WITH MINERALS) TABS tablet Take 1 tablet by mouth daily. For low Vitamin   03/02/2017 at Unknown time  . sertraline (ZOLOFT) 50 MG tablet Take 1 tablet (50 mg total) by mouth daily. For  depression 30 tablet 0 03/02/2017 at Unknown time  . hydrOXYzine (ATARAX/VISTARIL) 50 MG tablet Take 1 tablet (50 mg total) by mouth at bedtime as needed (sleep). Anxiety (Patient not taking: Reported on 03/02/2017) 30 tablet 0 Not Taking at Unknown time  . nicotine (NICODERM CQ - DOSED IN MG/24 HOURS) 21 mg/24hr patch Place 1 patch (21 mg total) onto the skin daily. For smoking cessation (Patient not taking: Reported on 03/02/2017) 28 patch 0 Not Taking at Unknown time    Patient Stressors: Financial difficulties Substance abuse  Patient Strengths: Ability for insight Average or above average intelligence  Treatment Modalities: Medication Management, Group therapy, Case  management,  1 to 1 session with clinician, Psychoeducation, Recreational therapy.   Physician Treatment Plan for Primary Diagnosis: Severe recurrent major depression without psychotic features ([place]) Long Term Goal(s): Improvement in symptoms so as ready for discharge Improvement in symptoms so as ready for discharge   Short Term Goals: Ability to identify changes in lifestyle to reduce recurrence of condition will improve Ability to verbalize feelings will improve Ability to disclose and discuss suicidal ideas Ability to demonstrate self-control will improve Ability to identify and develop effective coping behaviors will improve Ability to maintain clinical measurements within normal limits will improve Compliance with prescribed medications will improve Ability to identify triggers associated with substance abuse/mental health issues will improve Ability to identify changes in lifestyle to reduce recurrence of condition will improve Ability to demonstrate self-control will improve Ability to identify triggers associated with substance abuse/mental health issues will improve  Medication Management: Evaluate patient's response, side effects, and tolerance of medication regimen.  Therapeutic Interventions: 1 to 1 sessions, Unit Group sessions and Medication administration.  Evaluation of Outcomes: Progressing  Physician Treatment Plan for Secondary Diagnosis: Principal Problem:   Severe recurrent major depression without psychotic features ([place]) Active Problems:   Tobacco use disorder   Alcohol withdrawal ([facility])   Alcohol use disorder, severe, dependence ([facility])   Alcohol-induced mood disorder ([facility])   Cannabis use disorder, moderate, dependence ([facility])   Cocaine use disorder, moderate, dependence ([place])  Long Term Goal(s): Improvement in symptoms so as ready for discharge Improvement in symptoms so as ready for discharge   Short Term Goals: Ability to identify changes in lifestyle to  reduce recurrence of condition will improve Ability to verbalize feelings will improve Ability to disclose and discuss suicidal ideas Ability to demonstrate self-control will improve Ability to identify and develop effective coping behaviors will improve Ability to maintain clinical measurements within normal limits will improve Compliance with prescribed medications will improve Ability to identify triggers associated with substance abuse/mental health issues will improve Ability to identify changes in lifestyle to reduce recurrence of condition will improve Ability to demonstrate self-control will improve Ability to identify triggers associated with substance abuse/mental health issues will improve     Medication Management: Evaluate patient's response, side effects, and tolerance of medication regimen.  Therapeutic Interventions: 1 to 1 sessions, Unit Group sessions and Medication administration.  Evaluation of Outcomes: Progressing   RN Treatment Plan for Primary Diagnosis: Severe recurrent major depression without psychotic features ([place]) Long Term Goal(s): Knowledge of disease and therapeutic regimen to maintain health will improve  Short Term Goals: Ability to demonstrate self-control and Ability to identify and develop effective coping behaviors will improve  Medication Management: RN will administer medications as ordered by provider, will assess and evaluate patient's response and provide education to patient for prescribed medication. RN will report any adverse and/or side effects to  prescribing provider.  Therapeutic Interventions: 1 on 1 counseling sessions, Psychoeducation, Medication administration, Evaluate responses to treatment, Monitor vital signs and CBGs as ordered, Perform/monitor CIWA, COWS, AIMS and Fall Risk screenings as ordered, Perform wound care treatments as ordered.  Evaluation of Outcomes: Progressing   LCSW Treatment Plan for Primary Diagnosis: Severe  recurrent major depression without psychotic features ([place]) Long Term Goal(s): Safe transition to appropriate next level of care at discharge, Engage patient in therapeutic group addressing interpersonal concerns.  Short Term Goals: Engage patient in aftercare planning with referrals and resources  Therapeutic Interventions: Assess for all discharge needs, 1 to 1 time with Social worker, Explore available resources and support systems, Assess for adequacy in community support network, Educate family and significant other(s) on suicide prevention, Complete Psychosocial Assessment, Interpersonal group therapy.  Evaluation of Outcomes: Progressing    Recreational Therapy Treatment Plan for Primary Diagnosis: Severe recurrent major depression without psychotic features ([place]) Long Term Goal(s): Patient will participate in recreation therapy treatment in at least 2 group sessions without prompting from LRT  Short Term Goals: Increase self-esteem, Increase healthy coping skills  Treatment Modalities: Group Therapy, Individual Treatment Sessions  Therapeutic Interventions: Psychoeducation  Evaluation of Outcomes: Progressing   Progress in Treatment: Attending groups: No. Participating in groups: No. Taking medication as prescribed: Yes. Toleration medication: Yes. Family/Significant other contact made: No, will contact:  CSW assessing proper contacts. Patient understands diagnosis: Yes. Discussing patient identified problems/goals with staff: Yes. Medical problems stabilized or resolved: Yes. Denies suicidal/homicidal ideation: Yes. Issues/concerns per patient self-inventory: No.  New problem(s) identified: Yes, Describe:  alcoholism  New Short Term/Long Term Goal(s): Pt's goal is to feel better mentally and physically.  Discharge Plan or Barriers: CSW still assessing follow-up plans.  Reason for Continuation of Hospitalization: Depression Suicidal ideation Withdrawal  symptoms  Estimated Length of Stay: 2 days   Attendees: Patient: Darrell [name] 03/06/2017 11:00 AM  Physician: Dr. Orson Slick, MD 03/06/2017 11:00 AM  Nursing: Floyde Parkins RN 03/06/2017 11:00 AM  RN Care Manager: 03/06/2017 11:00 AM  Social Worker: Glorious Peach, MSW, LCSW-A 03/06/2017 11:00 AM  Recreational Therapist: Drue Flirt, LRT/CTRS  03/06/2017 11:00 AM  Other:  03/06/2017 11:00 AM  Other:  03/06/2017 11:00 AM  Other: 03/06/2017 11:00 AM    Scribe for Treatment Team: Emilie Rutter, [place] 03/06/2017 11:00 AM

## 2017-03-06 NOTE — B[facility] Suicide Risk Assessment (Signed)
[place] INPATIENT:  Family/Significant Other Suicide Prevention Education  Suicide Prevention Education:  Patient Refusal for Family/Significant Other Suicide Prevention Education: The patient Darrell [name] has refused to provide written consent for family/significant other to be provided Family/Significant Other Suicide Prevention Education during admission and/or prior to discharge.  Physician notified.  Emilie Rutter, MSW, LCSW-A 03/06/2017, 11:46 AM

## 2017-03-06 NOTE — B[facility] Group Notes (Signed)
[place] LCSW Group Therapy Note  Date/Time: 03/06/17, 0930  Type of Therapy and Topic:  Group Therapy:  Overcoming Obstacles  Participation Level:  Pt did not attend group.  Description of Group:    In this group patients will be encouraged to explore what they see as obstacles to their own wellness and recovery. They will be guided to discuss their thoughts, feelings, and behaviors related to these obstacles. The group will process together ways to cope with barriers, with attention given to specific choices patients can make. Each patient will be challenged to identify changes they are motivated to make in order to overcome their obstacles. This group will be process-oriented, with patients participating in exploration of their own experiences as well as giving and receiving support and challenge from other group members.  Therapeutic Goals: 1. Patient will identify personal and current obstacles as they relate to admission. 2. Patient will identify barriers that currently interfere with their wellness or overcoming obstacles.  3. Patient will identify feelings, thought process and behaviors related to these barriers. 4. Patient will identify two changes they are willing to make to overcome these obstacles:    Summary of Patient Progress      Therapeutic Modalities:   Cognitive Behavioral Therapy Solution Focused Therapy Motivational Interviewing Relapse Prevention Therapy  Lurline Idol, LCSW
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
## 2017-03-07 MED ORDER — ATORVASTATIN CALCIUM 20 MG PO TABS
80.0000 mg | ORAL_TABLET | Freq: Every day | ORAL | Status: DC
  Administered 2017-03-07: 80 mg via ORAL
  Filled 2017-03-07: qty 4

## 2017-03-07 MED ORDER — TRAZODONE HCL 100 MG PO TABS: 150.0000 mg | ORAL_TABLET | Freq: Every evening | ORAL | Status: DC | PRN

## 2017-03-07 MED ORDER — SERTRALINE HCL 50 MG PO TABS: 50.0000 mg | ORAL_TABLET | Freq: Every day | ORAL | 1 refills | Status: DC

## 2017-03-07 MED ORDER — PANTOPRAZOLE SODIUM 40 MG PO TBEC: 40.0000 mg | DELAYED_RELEASE_TABLET | Freq: Every day | ORAL | 1 refills | Status: DC

## 2017-03-07 MED ORDER — GABAPENTIN 400 MG PO CAPS
800.0000 mg | ORAL_CAPSULE | Freq: Three times a day (TID) | ORAL | Status: DC
  Administered 2017-03-07 – 2017-03-08 (×3): 800 mg via ORAL
  Filled 2017-03-07 (×3): qty 2

## 2017-03-07 MED ORDER — HYDROXYZINE HCL 25 MG PO TABS
25.0000 mg | ORAL_TABLET | Freq: Once | ORAL | Status: AC
  Administered 2017-03-07: 25 mg via ORAL
  Filled 2017-03-07: qty 1

## 2017-03-07 MED ORDER — ATORVASTATIN CALCIUM 80 MG PO TABS: 80.0000 mg | ORAL_TABLET | Freq: Every day | ORAL | 1 refills | Status: DC

## 2017-03-07 MED ORDER — ASPIRIN 81 MG PO TBEC: 81.0000 mg | DELAYED_RELEASE_TABLET | Freq: Every day | ORAL | 1 refills | Status: DC

## 2017-03-07 MED ORDER — ADULT MULTIVITAMIN W/MINERALS CH: 1.0000 | ORAL_TABLET | Freq: Every day | ORAL | 1 refills | Status: DC

## 2017-03-07 MED ORDER — GABAPENTIN 400 MG PO CAPS: 800.0000 mg | ORAL_CAPSULE | Freq: Three times a day (TID) | ORAL | 0 refills | Status: DC

## 2017-03-07 NOTE — Progress Notes (Signed)
D. Pt has been pleasant, calm and cooperative. His only complaint has been his being 'groggy' from his sleep medication taken last pm. Pt currently denies SI/HI and AVH and agrees to contact staff before acting on any harmful thoughts.  A. Labs and vitals monitored. Pt compliant with meds. Pt supported emotionally and encouraged to express concerns and ask questions.   R. Pt remains safe with 15 minute checks. Will continue POC.

## 2017-03-07 NOTE — Plan of Care (Signed)
Problem: [facility] Participation in Recreation Therapeutic Interventions Goal: STG-Patient will demonstrate improved self esteem by identif STG: Self-Esteem - Within 4 treatment sessions, patient will verbalize at least 5 positive affirmation statements in each of 2 treatment sessions to increase self-esteem.  Outcome: Progressing Treatment Session 2; Completed 1 out of 2: At approximately 4:20 pm, LRT met with patient in consultation room. Patient verbalized 5 positive affirmation statements. Patient reported it felt "good". LRT encouraged patient to continue saying positive affirmation statements.  Leonette Monarch, LRT/CTRS 03.13.18 4:49 pm Goal: STG-Patient will identify at least five coping skills for ** STG: Coping Skills - Within 4 treatment sessions, patient will verbalize at least 5 coping skills for substance abuse in each of 2 treatment sessions to decrease substance abuse.  Outcome: Progressing Treatment Session 2; Completed 1 out of 2: At approximately 4:20 pm, LRT met with patient in consultation room. Patient verbalized 5 coping skills for substance abuse. LRT educated patient on leisure and why it is important to implement into his schedule. LRT educated and provided patient with blank schedules to help him plan his day and try to avoid using substances. LRT educated patient on healthy support systems.  Leonette Monarch, LRT/CTRS 03.13.18 4:50 pm

## 2017-03-07 NOTE — B[facility] Group Notes (Signed)
[place] LCSW Group Therapy Note  Date/Time: 03/07/17, 0930  Type of Therapy/Topic:  Group Therapy:  Feelings about Diagnosis  Participation Level:  Did Not Attend   Mood:   Description of Group:    This group will allow patients to explore their thoughts and feelings about diagnoses they have received. Patients will be guided to explore their level of understanding and acceptance of these diagnoses. Facilitator will encourage patients to process their thoughts and feelings about the reactions of others to their diagnosis, and will guide patients in identifying ways to discuss their diagnosis with significant others in their lives. This group will be process-oriented, with patients participating in exploration of their own experiences as well as giving and receiving support and challenge from other group members.   Therapeutic Goals: 1. Patient will demonstrate understanding of diagnosis as evidence by identifying two or more symptoms of the disorder:  2. Patient will be able to express two feelings regarding the diagnosis 3. Patient will demonstrate ability to communicate their needs through discussion and/or role plays  Summary of Patient Progress:        Therapeutic Modalities:   Cognitive Behavioral Therapy Brief Therapy Feelings Identification   Lurline Idol, LCSW

## 2017-03-07 NOTE — Plan of Care (Signed)
Problem: Pain Managment: Goal: General experience of comfort will improve Outcome: Not Met (add Reason) Pt remains anxious. C/o right foot pain. Ibuprofen 600 mg po given  PRN at 2025 for pain. Pain mgmt regime maintained for comfort. No behavior issues noted. Safety maintained with q 15 min checks.

## 2017-03-07 NOTE — BHH Group Notes (Signed)
Goals Group Date/Time: 03/07/2017 9:00 AM Type of Therapy and Topic: Group Therapy: Goals Group: SMART Goals   Participation Level: Moderate  Description of Group:    The purpose of a daily goals group is to assist and guide patients in setting recovery/wellness-related goals. The objective is to set goals as they relate to the crisis in which they were admitted. Patients will be using SMART goal modalities to set measurable goals. Characteristics of realistic goals will be discussed and patients will be assisted in setting and processing how one will reach their goal. Facilitator will also assist patients in applying interventions and coping skills learned in psycho-education groups to the SMART goal and process how one will achieve defined goal.   Therapeutic Goals:   -Patients will develop and document one goal related to or their crisis in which brought them into treatment.  -Patients will be guided by LCSW using SMART goal setting modality in how to set a measurable, attainable, realistic and time sensitive goal.  -Patients will process barriers in reaching goal.  -Patients will process interventions in how to overcome and successful in reaching goal.   Patient's Goal: Pt participated in workbook activity where he described his long term area of recovery as to be abstinent from alcohol.  His goal for today was "don't drink" and CSW also discussed with him the need to work on discharge plans, as he reports he will be discharging tomorrow.   Therapeutic Modalities:  Motivational Interviewing  Art gallery manager  SMART goals setting   Lurline Idol, [place]
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
## 2017-03-07 NOTE — Progress Notes (Signed)
Recreation Therapy Notes  Date: 03.13.18 Time: 3:00 pm Location: Craft Room  Group Topic: Self-expression  Goal Area(s) Addresses:  Patient will be able to identify a color that represents each emotion. Patient will verbalize benefit of using art as a means of self-expression. Patient will verbalize one emotion experienced while participating in activity.  Behavioral Response: Attentive, Interactive, Disruptive  Intervention: The Colors Within Me  Activity: Patients were given a blank face worksheet and were instructed to pick a color for each emotion they were feeling and show on the worksheet how much of that emotion they were feeling.  Education: LRT educated patients on other forms of self-expression.  Education Outcome: In group clarification offered  Clinical Observations/Feedback: Patient picked a color for the emotions he was feeling. Patient contributed to group discussion by stating what emotions he was feeling. Patient had to be redirected multiple times for interrupting other patient and for getting off topic.  Leonette Monarch, LRT/CTRS 03/07/2017 3:59 PM

## 2017-03-08 NOTE — Progress Notes (Signed)
Recreation Therapy Notes  Date: 03.14.18 Time: 1:00 pm Location: Craft Room  Group Topic: Self-esteem  Goal Area(s) Addresses:  Patient will write at least one positive trait about self. Patient will verbalize benefit of having healthy self-esteem.  Behavioral Response: Attentive, Left early  Intervention: I Am  Activity: Patients were given a worksheet with the letter I on it and were instructed to write as many positive traits about themselves inside the letter.  Education: LRT educated patients on ways they can increase their self-esteem.  Education Outcome: Patient left before LRT educated group.  Clinical Observations/Feedback: Patient arrived to group at approximately 1:17 pm. LRT explained activity. Patient initially wrote "I am" and that was it. LRT encouraged patient to wrote positive traits. Patient eventually wrote positive traits. Patient left group at approximately 1:30 pm and did not return to group.  Leonette Monarch, LRT/CTRS 03/08/2017 2:01 PM

## 2017-03-08 NOTE — Progress Notes (Signed)
Recreation Therapy Notes  INPATIENT RECREATION TR PLAN  Patient Details Name: Darrell [name] MRN: [id number] DOB: [birth year] Today's Date: 03/08/2017  Rec Therapy Plan Is patient appropriate for Therapeutic Recreation?: Yes Treatment times per week: At least once a week TR Treatment/Interventions: 1:1 session, Group participation (Comment) (Appropriate participation in daily recreational therapy tx)  Discharge Criteria Pt will be discharged from therapy if:: Treatment goals are met, Discharged Treatment plan/goals/alternatives discussed and agreed upon by:: Patient/family  Discharge Summary Short term goals set: See Care Plan Short term goals met: Complete Progress toward goals comments: One-to-one attended Which groups?: Wellness, Self-esteem, Other (Comment) (Self-expression) One-to-one attended: Self-esteem, coping skills Reason goals not met: N/A Therapeutic equipment acquired: None Reason patient discharged from therapy: Discharge from hospital Pt/family agrees with progress & goals achieved: Yes Date patient discharged from therapy: 03/08/17   Leonette Monarch, LRT/CTRS 03/08/2017, 2:39 PM

## 2017-03-08 NOTE — Tx Team (Signed)
Interdisciplinary Treatment and Diagnostic Plan Update  03/08/2017 Time of Session: 10:30 AM Darrell [name] MRN: [id number]  Principal Diagnosis: Severe recurrent major depression without psychotic features [facility])  Secondary Diagnoses: Principal Problem:   Severe recurrent major depression without psychotic features ([place]) Active Problems:   Tobacco use disorder   Alcohol withdrawal ([place])   Alcohol use disorder, severe, dependence ([place])   Alcohol-induced mood disorder ([place])   Cannabis use disorder, moderate, dependence ([place])   Cocaine use disorder, moderate, dependence ([place])   Current Medications:  Current Facility-Administered Medications  Medication Dose Route Frequency Provider Last Rate Last Dose  . acetaminophen (TYLENOL) tablet 1,000 mg  1,000 mg Oral Q6H PRN Hildred Priest, MD   1,000 mg at 03/05/17 1621  . alum & mag hydroxide-simeth (MAALOX/MYLANTA) 200-200-20 MG/5ML suspension 30 mL  30 mL Oral Q4H PRN Gonzella Lex, MD      . aspirin EC tablet 81 mg  81 mg Oral Daily Gonzella Lex, MD   81 mg at 03/08/17 0758  . atorvastatin (LIPITOR) tablet 80 mg  80 mg Oral q1800 Jolanta B Pucilowska, MD   80 mg at 03/07/17 1713  . gabapentin (NEURONTIN) capsule 800 mg  800 mg Oral TID Clovis Fredrickson, MD   800 mg at 03/08/17 0758  . ibuprofen (ADVIL,MOTRIN) tablet 600 mg  600 mg Oral Q6H PRN Hildred Priest, MD   600 mg at 03/06/17 2025  . magnesium hydroxide (MILK OF MAGNESIA) suspension 30 mL  30 mL Oral Daily PRN Gonzella Lex, MD      . nicotine (NICODERM CQ - dosed in mg/24 hours) patch 21 mg  21 mg Transdermal Daily Jolanta B Pucilowska, MD   21 mg at 03/08/17 0758  . pantoprazole (PROTONIX) EC tablet 40 mg  40 mg Oral Daily Clovis Fredrickson, MD   40 mg at 03/08/17 0758  . sertraline (ZOLOFT) tablet 50 mg  50 mg Oral Daily Gonzella Lex, MD   50 mg at 03/08/17 0758  . traZODone (DESYREL) tablet 150 mg  150 mg Oral QHS PRN Clovis Fredrickson, MD        PTA Medications: Prescriptions Prior to Admission  Medication Sig Dispense Refill Last Dose  . lisinopril (PRINIVIL,ZESTRIL) 20 MG tablet Take 1 tablet (20 mg total) by mouth daily. For high blood pressure 30 tablet 0 03/02/2017 at Unknown time  . [DISCONTINUED] aspirin 81 MG EC tablet Take 1 tablet (81 mg total) by mouth daily. For heart health 1 tablet 0 03/02/2017 at Unknown time  . [DISCONTINUED] atorvastatin (LIPITOR) 80 MG tablet Take 1 tablet (80 mg total) by mouth daily. For high cholesterol 30 tablet 0 03/02/2017 at Unknown time  . [DISCONTINUED] gabapentin (NEURONTIN) 400 MG capsule Take 2 capsules (800 mg total) by mouth 3 (three) times daily. For agitation/substance withdrawal symptoms 180 capsule 0 03/02/2017 at Unknown time  . [DISCONTINUED] Multiple Vitamin (MULTIVITAMIN WITH MINERALS) TABS tablet Take 1 tablet by mouth daily. For low Vitamin   03/02/2017 at Unknown time  . [DISCONTINUED] sertraline (ZOLOFT) 50 MG tablet Take 1 tablet (50 mg total) by mouth daily. For depression 30 tablet 0 03/02/2017 at Unknown time  . hydrOXYzine (ATARAX/VISTARIL) 50 MG tablet Take 1 tablet (50 mg total) by mouth at bedtime as needed (sleep). Anxiety (Patient not taking: Reported on 03/02/2017) 30 tablet 0 Not Taking at Unknown time  . nicotine (NICODERM CQ - DOSED IN MG/24 HOURS) 21 mg/24hr patch Place 1 patch (21 mg total) onto  the skin daily. For smoking cessation (Patient not taking: Reported on 03/02/2017) 28 patch 0 Not Taking at Unknown time    Patient Stressors: Financial difficulties Substance abuse  Patient Strengths: Ability for insight Average or above average intelligence  Treatment Modalities: Medication Management, Group therapy, Case management,  1 to 1 session with clinician, Psychoeducation, Recreational therapy.   Physician Treatment Plan for Primary Diagnosis: Severe recurrent major depression without psychotic features ([place]) Long Term Goal(s): Improvement in symptoms so as ready  for discharge Improvement in symptoms so as ready for discharge   Short Term Goals: Ability to identify changes in lifestyle to reduce recurrence of condition will improve Ability to verbalize feelings will improve Ability to disclose and discuss suicidal ideas Ability to demonstrate self-control will improve Ability to identify and develop effective coping behaviors will improve Ability to maintain clinical measurements within normal limits will improve Compliance with prescribed medications will improve Ability to identify triggers associated with substance abuse/mental health issues will improve Ability to identify changes in lifestyle to reduce recurrence of condition will improve Ability to demonstrate self-control will improve Ability to identify triggers associated with substance abuse/mental health issues will improve  Medication Management: Evaluate patient's response, side effects, and tolerance of medication regimen.  Therapeutic Interventions: 1 to 1 sessions, Unit Group sessions and Medication administration.  Evaluation of Outcomes: Adequate for discharge.  Physician Treatment Plan for Secondary Diagnosis: Principal Problem:   Severe recurrent major depression without psychotic features ([place]) Active Problems:   Tobacco use disorder   Alcohol withdrawal ([facility])   Alcohol use disorder, severe, dependence ([facility])   Alcohol-induced mood disorder ([facility])   Cannabis use disorder, moderate, dependence ([facility])   Cocaine use disorder, moderate, dependence ([place])  Long Term Goal(s): Improvement in symptoms so as ready for discharge Improvement in symptoms so as ready for discharge   Short Term Goals: Ability to identify changes in lifestyle to reduce recurrence of condition will improve Ability to verbalize feelings will improve Ability to disclose and discuss suicidal ideas Ability to demonstrate self-control will improve Ability to identify and develop effective coping behaviors  will improve Ability to maintain clinical measurements within normal limits will improve Compliance with prescribed medications will improve Ability to identify triggers associated with substance abuse/mental health issues will improve Ability to identify changes in lifestyle to reduce recurrence of condition will improve Ability to demonstrate self-control will improve Ability to identify triggers associated with substance abuse/mental health issues will improve     Medication Management: Evaluate patient's response, side effects, and tolerance of medication regimen.  Therapeutic Interventions: 1 to 1 sessions, Unit Group sessions and Medication administration.  Evaluation of Outcomes: Adequate for discharge.   RN Treatment Plan for Primary Diagnosis: Severe recurrent major depression without psychotic features ([place]) Long Term Goal(s): Knowledge of disease and therapeutic regimen to maintain health will improve  Short Term Goals: Ability to demonstrate self-control and Ability to identify and develop effective coping behaviors will improve  Medication Management: RN will administer medications as ordered by provider, will assess and evaluate patient's response and provide education to patient for prescribed medication. RN will report any adverse and/or side effects to prescribing provider.  Therapeutic Interventions: 1 on 1 counseling sessions, Psychoeducation, Medication administration, Evaluate responses to treatment, Monitor vital signs and CBGs as ordered, Perform/monitor CIWA, COWS, AIMS and Fall Risk screenings as ordered, Perform wound care treatments as ordered.  Evaluation of Outcomes: Adequate for discharge.   LCSW Treatment Plan for Primary Diagnosis: Severe recurrent major depression  without psychotic features [facility]) Long Term Goal(s): Safe transition to appropriate next level of care at discharge, Engage patient in therapeutic group addressing interpersonal concerns.  Short  Term Goals: Engage patient in aftercare planning with referrals and resources  Therapeutic Interventions: Assess for all discharge needs, 1 to 1 time with Social worker, Explore available resources and support systems, Assess for adequacy in community support network, Educate family and significant other(s) on suicide prevention, Complete Psychosocial Assessment, Interpersonal group therapy.  Evaluation of Outcomes: Adequate for discharge.   Recreational Therapy Treatment Plan for Primary Diagnosis: Severe recurrent major depression without psychotic features ([place]) Long Term Goal(s): Patient will participate in recreation therapy treatment in at least 2 group sessions without prompting from LRT  Short Term Goals: Increase self-esteem, Increase healthy coping skills  Treatment Modalities: Group Therapy, Individual Treatment Sessions  Therapeutic Interventions: Psychoeducation  Evaluation of Outcomes: Adequate for discharge.   Progress in Treatment: Attending groups: No. Participating in groups: No. Taking medication as prescribed: Yes. Toleration medication: Yes. Family/Significant other contact made: Pt refused family contact. Patient understands diagnosis: Yes. Discussing patient identified problems/goals with staff: Yes. Medical problems stabilized or resolved: Yes. Denies suicidal/homicidal ideation: Yes. Issues/concerns per patient self-inventory: No.  New problem(s) identified: Yes, Describe:  alcoholism  New Short Term/Long Term Goal(s): Pt's goal is to feel better mentally and physically.  Discharge Plan or Barriers: Pt will return to [facility] and follow-up with [place].  Reason for Continuation of Hospitalization: Depression Suicidal ideation Withdrawal symptoms  Estimated Length of Stay: D/C 03/08/2017  Attendees: Patient: Darrell [name] 03/08/2017 10:39 AM  Physician: Dr. Orson Slick, MD 03/08/2017 10:39 AM  Nursing: Polly Cobia, RN   03/08/2017 10:39 AM  RN Care Manager: 03/08/2017 10:39 AM  Social Worker: Glorious Peach, MSW, LCSW-A 03/08/2017 10:39 AM  Recreational Therapist: Drue Flirt, LRT/CTRS  03/08/2017 10:39 AM  Other:  03/08/2017 10:39 AM  Other:  03/08/2017 10:39 AM  Other: 03/08/2017 10:39 AM    Scribe for Treatment Team: Emilie Rutter, [place] 03/08/2017 10:39 AM

## 2017-03-08 NOTE — [facility] Group Notes (Signed)
  [place] LCSW Group Therapy Note  Date/Time: 03/08/17, 0930  Type of Therapy/Topic:  Group Therapy:  Emotion Regulation  Participation Level:  Did Not Attend   Mood:  Description of Group:    The purpose of this group is to assist patients in learning to regulate negative emotions and experience positive emotions. Patients will be guided to discuss ways in which they have been vulnerable to their negative emotions. These vulnerabilities will be juxtaposed with experiences of positive emotions or situations, and patients challenged to use positive emotions to combat negative ones. Special emphasis will be placed on coping with negative emotions in conflict situations, and patients will process healthy conflict resolution skills.  Therapeutic Goals: 1. Patient will identify two positive emotions or experiences to reflect on in order to balance out negative emotions:  2. Patient will label two or more emotions that they find the most difficult to experience:  3. Patient will be able to demonstrate positive conflict resolution skills through discussion or role plays:   Summary of Patient Progress:       Therapeutic Modalities:   Cognitive Behavioral Therapy Feelings Identification Dialectical Behavioral Therapy  Lurline Idol, LCSW

## 2017-03-08 NOTE — Plan of Care (Signed)
Problem: Safety: Goal: Ability to remain free from injury will improve Outcome: Not Met (add Reason) Calm and cooperative. C/o difficulty sleeping. Vistaril 25 mg po once. Med adm w/ education. Safety maintained with q 15 min checks. Pt remains free from harm.

## 2017-03-08 NOTE — [facility] Group Notes (Signed)
[place] Group Notes:  (Nursing/MHT/Case Management/Adjunct)  Date:  03/08/2017  Time:  12:34 AM  Type of Therapy:  Psychoeducational Skills  Participation Level:  Active  Participation Quality:  Appropriate  Affect:  Appropriate  Cognitive:  Alert  Insight:  Good  Engagement in Group:  Engaged  Modes of Intervention:  Activity  Summary of Progress/Problems:  Darrell [name] 03/08/2017, 12:34 AM

## 2017-03-08 NOTE — Progress Notes (Signed)
Patient discharged home. DC instructions provided and explained. Medications reviewed. Rx given. All questions answered. Pt stable at discharge. Denies SI, HI, AVH. AVS, transition and risk summary given with patient.
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
## 2017-03-08 NOTE — Progress Notes (Signed)
  [facility] Adult Case Management Discharge Plan :  Will you be returning to the same living situation after discharge:  Yes,  [place]. At discharge, do you have transportation home?: Yes,  [employer] [facility]. Do you have the ability to pay for your medications: Yes,  insurance coverage.  Release of information consent forms completed and in the chart;  Patient's signature needed at discharge.  Patient to Follow up at: Follow-up [facility]. Go on 03/10/2017.   Why:  Please follow-up with [place] on Friday, March 12th at [facility] for your therapy and medication management appointment. Bring medications and discharge paperwork to this appointment. Contact information: Address: [address] [place], [place] [address] Phone: [phone] Fax: [phone]          Next level of care provider has access to [place] and Suicide Prevention discussed: Yes,  SPE completed with patient.  Have you used any form of tobacco in the last 30 days? (Cigarettes, Smokeless Tobacco, Cigars, and/or Pipes): Yes  Has patient been referred to the Quitline?: Patient refused referral  Patient has been referred for addiction treatment: Pt. refused referral  Emilie Rutter, MSW, LCSW-A 03/08/2017, 9:23 AM

## 2017-03-08 NOTE — Plan of Care (Signed)
Problem: [facility] Participation in Recreation Therapeutic Interventions Goal: STG-Patient will demonstrate improved self esteem by identif STG: Self-Esteem - Within 4 treatment sessions, patient will verbalize at least 5 positive affirmation statements in each of 2 treatment sessions to increase self-esteem.  Outcome: Completed/Met Date Met: 03/08/17 Treatment Session 3; Completed 2 out of 2: At approximately 11:55 am, LRT met with patient in craft room. Patient verbalized 5 positive affirmation statements. Patient reported it felt "good". LRT encouraged patient to continue saying positive affirmation statements.  Leonette Monarch, LRT/CTRS 03.14.18 12:03 pm Goal: STG-Patient will identify at least five coping skills for ** STG: Coping Skills - Within 4 treatment sessions, patient will verbalize at least 5 coping skills for substance abuse in each of 2 treatment sessions to decrease substance abuse.  Outcome: Completed/Met Date Met: 03/08/17 Treatment Session 3; Completed 2 out of 2: At approximately 11:55 am, LRT met with patient in craft room. Patient verbalized 5 coping skills for substance abuse. LRT encouraged patient to use his coping skills instead of turning to substances.  Leonette Monarch, LRT/CTRS 03.14.18 12:04 pm

## 2017-08-02 ENCOUNTER — Encounter (HOSPITAL_COMMUNITY): Payer: Self-pay

## 2017-08-02 ENCOUNTER — Emergency Department (HOSPITAL_COMMUNITY)
Admission: EM | Admit: 2017-08-02 | Discharge: 2017-08-02 | Disposition: A | Discharge status: Other Acute Inpatient Hospital | Payer: No Typology Code available for payment source | Attending: Emergency Medicine | Admitting: Emergency Medicine

## 2017-08-02 ENCOUNTER — Emergency Department (HOSPITAL_COMMUNITY): Payer: No Typology Code available for payment source

## 2017-08-02 DIAGNOSIS — Z955 Presence of coronary angioplasty implant and graft: Secondary | ICD-10-CM | POA: Insufficient documentation

## 2017-08-02 DIAGNOSIS — Z7982 Long term (current) use of aspirin: Secondary | ICD-10-CM | POA: Diagnosis not present

## 2017-08-02 DIAGNOSIS — I129 Hypertensive chronic kidney disease with stage 1 through stage 4 chronic kidney disease, or unspecified chronic kidney disease: Secondary | ICD-10-CM | POA: Diagnosis not present

## 2017-08-02 DIAGNOSIS — Z79899 Other long term (current) drug therapy: Secondary | ICD-10-CM | POA: Diagnosis not present

## 2017-08-02 DIAGNOSIS — R4587 Impulsiveness: Secondary | ICD-10-CM

## 2017-08-02 DIAGNOSIS — I2510 Atherosclerotic heart disease of native coronary artery without angina pectoris: Secondary | ICD-10-CM | POA: Diagnosis not present

## 2017-08-02 DIAGNOSIS — F339 Major depressive disorder, recurrent, unspecified: Secondary | ICD-10-CM

## 2017-08-02 DIAGNOSIS — F17210 Nicotine dependence, cigarettes, uncomplicated: Secondary | ICD-10-CM | POA: Diagnosis not present

## 2017-08-02 DIAGNOSIS — N182 Chronic kidney disease, stage 2 (mild): Secondary | ICD-10-CM | POA: Insufficient documentation

## 2017-08-02 DIAGNOSIS — F1010 Alcohol abuse, uncomplicated: Secondary | ICD-10-CM | POA: Diagnosis not present

## 2017-08-02 DIAGNOSIS — R45851 Suicidal ideations: Secondary | ICD-10-CM

## 2017-08-02 DIAGNOSIS — F332 Major depressive disorder, recurrent severe without psychotic features: Secondary | ICD-10-CM | POA: Diagnosis not present

## 2017-08-02 LAB — POCT I-STAT TROPONIN I: Troponin i, poc: 0 ng/mL (ref 0.00–0.08)

## 2017-08-02 LAB — RAPID URINE DRUG SCREEN, HOSP PERFORMED
Amphetamines: NOT DETECTED
Barbiturates: NOT DETECTED
Benzodiazepines: NOT DETECTED
Cocaine: NOT DETECTED
Opiates: NOT DETECTED
Tetrahydrocannabinol: POSITIVE — AB

## 2017-08-02 LAB — HEPATIC FUNCTION PANEL
ALT: 83 U/L — ABNORMAL HIGH (ref 17–63)
AST: 75 U/L — ABNORMAL HIGH (ref 15–41)
Albumin: 3.6 g/dL (ref 3.5–5.0)
Alkaline Phosphatase: 67 U/L (ref 38–126)
Bilirubin, Direct: 0.4 mg/dL (ref 0.1–0.5)
Indirect Bilirubin: 0.6 mg/dL (ref 0.3–0.9)
Total Bilirubin: 1 mg/dL (ref 0.3–1.2)
Total Protein: 7.4 g/dL (ref 6.5–8.1)

## 2017-08-02 LAB — CBC
HEMATOCRIT: 41.4 % (ref 39.0–52.0)
Hemoglobin: 15 g/dL (ref 13.0–17.0)
MCH: 32 pg (ref 26.0–34.0)
MCHC: 36.2 g/dL — AB (ref 30.0–36.0)
MCV: 88.3 fL (ref 78.0–100.0)
PLATELETS: 199 10*3/uL (ref 150–400)
RBC: 4.69 MIL/uL (ref 4.22–5.81)
RDW: 13.3 % (ref 11.5–15.5)
WBC: 6.8 10*3/uL (ref 4.0–10.5)

## 2017-08-02 LAB — BASIC METABOLIC PANEL
Anion gap: 11 (ref 5–15)
BUN: 6 mg/dL (ref 6–20)
CHLORIDE: 99 mmol/L — AB (ref 101–111)
CO2: 21 mmol/L — AB (ref 22–32)
CREATININE: 1.04 mg/dL (ref 0.61–1.24)
Calcium: 8.2 mg/dL — ABNORMAL LOW (ref 8.9–10.3)
GFR calc Af Amer: >60 mL/min (ref >60)
GFR calc non Af Amer: >60 mL/min (ref >60)
GLUCOSE: 94 mg/dL (ref 65–99)
POTASSIUM: 3.4 mmol/L — AB (ref 3.5–5.1)
SODIUM: 131 mmol/L — AB (ref 135–145)

## 2017-08-02 LAB — ETHANOL: Alcohol, Ethyl (B): 76 mg/dL — ABNORMAL HIGH

## 2017-08-02 MED ORDER — LORAZEPAM 2 MG/ML IJ SOLN
1.0000 mg | Freq: Once | INTRAMUSCULAR | Status: AC
  Administered 2017-08-02: 1 mg via INTRAVENOUS
  Filled 2017-08-02: qty 1

## 2017-08-02 MED ORDER — SODIUM CHLORIDE 0.9 % IV BOLUS (SEPSIS)
1000.0000 mL | Freq: Once | INTRAVENOUS | Status: AC
  Administered 2017-08-02: 1000 mL via INTRAVENOUS

## 2017-08-02 MED ORDER — CYCLOBENZAPRINE HCL 10 MG PO TABS
5.0000 mg | ORAL_TABLET | Freq: Once | ORAL | Status: AC
  Administered 2017-08-02: 5 mg via ORAL
  Filled 2017-08-02: qty 1

## 2017-08-02 MED ORDER — KETOROLAC TROMETHAMINE 30 MG/ML IJ SOLN
30.0000 mg | Freq: Once | INTRAMUSCULAR | Status: AC
  Administered 2017-08-02: 30 mg via INTRAVENOUS
  Filled 2017-08-02: qty 1

## 2017-08-02 NOTE — Progress Notes (Signed)
CSW attempted to speak with patient regarding homeless resources and substance abuse treatment. Patient stated he did not want to talk but sleep. CSW left homeless and substance abuse resources next to patients bed.   Kingsley Spittle, [facility] Emergency Room Clinical Social Worker [phone]

## 2017-08-02 NOTE — BH Assessment (Addendum)
[facility] Assessment Progress Note  Per Mojeed Akintayo, Md, this pt does not require psychiatric hospitalization at this time.  Pt is to be discharged from [facility] with recommendation to follow up with [facility] or the [facility].  This has been included in pt's discharge instructions.  Pt's nurse has been notified.  Jalene Mullet, MA Triage Specialist [phone]  Addendum:  Pt's disposition has been updated.  See note by this writer on this date at 12:31.  Jalene Mullet, [place] Triage Specialist [phone]

## 2017-08-02 NOTE — ED Notes (Addendum)
Patient refused to be hooked up to cardiac monitor

## 2017-08-02 NOTE — BH Assessment (Signed)
[place] Assessment Progress Note  Per Corena Pilgrim, MD, this pt requires psychiatric hospitalization at this time.  At 12:15 Olivia Mackie calls from [facility].  Pt has been accepted to their facility by  Dr Dola Factor .  Ethelene Hal, NP, concurs with this disposition, as does the pt who is currently under voluntary status.  Pt's nurse has been notified, and agrees to call report to [phone].  Pt is to be transported via Stacey Drain, [place] Triage Specialist [phone]
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
## 2017-08-02 NOTE — ED Triage Notes (Signed)
Pt BIB [facility] from [facility]. He was triaged earlier tonight, but left to go to [employer]. Pt eating a box of chicken in triage. He states that he is experiencing chest pain that he attributes to his alcohol use. He is requesting help to detox from alcohol. Pt appears intoxicated in triage. A&Ox4.

## 2017-08-02 NOTE — BH Assessment (Signed)
Tele Assessment Note   Darrell [name] is an 56 y.o. male. Pt reports SI with a plan to jump in front of a train. Pt denies HI and AVH. Pt reports alcohol abuse. Per Pt he drinks a 5th of liquor a day. Pt reports occasional marijuana use. Pt states he is currently homeless and has no family supports. Pt denies current outpatient treatment. Pt reports previous inpatient treatment since 2015. Pt states he has been hospitalized at [facility] and [facility]. Pt was agitated during the assessment. Pt states his agitation is due to pain and alcohol withdrawals. Pt states he has pain in his ankles and back and he needs pain medication.   Per Dr. Darleene Cleaver and Margarita Grizzle, NP Pt does not meet inpatient criteria. Recommends D/C with resources.  Diagnosis:  F10.20 Alcohol use, severe; F33.2 MDD  Past Medical History:  Past Medical History:  Diagnosis Date  . Alcohol abuse   . Anxiety   . Chronic pain   . Hepatitis C   . History of heroin abuse   . Myocardial infarction ([place])   . Tuberculosis     Past Surgical History:  Procedure Laterality Date  . CORONARY STENT PLACEMENT      Family History:  Family History  Problem Relation Age of Onset  . Heart attack Unknown        paternal uncle died MI 62   . Cancer Unknown        lung-maternal GM  . Cystic fibrosis Unknown        mom. died with pna  . Alcohol abuse Unknown        brother    Social History:  reports that he has been smoking Cigarettes.  He has been smoking about 0.50 packs per day. He has never used smokeless tobacco. He reports that he drinks alcohol. He reports that he uses drugs, including Marijuana and Cocaine.  Additional Social History:  Alcohol / Drug Use Pain Medications: please see mar Prescriptions: please see mar Over the Counter: please see mar History of alcohol / drug use?: Yes Longest period of sobriety (when/how long): unknown Substance #1 Name of Substance 1: alcohol 1 - Age of First Use: unknown 1 - Amount (size/oz):  cannot recall 1 - Frequency: daily 1 - Duration: ongoing 1 - Last Use / Amount: 08/02/17  CIWA: CIWA-Ar BP: (!) 143/105 Pulse Rate: 97 COWS:    PATIENT STRENGTHS: (choose at least two) Average or above average intelligence Communication skills  Allergies: No Known Allergies  Home Medications:  (Not in a hospital admission)  OB/GYN Status:  No LMP for male patient.  General Assessment Data Location of Assessment: [facility] ED TTS Assessment: In system Is this a Tele or Face-to-Face Assessment?: Tele Assessment Is this an Initial Assessment or a Re-assessment for this encounter?: Initial Assessment Marital status: Single Maiden name: NA Is patient pregnant?: No Pregnancy Status: No Living Arrangements: Other (Comment) (homeless) Can pt return to current living arrangement?: Yes Admission Status: Voluntary Is patient capable of signing voluntary admission?: Yes Referral Source: Self/Family/Friend Insurance type: [place] Living Arrangements: Other (Comment) (homeless) Legal Guardian: Other: (self) Name of Psychiatrist: NA Name of Therapist: NA  Education Status Is patient currently in school?: No Current Grade: NA Highest grade of school patient has completed: GED Name of school: NA Contact person: NA  Risk to self with the past 6 months Suicidal Ideation: Yes-Currently Present Has patient been a risk to self within the past  6 months prior to admission? : Yes Suicidal Intent: Yes-Currently Present Has patient had any suicidal intent within the past 6 months prior to admission? : Yes Is patient at risk for suicide?: Yes Suicidal Plan?: Yes-Currently Present Has patient had any suicidal plan within the past 6 months prior to admission? : Yes Specify Current Suicidal Plan: to jump in front of a train Access to Means: Yes Specify Access to Suicidal Means: access to a train What has been your use of drugs/alcohol within the last 12 months?: alcohol and  marijuana Previous Attempts/Gestures: Yes How many times?: 3 Other Self Harm Risks: NA Triggers for Past Attempts: Other (Comment) (SA) Intentional Self Injurious Behavior: None Family Suicide History: No Recent stressful life event(s): Other (Comment) (Sa) Persecutory voices/beliefs?: No Depression: Yes Depression Symptoms: Tearfulness, Isolating, Loss of interest in usual pleasures, Feeling worthless/self pity, Feeling angry/irritable Substance abuse history and/or treatment for substance abuse?: No Suicide prevention information given to non-admitted patients: Not applicable  Risk to Others within the past 6 months Homicidal Ideation: No Does patient have any lifetime risk of violence toward others beyond the six months prior to admission? : No Thoughts of Harm to Others: No Current Homicidal Intent: No Current Homicidal Plan: No Access to Homicidal Means: No Identified Victim: NA History of harm to others?: No Assessment of Violence: None Noted Violent Behavior Description: NA Does patient have access to weapons?: No Criminal Charges Pending?: No Does patient have a court date: No Is patient on probation?: No  Psychosis Hallucinations: None noted Delusions: None noted  Mental Status Report Appearance/Hygiene: Unremarkable Eye Contact: Fair Motor Activity: Freedom of movement Speech: Logical/coherent Level of Consciousness: Alert Mood: Angry Affect: Angry Anxiety Level: Minimal Thought Processes: Coherent, Relevant Judgement: Impaired Orientation: Person, Place, Time, Situation Obsessive Compulsive Thoughts/Behaviors: None  Cognitive Functioning Concentration: Normal Memory: Recent Intact, Remote Intact IQ: Average Insight: Poor Impulse Control: Poor Appetite: Fair Weight Loss: 0 Weight Gain: 0 Sleep: Decreased Total Hours of Sleep: 5 Vegetative Symptoms: None  ADLScreening [facility] Assessment Services) Patient's cognitive ability adequate to safely  complete daily activities?: Yes Patient able to express need for assistance with ADLs?: Yes Independently performs ADLs?: Yes (appropriate for developmental age)  Prior Inpatient Therapy Prior Inpatient Therapy: Yes Prior Therapy Dates: 2015-present Prior Therapy Facilty/Provider(s): [facility] Reason for Treatment: detox   Prior Outpatient Therapy Prior Outpatient Therapy: No Prior Therapy Dates: NA Prior Therapy Facilty/Provider(s): NA Reason for Treatment: SA Does patient have an ACCT team?: No Does patient have Intensive In-House Services?  : No Does patient have [facility] services? : No Does patient have P4CC services?: No  ADL Screening (condition at time of admission) Patient's cognitive ability adequate to safely complete daily activities?: Yes Is the patient deaf or have difficulty hearing?: No Does the patient have difficulty seeing, even when wearing glasses/contacts?: No Does the patient have difficulty concentrating, remembering, or making decisions?: No Patient able to express need for assistance with ADLs?: Yes Does the patient have difficulty dressing or bathing?: No Independently performs ADLs?: Yes (appropriate for developmental age) Does the patient have difficulty walking or climbing stairs?: No Weakness of Legs: None Weakness of Arms/Hands: None       Abuse/Neglect Assessment (Assessment to be complete while patient is alone) Physical Abuse: Denies Verbal Abuse: Denies Sexual Abuse: Denies Exploitation of patient/patient's resources: Denies Self-Neglect: Denies     Regulatory affairs officer (For Healthcare) Does Patient Have a Medical Advance Directive?: No Would patient like information on creating a medical advance directive?:  No - Patient declined    Additional Information 1:1 In Past 12 Months?: No CIRT Risk: No Elopement Risk: No Does patient have medical clearance?: Yes     Disposition:  Disposition Initial Assessment Completed for this  Encounter: Yes  Kelcey Korus D 08/02/2017 8:25 AM
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
## 2020-03-26 ENCOUNTER — Other Ambulatory Visit: Payer: Self-pay

## 2020-03-26 ENCOUNTER — Emergency Department (HOSPITAL_COMMUNITY): Payer: No Typology Code available for payment source

## 2020-03-26 ENCOUNTER — Emergency Department (HOSPITAL_COMMUNITY)
Admission: EM | Admit: 2020-03-26 | Discharge: 2020-03-27 | Disposition: A | Discharge status: Other Acute Inpatient Hospital | Payer: No Typology Code available for payment source | Attending: Emergency Medicine | Admitting: Emergency Medicine

## 2020-03-26 DIAGNOSIS — Z20822 Contact with and (suspected) exposure to covid-19: Secondary | ICD-10-CM | POA: Insufficient documentation

## 2020-03-26 DIAGNOSIS — R45851 Suicidal ideations: Secondary | ICD-10-CM

## 2020-03-26 DIAGNOSIS — I129 Hypertensive chronic kidney disease with stage 1 through stage 4 chronic kidney disease, or unspecified chronic kidney disease: Secondary | ICD-10-CM | POA: Diagnosis not present

## 2020-03-26 DIAGNOSIS — I2510 Atherosclerotic heart disease of native coronary artery without angina pectoris: Secondary | ICD-10-CM | POA: Diagnosis not present

## 2020-03-26 DIAGNOSIS — Y906 Blood alcohol level of 120-199 mg/100 ml: Secondary | ICD-10-CM | POA: Insufficient documentation

## 2020-03-26 DIAGNOSIS — R0789 Other chest pain: Secondary | ICD-10-CM | POA: Diagnosis present

## 2020-03-26 DIAGNOSIS — I252 Old myocardial infarction: Secondary | ICD-10-CM | POA: Diagnosis not present

## 2020-03-26 DIAGNOSIS — F1020 Alcohol dependence, uncomplicated: Secondary | ICD-10-CM | POA: Diagnosis present

## 2020-03-26 DIAGNOSIS — N182 Chronic kidney disease, stage 2 (mild): Secondary | ICD-10-CM | POA: Diagnosis not present

## 2020-03-26 DIAGNOSIS — F17210 Nicotine dependence, cigarettes, uncomplicated: Secondary | ICD-10-CM | POA: Insufficient documentation

## 2020-03-26 DIAGNOSIS — F332 Major depressive disorder, recurrent severe without psychotic features: Secondary | ICD-10-CM | POA: Diagnosis present

## 2020-03-26 DIAGNOSIS — Z79899 Other long term (current) drug therapy: Secondary | ICD-10-CM | POA: Insufficient documentation

## 2020-03-26 DIAGNOSIS — F10920 Alcohol use, unspecified with intoxication, uncomplicated: Secondary | ICD-10-CM | POA: Diagnosis not present

## 2020-03-26 LAB — CBC
HCT: 52 % (ref 39.0–52.0)
Hemoglobin: 17.3 g/dL — ABNORMAL HIGH (ref 13.0–17.0)
MCH: 31.1 pg (ref 26.0–34.0)
MCHC: 33.3 g/dL (ref 30.0–36.0)
MCV: 93.4 fL (ref 80.0–100.0)
Platelets: 333 10*3/uL (ref 150–400)
RBC: 5.57 MIL/uL (ref 4.22–5.81)
RDW: 14.1 % (ref 11.5–15.5)
WBC: 9.4 10*3/uL (ref 4.0–10.5)
nRBC: 0 % (ref 0.0–0.2)

## 2020-03-26 LAB — RAPID URINE DRUG SCREEN, HOSP PERFORMED
Amphetamines: NOT DETECTED
Barbiturates: NOT DETECTED
Benzodiazepines: NOT DETECTED
Cocaine: POSITIVE — AB
Opiates: NOT DETECTED
Tetrahydrocannabinol: POSITIVE — AB

## 2020-03-26 LAB — ETHANOL: Alcohol, Ethyl (B): 139 mg/dL — ABNORMAL HIGH (ref <10)

## 2020-03-26 MED ORDER — SODIUM CHLORIDE 0.9% FLUSH
3.0000 mL | Freq: Once | INTRAVENOUS | Status: AC
  Administered 2020-03-27: 04:00:00 3 mL via INTRAVENOUS

## 2020-03-26 NOTE — ED Triage Notes (Signed)
BIB EMS from [facility]. Presents with multiple complaints. Reports CP X 1 week, requesting detox from alcohol. Denies SI/HI.
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
## 2020-03-27 ENCOUNTER — Other Ambulatory Visit: Payer: Self-pay

## 2020-03-27 ENCOUNTER — Ambulatory Visit (HOSPITAL_COMMUNITY): Payer: Self-pay

## 2020-03-27 DIAGNOSIS — F1020 Alcohol dependence, uncomplicated: Secondary | ICD-10-CM

## 2020-03-27 DIAGNOSIS — F332 Major depressive disorder, recurrent severe without psychotic features: Secondary | ICD-10-CM

## 2020-03-27 LAB — BASIC METABOLIC PANEL
Anion gap: 13 (ref 5–15)
BUN: 9 mg/dL (ref 6–20)
CO2: 25 mmol/L (ref 22–32)
Calcium: 9 mg/dL (ref 8.9–10.3)
Chloride: 97 mmol/L — ABNORMAL LOW (ref 98–111)
Creatinine, Ser: 1.04 mg/dL (ref 0.61–1.24)
GFR calc Af Amer: >60 mL/min (ref >60)
GFR calc non Af Amer: >60 mL/min (ref >60)
Glucose, Bld: 84 mg/dL (ref 70–99)
Potassium: 3.9 mmol/L (ref 3.5–5.1)
Sodium: 135 mmol/L (ref 135–145)

## 2020-03-27 LAB — RESPIRATORY PANEL BY RT PCR (FLU A&B, COVID)
Influenza A by PCR: NEGATIVE
Influenza B by PCR: NEGATIVE
SARS Coronavirus 2 by RT PCR: NEGATIVE

## 2020-03-27 LAB — TROPONIN I (HIGH SENSITIVITY)
Troponin I (High Sensitivity): 19 ng/L — ABNORMAL HIGH (ref <18)
Troponin I (High Sensitivity): 18 ng/L — ABNORMAL HIGH (ref <18)

## 2020-03-27 MED ORDER — LORAZEPAM 1 MG PO TABS
2.0000 mg | ORAL_TABLET | Freq: Once | ORAL | Status: AC
  Administered 2020-03-27: 2 mg via ORAL
  Filled 2020-03-27: qty 2

## 2020-03-27 NOTE — ED Notes (Signed)
This RN attempted to call report to the receiving RN but RN is currently with another pt at this time; will reattempt in 10-20 minutes.

## 2020-03-27 NOTE — ED Notes (Signed)
RN at facility unable to take report at this time; name and number were left with RN and this RN will be notified when report can be given.

## 2020-03-27 NOTE — ED Notes (Signed)
Pt expressing SI thoughts. Says that if he doesn't get the help he needs this time, he will take action on his thoughts.

## 2020-03-27 NOTE — ED Notes (Signed)
ORDERED BREAKFAST--Quinterious Walraven  

## 2020-03-27 NOTE — ED Notes (Signed)
This RN attempted to call and give report to facility again, No answer at all this time; this RN will re-reattempt in 10-20 minutes.

## 2020-03-27 NOTE — ED Provider Notes (Signed)
  Physical Exam  BP 118/76   Pulse 88   Temp 97.8 F (36.6 C) (Oral)   Resp 19   SpO2 93%   Physical Exam  ED Course/Procedures     Procedures  MDM  Accepted at [facility] by Dr. Dareen Piano.  Patient reevaluated before transfer.       Davonna Belling, MD 03/27/20 [phone]

## 2020-03-27 NOTE — ED Notes (Signed)
This RN called transport for this pt; no answer at this time. This RN left a voicemail message but will attempt to call again in 10-15 minutes if no response.
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
## 2020-03-27 NOTE — Progress Notes (Signed)
Pt meets inpatient criteria. Referral information has been sent to the following hospitals for review:  [facility] Details [facility] Details [facility] Details  [facility] Details [place] Details  [place] Details [facility] Details [facility]  Disposition will continue to follow.   Audree Camel, LCSW, [place] Disposition [facility]/TTS [phone]

## 2020-03-27 NOTE — ED Notes (Signed)
TTS in room now
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
## 2020-03-27 NOTE — ED Notes (Signed)
Belongings in locker 4  

## 2020-03-27 NOTE — ED Notes (Signed)
Report given to Nira Conn, RN at [facility]

## 2020-03-27 NOTE — ED Notes (Signed)
Pt has two knifes (1 is a smaller knife and 1 is a larger machete), both will be left with [facility] security. Pt is agreeable with decision and will return for these belongings after discharge from [facility]. All other personal belongings will be sent with transport service ([facility]).

## 2020-03-27 NOTE — ED Notes (Signed)
Lunch tray ordered 

## 2020-03-27 NOTE — Consult Note (Signed)
Telepsych Consultation   Reason for Consult:  Depression, alcohol abuse Referring Physician: EDP Location of Patient: [facility] Location of Provider: [place] Department  Patient Identification: Darrell [name] MRN:  [id number] Principal Diagnosis: <principal problem not specified> Diagnosis:  Active Problems:   Alcohol use disorder, severe, dependence ([place])   MDD (major depressive disorder), recurrent severe, without psychosis ([place])   Total Time spent with patient: 20 minutes  Subjective:   Darrell [name] is a 59 y.o. male patient admitted with reports of inability to "stop drinking on my own. It's making me depressed and have suicidal thoughts."   HPI:    Initial evaluation by Curlene Dolphin, LCAS 03/27/2020 06:24 am:   Darrell [name] is an 59 y.o. male.  -Clinician reviewed note by Montine Circle PA.  Patient with past medical history notable for prior MI, heroin abuse, alcohol abuse presents to the emergency department with a chief complaint of suicidal thoughts. He states that he has been drinking a lot. He would like help with his drinking and is requesting detox from alcohol. States that if he does not get help, he is going to follow through with his suicidal thoughts. He states that he would be successful like his father. He also complains of some chest pain which has been ongoing for about a week. He states that he has some chest congestion. He reports using cocaine approximately 3 days ago.  Patient says that he wants to get help for his drinking and his depression.  He has been having thoughts of killing himself by getting hit by a semi truck on the highway.  Patient says that he has not had any previous attempts "Because I would have carried through with it."  Patient denies any HI.  He says "I would hurt myself before I hurt anyone else."  He also denies any A/V hallucinations.  Patient has been drinking a fifth of liquor daily for the last several  weeks.  He drank prior to arrival.  His BAL is 139 at 22:53.  Patient also uses cocaine and marijuana in binges and has been binging on them in the last several days.  Patient is hoping to get into a [place] or a [employer].  Patient has a depressed, flat affect which is consistent with stated depression.  He has good eye contact.  Thought process is logical and coherent.  He is not responding to internal stimuli.  Pt judgement is impaired.  He is oriented x4  Patient was at "[facility]" in [place] for inpatient and outpatient SA care.  He was at [facility] in 02/2017.  Patient has had some outpatient care through [facility] this year so far.  -Clinician discussed patient care with Anette Riedel, NP who recommends inpatient care.  Clinician informed Montine Circle, PA of disposition.  Daytime AC to review patient for [facility] placement.   Per psychiatric assessment 03/27/2020:   Patient reports that he is starting to have withdrawal symptoms. He states "I need to get into an [employer]. I just can't maintain sobriety longer than a few months. It's making me want to walk out in front of a semitruck. I've never done that before but I feel like I could right now." Discussed case with Dr. Dwyane Dee. Have initiated peer support consult who has faxed client out to several treatment centers. He does report that his main issue is with substance abuse but has a remote history of depression that is untreated.   Past Psychiatric  History: Alcohol abuse   Risk to Self: Suicidal Ideation: Yes-Currently Present Suicidal Intent: Yes-Currently Present Is patient at risk for suicide?: Yes Suicidal Plan?: Yes-Currently Present Specify Current Suicidal Plan: Get hit by a semi truck on highway Access to Means: Yes Specify Access to Suicidal Means: Traffic What has been your use of drugs/alcohol within the last 12 months?: ETOH, THC, crack How many times?: 0 Other Self Harm Risks: SA issues Triggers  for Past Attempts: None known Intentional Self Injurious Behavior: None Risk to Others: Homicidal Ideation: No Thoughts of Harm to Others: No Current Homicidal Intent: No Current Homicidal Plan: No Access to Homicidal Means: No Identified Victim: No one History of harm to others?: No Assessment of Violence: None Noted Violent Behavior Description: None reported Does patient have access to weapons?: No Criminal Charges Pending?: No Does patient have a court date: No Prior Inpatient Therapy: Prior Inpatient Therapy: Yes Prior Therapy Dates: Last year; 02/2017 Prior Therapy Facilty/Provider(s): [facility]; [facility] Reason for Treatment: detox and SA tx Prior Outpatient Therapy: Prior Outpatient Therapy: Yes Prior Therapy Dates: Last year Prior Therapy Facilty/Provider(s): [facility] in [place] Reason for Treatment: SA tx Does patient have an ACCT team?: No Does patient have Intensive In-House Services?  : No Does patient have [facility] services? : No Does patient have P4CC services?: No  Past Medical History:  Past Medical History:  Diagnosis Date  . Alcohol abuse   . Anxiety   . Chronic pain   . Hepatitis C   . History of heroin abuse   . Myocardial infarction ([place])   . Tuberculosis     Past Surgical History:  Procedure Laterality Date  . CORONARY STENT PLACEMENT     Family History:  Family History  Problem Relation Age of Onset  . Heart attack Unknown        paternal uncle died MI 74   . Cancer Unknown        lung-maternal GM  . Cystic fibrosis Unknown        mom. died with pna  . Alcohol abuse Unknown        brother   Family Psychiatric  History: unknown Social History:  Social History   Substance and Sexual Activity  Alcohol Use Yes   Comment: Daily.     Social History   Substance and Sexual Activity  Drug Use Yes  . Types: Marijuana, Cocaine   Comment: "Every once in a while"     Social History   Socioeconomic History  . Marital status: Single     Spouse name: Not on file  . Number of children: Not on file  . Years of education: Not on file  . Highest education level: Not on file  Occupational History  . Occupation: Unemployed  Tobacco Use  . Smoking status: Current Every Day Smoker    Packs/day: 0.50    Types: Cigarettes  . Smokeless tobacco: Never Used  . Tobacco comment: trying to quit  Substance and Sexual Activity  . Alcohol use: Yes    Comment: Daily.  . Drug use: Yes    Types: Marijuana, Cocaine    Comment: "Every once in a while"   . Sexual activity: Not on file  Other Topics Concern  . Not on file  Social History Narrative   Homeless    Smoking 1.5 ppd    Social Determinants of Health   Financial Resource Strain:   . Difficulty of Paying Living Expenses:   Food Insecurity:   . Worried  About Running Out of Food in the Last Year:   . [place] in the Last Year:   Transportation Needs:   . Lack of Transportation (Medical):   Marland Kitchen Lack of Transportation (Non-Medical):   Physical Activity:   . Days of Exercise per Week:   . Minutes of Exercise per Session:   Stress:   . Feeling of Stress :   Social Connections:   . Frequency of Communication with Friends and Family:   . Frequency of Social Gatherings with Friends and Family:   . Attends Religious Services:   . Active Member of Clubs or Organizations:   . Attends Archivist Meetings:   Marland Kitchen Marital Status:    Additional Social History:    Allergies:  No Known Allergies  Labs:  Results for orders placed or performed during the hospital encounter of 03/26/20 (from the past 48 hour(s))  Rapid urine drug screen (hospital performed)     Status: Abnormal   Collection Time: 03/26/20 10:51 PM  Result Value Ref Range   Opiates NONE DETECTED NONE DETECTED   Cocaine POSITIVE (A) NONE DETECTED   Benzodiazepines NONE DETECTED NONE DETECTED   Amphetamines NONE DETECTED NONE DETECTED   Tetrahydrocannabinol POSITIVE (A) NONE DETECTED   Barbiturates  NONE DETECTED NONE DETECTED    Comment: (NOTE) DRUG SCREEN FOR MEDICAL PURPOSES ONLY.  IF CONFIRMATION IS NEEDED FOR ANY PURPOSE, NOTIFY LAB WITHIN 5 DAYS. LOWEST DETECTABLE LIMITS FOR URINE DRUG SCREEN Drug Class                     Cutoff (ng/mL) Amphetamine and metabolites    1000 Barbiturate and metabolites    200 Benzodiazepine                 [id number] Tricyclics and metabolites     300 Opiates and metabolites        300 Cocaine and metabolites        300 THC                            50 Performed at [facility], [place] [address], [place], [place] [id number]   Basic metabolic panel     Status: Abnormal   Collection Time: 03/26/20 10:52 PM  Result Value Ref Range   Sodium 135 135 - 145 mmol/L   Potassium 3.9 3.5 - 5.1 mmol/L   Chloride 97 (L) 98 - 111 mmol/L   CO2 25 22 - 32 mmol/L   Glucose, Bld 84 70 - 99 mg/dL    Comment: Glucose reference range applies only to samples taken after fasting for at least 8 hours.   BUN 9 6 - 20 mg/dL   Creatinine, Ser 1.04 0.61 - 1.24 mg/dL   Calcium 9.0 8.9 - 10.3 mg/dL   GFR calc non Af Amer >60 >60 mL/min   GFR calc Af Amer >60 >60 mL/min   Anion gap 13 5 - 15    Comment: Performed at [facility] [address], [place] [address]  CBC     Status: Abnormal   Collection Time: 03/26/20 10:52 PM  Result Value Ref Range   WBC 9.4 4.0 - 10.5 K/uL   RBC 5.57 4.22 - 5.81 MIL/uL   Hemoglobin 17.3 (H) 13.0 - 17.0 g/dL   HCT 52.0 39.0 - 52.0 %   MCV 93.4 80.0 - 100.0 fL   MCH 31.1  26.0 - 34.0 pg   MCHC 33.3 30.0 - 36.0 g/dL   RDW 14.1 11.5 - 15.5 %   Platelets 333 150 - 400 K/uL   nRBC 0.0 0.0 - 0.2 %    Comment: Performed at [facility], [place] [address] [place], [place] [address]  Troponin I (High Sensitivity)     Status: Abnormal   Collection Time: 03/26/20 10:52 PM  Result Value Ref Range   Troponin I (High Sensitivity) 19 (H) <18 ng/L    Comment: (NOTE) Elevated high sensitivity troponin I (hsTnI) values  and significant  changes across serial measurements may suggest ACS but many other  chronic and acute conditions are known to elevate hsTnI results.  Refer to the "Links" section for chest pain algorithms and additional  guidance. Performed at [facility], [place] [address], [place], [place] [address]   Ethanol     Status: Abnormal   Collection Time: 03/26/20 10:53 PM  Result Value Ref Range   Alcohol, Ethyl (B) 139 (H) <10 mg/dL    Comment: (NOTE) Lowest detectable limit for serum alcohol is 10 mg/dL. For medical purposes only. Performed at [facility], [place] [address], [place], [place] [address]   Troponin I (High Sensitivity)     Status: Abnormal   Collection Time: 03/27/20  3:49 AM  Result Value Ref Range   Troponin I (High Sensitivity) 18 (H) <18 ng/L    Comment: (NOTE) Elevated high sensitivity troponin I (hsTnI) values and significant  changes across serial measurements may suggest ACS but many other  chronic and acute conditions are known to elevate hsTnI results.  Refer to the "Links" section for chest pain algorithms and additional  guidance. Performed at [facility], [place] [address], [place], [place] [address]   Respiratory Panel by RT PCR (Flu A&B, Covid) - Nasopharyngeal Swab     Status: None   Collection Time: 03/27/20  4:55 AM   Specimen: Nasopharyngeal Swab  Result Value Ref Range   SARS Coronavirus 2 by RT PCR NEGATIVE NEGATIVE    Comment: (NOTE) SARS-CoV-2 target nucleic acids are NOT DETECTED. The SARS-CoV-2 RNA is generally detectable in upper respiratoy specimens during the acute phase of infection. The lowest concentration of SARS-CoV-2 viral copies this assay can detect is 131 copies/mL. A negative result does not preclude SARS-Cov-2 infection and should not be used as the sole basis for treatment or other patient management decisions. A negative result may occur with  improper specimen collection/handling, submission of specimen  other than nasopharyngeal swab, presence of viral mutation(s) within the areas targeted by this assay, and inadequate number of viral copies (<131 copies/mL). A negative result must be combined with clinical observations, patient history, and epidemiological information. The expected result is Negative. Fact Sheet for Patients:  PinkCheek.be Fact Sheet for Healthcare Providers:  GravelBags.it This test is not yet ap proved or cleared by the [place] FDA and  has been authorized for detection and/or diagnosis of SARS-CoV-2 by FDA under an Emergency Use Authorization (EUA). This EUA will remain  in effect (meaning this test can be used) for the duration of the COVID-19 declaration under Section 564(b)(1) of the Act, 21 U.S.C. section 360bbb-3(b)(1), unless the authorization is terminated or revoked sooner.    Influenza A by PCR NEGATIVE NEGATIVE   Influenza B by PCR NEGATIVE NEGATIVE    Comment: (NOTE) The Xpert Xpress SARS-CoV-2/FLU/RSV assay is intended as an aid in  the diagnosis of influenza  from Nasopharyngeal swab specimens and  should not be used as a sole basis for treatment. Nasal washings and  aspirates are unacceptable for Xpert Xpress SARS-CoV-2/FLU/RSV  testing. Fact Sheet for Patients: PinkCheek.be Fact Sheet for Healthcare Providers: GravelBags.it This test is not yet approved or cleared by the [place] FDA and  has been authorized for detection and/or diagnosis of SARS-CoV-2 by  FDA under an Emergency Use Authorization (EUA). This EUA will remain  in effect (meaning this test can be used) for the duration of the  Covid-19 declaration under Section 564(b)(1) of the Act, 21  U.S.C. section 360bbb-3(b)(1), unless the authorization is  terminated or revoked. Performed at [facility], [place] [address], [place],  [address]      Medications:  No current facility-administered medications for this encounter.   Current Outpatient Medications  Medication Sig Dispense Refill  . aspirin 81 MG EC tablet Take 1 tablet (81 mg total) by mouth daily. For heart health 30 tablet 1  . atorvastatin (LIPITOR) 80 MG tablet Take 1 tablet (80 mg total) by mouth daily. For high cholesterol 30 tablet 1  . gabapentin (NEURONTIN) 600 MG tablet Take 600 mg by mouth 3 (three) times daily.    . vitamin B-12 (CYANOCOBALAMIN) 1000 MCG tablet Take 1,000 mcg by mouth daily.    Marland Kitchen gabapentin (NEURONTIN) 400 MG capsule Take 2 capsules (800 mg total) by mouth 3 (three) times daily. For agitation/substance withdrawal symptoms (Patient not taking: Reported on 03/27/2020) 180 capsule 0  . Multiple Vitamin (MULTIVITAMIN WITH MINERALS) TABS tablet Take 1 tablet by mouth daily. For low Vitamin (Patient not taking: Reported on 03/27/2020) 30 tablet 1    Musculoskeletal:  Unable to assess via camera  Psychiatric Specialty Exam: Physical Exam  Review of Systems  Blood pressure 118/76, pulse 88, temperature 97.8 F (36.6 C), temperature source Oral, resp. rate 19, SpO2 93 %.There is no height or weight on file to calculate BMI.  General Appearance: Disheveled  Eye Contact:  Fair  Speech:  Clear and Coherent  Volume:  Decreased  Mood:  Dysphoric and Hopeless  Affect:  Flat  Thought Process:  Coherent and Goal Directed  Orientation:  Full (Time, Place, and Person)  Thought Content:  Rumination  Suicidal Thoughts:  Yes.  with intent/plan  Homicidal Thoughts:  No  Memory:  Immediate;   Good Recent;   Good Remote;   Good  Judgement:  Poor  Insight:  Shallow  Psychomotor Activity:  Decreased and Tremor  Concentration:  Concentration: Fair and Attention Span: Fair  Recall:  Good  Fund of Knowledge:  Good  Language:  Good  Akathisia:  No  Handed:  Right  AIMS (if indicated):     Assets:  Sales promotion account executive Leisure Time Physical Health Resilience  ADL's:  Intact  Cognition:  WNL  Sleep:        Treatment Plan Summary: Plan Admit inpatient for substance abuse treatments. Initiate peer support consult.   Disposition: Recommend psychiatric Inpatient admission when medically cleared. Supportive therapy provided about ongoing stressors. Discussed crisis plan, support from social network, calling 911, coming to the Emergency Department, and calling Suicide Hotline.  This service was provided via telemedicine using a 2-way, interactive audio and video technology.  Names of all persons participating in this telemedicine service and their role in this encounter. Name: Alford Highland Role: Patient  Name: Elmarie Shiley  Role: Psych NP  Name:  Role:   Name:  Role:  Elmarie Shiley, NP 03/27/2020 1:06 PM

## 2020-03-27 NOTE — Patient Outreach (Signed)
Patient was accepted and agreeable to go to [facility] in [place], [place] for detox and dual diagnosis treatment. The accepting physician is Dr. Dareen Piano and the number to call for report is [phone]. Patient can arrive anytime after 4:00pm and will need transportation via safe transport.

## 2020-03-27 NOTE — Patient Outreach (Signed)
CPSS met with the patient at the [facility] in order to provide substance use recovery support and provide help with getting connected to substance use recovery resources. Patient reports a past history of alcohol, cocaine, and marijuana use. Patient's UDS was positive for cocaine and THC. Patient's EtOH test was positive. Patient is requesting help with getting connected to a residential substance use treatment detox program and plans to follow up with an [facility] or other sober living type house after detox admission. Patient reports that he has been to [facility] and [facility] within the past year for detox treatment. Patient reports that he was living in an [facility] in [place], [place] within the past year for two months. CPSS referred the patient to [facility], [facility], and [facility]. [facility] currently does not have male detox beds available and patient states that he is not allowed to go back to [facility] located in [place], [place] will keep the patient updated on the status of these referrals. CPSS also provided the patient with a residential detox treatment center list for community follow up. CPSS also provided several information for other substance use recovery resources including residential/outpatient substance use treatment center list, [facility] NA/AA online meeting list, [place] vacancy list/flier detailing [employer], and CPSS contact information. CPSS strongly encouraged the patient to follow up with CPSS if needed for further help with getting connected to substance use treatment resources or any other help with CPSS related substance use recovery support services.

## 2020-03-27 NOTE — BHH Counselor (Signed)
Per Anders Simmonds, CPSS patient accepted to [facility]. Accepting Physician is Dr. Dareen Piano. Patient may arrive anytime after 4 pm. Call report to [phone] Jenny Reichmann to notify patient's RN.

## 2023-02-07 ENCOUNTER — Other Ambulatory Visit: Payer: Self-pay | Admitting: Gastroenterology

## 2023-02-07 DIAGNOSIS — C189 Malignant neoplasm of colon, unspecified: Secondary | ICD-10-CM

## 2023-02-09 ENCOUNTER — Inpatient Hospital Stay: Admission: RE | Admit: 2023-02-09 | Payer: Medicaid Other | Source: Ambulatory Visit

## 2023-02-15 ENCOUNTER — Other Ambulatory Visit: Payer: Self-pay | Admitting: Gastroenterology

## 2023-02-15 DIAGNOSIS — C189 Malignant neoplasm of colon, unspecified: Secondary | ICD-10-CM

## 2023-12-28 NOTE — Progress Notes (Signed)
 Subjective   Patient ID:  Darrell [name] is a 63 y.o. (DOB 1960/12/30) male    Patient presents with  . Colon Cancer    Pt states he had colon cancer and he had surgery for it on June 11th at [facility].   . Establish Care  . Medication Refill     HPI:  Patient presents to establish care. Does have a history of nicotine  dependence, ETOH abuse, adenocarcinoma of the sigmoid colon, CAD, SI, HLD, HTN, GERD, CKD, methamphetamine use, cocaine abuse, cannabis use, rhabdomyolysis, cluster b personality traits, MDD and Hepatitis C. Patient states he stopped taking Viibryd for MDD because he was grouchy on it and it wasn't doing anything for him. States he has an appointment tomorrow with a psych facility. Reports he has a history of ADHD and was on ritalin in the past. States he has been having issues with that and would like to get back on the medication that worked. Reports having part of his colon removed in June of this year and needs to follow up with GI. He also needs a referral to cardiology for his history of a MI. Denies any complaints or concerns. Does also need medication refills. Patient states he has not drank or used any illegal substance in a while.   Reviewed and updated this visit by provider: Tobacco  Allergies  Meds  Problems  Med Hx  Surg Hx  Fam Hx        Review of Systems  Pertinent systems reviewed and negative except as noted in HPI.  Objective   Vitals:   12/28/23 1319  BP: 138/88  Patient Position: Sitting  Pulse: 92  Temp: 98.4 F (36.9 C)  TempSrc: Oral  Height: 5' 9 (1.753 m)  Weight: 210 lb 6.4 oz (95.4 kg)  SpO2: 95%  BMI (Calculated): 31.1     Physical Exam Vitals and nursing note reviewed.  Constitutional:      Appearance: Normal appearance.  HENT:     Head: Normocephalic and atraumatic.  Cardiovascular:     Rate and Rhythm: Normal rate and regular rhythm.     Heart sounds: No murmur heard.    No gallop.  Pulmonary:      Effort: Pulmonary effort is normal. No respiratory distress.     Breath sounds: Normal breath sounds. No wheezing, rhonchi or rales.  Neurological:     General: No focal deficit present.     Mental Status: He is alert.  Psychiatric:        Mood and Affect: Mood normal.        Behavior: Behavior normal.         Assessment and Plan  1. Encounter to establish care (Primary) 2. Adenocarcinoma of sigmoid colon (*) -     Ambulatory referral to Gastroenterology 3. Bipolar affective disorder, remission status unspecified (*) 4. Chronic hepatitis C without hepatic coma (*) -     Ambulatory referral to Gastroenterology -     Comprehensive Metabolic Panel; Future 5. Medication refill 6. Tobacco use disorder 7. Essential (primary) hypertension -     lisinopril  (PRINIVIL ,ZESTRIL ) 20 mg tablet; Take one tablet (20 mg dose) by mouth every morning., Starting Thu 12/28/2023, Until Tue 06/25/2024, Normal -     Comprehensive Metabolic Panel; Future 8. Coronary artery disease, unspecified vessel or lesion type, unspecified whether angina present, unspecified whether native or transplanted heart -     Ambulatory referral to Cardiology -     Comprehensive Metabolic Panel;  Future 9. Vitamin D deficiency -     Vitamin D 25 Hydroxy; Future 10. Hyperlipidemia, unspecified hyperlipidemia type -     atorvastatin  (LIPITOR ) 40 mg tablet; Take one tablet (40 mg dose) by mouth every morning., Starting Thu 12/28/2023, Until Tue 06/25/2024, Normal -     Lipid Panel With LDL/HDL Ratio; Future 11. History of MI (myocardial infarction) -     Ambulatory referral to Cardiology -     Comprehensive Metabolic Panel; Future 12. Cervical radiculopathy -     gabapentin  (NEURONTIN ) 400 mg capsule; Take two capsules (800 mg dose) by mouth 3 (three) times a day., Starting Thu 12/28/2023, Until Tue 06/25/2024, Normal    Plan: Care established. Referral placed to GI. Has an appointment with psych tomorrow per patient. GI referral.  Patient had labs in November with Hep C being reactive. Refills sent to pharmacy. Still smoking, but states he has cut back. Will be in touch with lab results. Referral to cardiology placed. Will check levels. Not currently on Vitamin D. Will be in touch with lab results. See #8. Refill sent to pharmacy.  Follow up in about 6 months (around 06/26/2024).    Risks, benefits, and alternatives of the medications and treatment plan prescribed today were discussed, and patient expressed understanding. Plan follow-up as discussed or as needed if any worsening symptoms or change in condition.   A yearly preventative health exam was recommended and current age based recommendations were discussed.    I have reviewed the information contained in this note and personally verified its accuracy.  MDM billing - I personally developed the plan of care based on documented medical decision making. Powell LITTIE Hitchcock, FNP   Portions of this note were dictated using Animal nutritionist. It has been reviewed for accuracy, but may contain grammatical and clerical errors.    There are no Patient Instructions on file for this visit.

## 2024-07-19 ENCOUNTER — Emergency Department (HOSPITAL_COMMUNITY)
Admission: EM | Admit: 2024-07-19 | Discharge: 2024-07-20 | Disposition: A | Discharge status: Home / Self Care | Payer: MEDICAID | Attending: Emergency Medicine | Admitting: Emergency Medicine

## 2024-07-19 DIAGNOSIS — F1910 Other psychoactive substance abuse, uncomplicated: Secondary | ICD-10-CM | POA: Insufficient documentation

## 2024-07-19 DIAGNOSIS — Z7982 Long term (current) use of aspirin: Secondary | ICD-10-CM | POA: Insufficient documentation

## 2024-07-19 DIAGNOSIS — F1090 Alcohol use, unspecified, uncomplicated: Secondary | ICD-10-CM | POA: Diagnosis present

## 2024-07-19 DIAGNOSIS — Y906 Blood alcohol level of 120-199 mg/100 ml: Secondary | ICD-10-CM | POA: Insufficient documentation

## 2024-07-19 DIAGNOSIS — F1020 Alcohol dependence, uncomplicated: Secondary | ICD-10-CM | POA: Diagnosis not present

## 2024-07-19 DIAGNOSIS — Z79899 Other long term (current) drug therapy: Secondary | ICD-10-CM | POA: Insufficient documentation

## 2024-07-19 LAB — COMPREHENSIVE METABOLIC PANEL WITH GFR
ALT: 21 U/L (ref 0–44)
AST: 28 U/L (ref 15–41)
Albumin: 3.9 g/dL (ref 3.5–5.0)
Alkaline Phosphatase: 60 U/L (ref 38–126)
Anion gap: 12 (ref 5–15)
BUN: 7 mg/dL — ABNORMAL LOW (ref 8–23)
CO2: 22 mmol/L (ref 22–32)
Calcium: 9.1 mg/dL (ref 8.9–10.3)
Chloride: 102 mmol/L (ref 98–111)
Creatinine, Ser: 1.18 mg/dL (ref 0.61–1.24)
GFR, Estimated: >60 mL/min (ref >60)
Glucose, Bld: 88 mg/dL (ref 70–99)
Potassium: 3.9 mmol/L (ref 3.5–5.1)
Sodium: 136 mmol/L (ref 135–145)
Total Bilirubin: 1.1 mg/dL (ref 0.0–1.2)
Total Protein: 7.1 g/dL (ref 6.5–8.1)

## 2024-07-19 LAB — CBC
HCT: 49 % (ref 39.0–52.0)
Hemoglobin: 16.5 g/dL (ref 13.0–17.0)
MCH: 31.4 pg (ref 26.0–34.0)
MCHC: 33.7 g/dL (ref 30.0–36.0)
MCV: 93.3 fL (ref 80.0–100.0)
Platelets: 372 K/uL (ref 150–400)
RBC: 5.25 MIL/uL (ref 4.22–5.81)
RDW: 13.9 % (ref 11.5–15.5)
WBC: 7.8 K/uL (ref 4.0–10.5)
nRBC: 0 % (ref 0.0–0.2)

## 2024-07-19 LAB — ETHANOL: Alcohol, Ethyl (B): 181 mg/dL — ABNORMAL HIGH (ref <15)

## 2024-07-19 LAB — RAPID URINE DRUG SCREEN, HOSP PERFORMED
Amphetamines: NOT DETECTED
Barbiturates: NOT DETECTED
Benzodiazepines: POSITIVE — AB
Cocaine: POSITIVE — AB
Opiates: NOT DETECTED
Tetrahydrocannabinol: POSITIVE — AB

## 2024-07-19 NOTE — ED Notes (Signed)
 Patient refused EKG.

## 2024-07-19 NOTE — ED Notes (Signed)
 Pt refusing EKG at this time

## 2024-07-19 NOTE — ED Triage Notes (Signed)
 Patient arrived with EMS from street ( homeless) reports SI plans to walk in front of moving vehicles , also requesting detox for his alcoholism and Cocaine addiction . No hallucinations .

## 2024-07-20 NOTE — ED Notes (Signed)
 Security escorted pt off premises.

## 2024-07-20 NOTE — ED Notes (Addendum)
 Pt became verbally combative with staff. Security at bedside. Coming to the nurses station and accusing staff of eating his food. Pt is cussing and yelling. EDP aware.

## 2024-07-20 NOTE — Discharge Instructions (Addendum)
 As we discussed, I can provide you with a resource guide to places that can help with your polysubstance abuse.   If you feel you need immediate treatment, you can be seen at [facility] for substance abuse issues.   You are felt stable from a medical standpoint to be discharged and follow up in the outpatient setting.

## 2024-07-20 NOTE — ED Provider Notes (Signed)
 [place] EMERGENCY DEPARTMENT AT [facility] Provider Note   CSN: [id number] Arrival date & time: 07/19/24  2231     Patient presents with: Suicidal, Addiction Problem, and Alcoholism    Darrell [name] is a 63 y.o. male.   Patient to ED from the street stating he needs to be placed in inpatient rehab for drugs and alcohol. He reports history of alcohol withdrawal with severe symptoms. Last alcohol use today. When asked if has SI/HI, he reports if you don't place me in rehab then I'm suicidal.  The history is provided by the patient. No language interpreter was used.       Prior to Admission medications   Medication Sig Start Date End Date Taking? Authorizing Provider  aspirin  81 MG EC tablet Take 1 tablet (81 mg total) by mouth daily. For heart health 03/07/17   Pucilowska, Jolanta B, MD  atorvastatin  (LIPITOR ) 80 MG tablet Take 1 tablet (80 mg total) by mouth daily. For high cholesterol 03/07/17   Pucilowska, Jolanta B, MD  gabapentin  (NEURONTIN ) 400 MG capsule Take 2 capsules (800 mg total) by mouth 3 (three) times daily. For agitation/substance withdrawal symptoms Patient not taking: Reported on 03/27/2020 03/07/17   Pucilowska, Jolanta B, MD  gabapentin  (NEURONTIN ) 600 MG tablet Take 600 mg by mouth 3 (three) times daily.    [provider]  Multiple Vitamin (MULTIVITAMIN WITH MINERALS) TABS tablet Take 1 tablet by mouth daily. For low Vitamin Patient not taking: Reported on 03/27/2020 03/07/17   Pucilowska, Jolanta B, MD  vitamin B-12 (CYANOCOBALAMIN) 1000 MCG tablet Take 1,000 mcg by mouth daily.    [provider]    Allergies: Patient has no known allergies.    Review of Systems  Updated Vital Signs BP (!) 148/109 (BP Location: Right Arm)   Pulse (!) 106   Temp 98 F (36.7 C) (Oral)   Resp 18   SpO2 97%   Physical Exam Constitutional:      Appearance: He is well-developed.  Pulmonary:     Effort: Pulmonary effort is normal.   Musculoskeletal:        General: Normal range of motion.     Cervical back: Normal range of motion.  Skin:    General: Skin is warm and dry.  Neurological:     Mental Status: He is alert and oriented to person, place, and time.  Psychiatric:        Attention and Perception: He does not perceive auditory or visual hallucinations.        Mood and Affect: Mood normal.        Speech: Speech normal.        Behavior: Behavior is cooperative.        Thought Content: Thought content does not include homicidal or suicidal ideation.     (all labs ordered are listed, but only abnormal results are displayed) Labs Reviewed  COMPREHENSIVE METABOLIC PANEL WITH GFR - Abnormal; Notable for the following components:      Result Value   BUN 7 (*)    All other components within normal limits  ETHANOL - Abnormal; Notable for the following components:   Alcohol, Ethyl (B) 181 (*)    All other components within normal limits  RAPID URINE DRUG SCREEN, HOSP PERFORMED - Abnormal; Notable for the following components:   Cocaine POSITIVE (*)    Benzodiazepines POSITIVE (*)    Tetrahydrocannabinol POSITIVE (*)    All other components within normal limits  CBC  EKG: None  Radiology: No results found.   Procedures   Medications Ordered in the ED - No data to display  Clinical Course as of 07/20/24 0013  Sat Jul 20, 2024  0011 Patient here for placement to substance abuse inpatient rehab. Discussed that when felt stable from a medical standpoint, he will be given resources for treatment facilities.   He reported SI on arrival. To me he denies suicidal ideation or intent. He does state that if we don't place him then I am suicidal. This is felt to represent manipulation rather than genuine suicidal intent.  [SU]    Clinical Course User Index [SU] Odell Balls, PA-C                                 Medical Decision Making Amount and/or Complexity of Data Reviewed Labs:  ordered.        Final diagnoses:  Polysubstance abuse (HCC)  Uncomplicated alcohol dependence [facility])    ED Discharge Orders     None          Odell Balls, PA-C 07/20/24 0013    Midge Golas, MD 07/20/24 [phone]

## 2024-07-30 ENCOUNTER — Encounter (HOSPITAL_COMMUNITY): Payer: Self-pay

## 2024-07-30 ENCOUNTER — Emergency Department (HOSPITAL_COMMUNITY)
Admission: EM | Admit: 2024-07-30 | Discharge: 2024-07-31 | Disposition: A | Discharge status: Other Acute Inpatient Hospital | Payer: MEDICAID | Attending: Student | Admitting: Student

## 2024-07-30 ENCOUNTER — Other Ambulatory Visit: Payer: Self-pay

## 2024-07-30 DIAGNOSIS — I129 Hypertensive chronic kidney disease with stage 1 through stage 4 chronic kidney disease, or unspecified chronic kidney disease: Secondary | ICD-10-CM | POA: Diagnosis not present

## 2024-07-30 DIAGNOSIS — Y908 Blood alcohol level of 240 mg/100 ml or more: Secondary | ICD-10-CM | POA: Diagnosis not present

## 2024-07-30 DIAGNOSIS — F1010 Alcohol abuse, uncomplicated: Secondary | ICD-10-CM | POA: Diagnosis present

## 2024-07-30 DIAGNOSIS — Z79899 Other long term (current) drug therapy: Secondary | ICD-10-CM | POA: Insufficient documentation

## 2024-07-30 DIAGNOSIS — R45851 Suicidal ideations: Secondary | ICD-10-CM | POA: Diagnosis not present

## 2024-07-30 DIAGNOSIS — F17210 Nicotine dependence, cigarettes, uncomplicated: Secondary | ICD-10-CM | POA: Insufficient documentation

## 2024-07-30 DIAGNOSIS — I2510 Atherosclerotic heart disease of native coronary artery without angina pectoris: Secondary | ICD-10-CM | POA: Insufficient documentation

## 2024-07-30 DIAGNOSIS — E876 Hypokalemia: Secondary | ICD-10-CM | POA: Insufficient documentation

## 2024-07-30 DIAGNOSIS — Z7982 Long term (current) use of aspirin: Secondary | ICD-10-CM | POA: Diagnosis not present

## 2024-07-30 DIAGNOSIS — N182 Chronic kidney disease, stage 2 (mild): Secondary | ICD-10-CM | POA: Insufficient documentation

## 2024-07-30 MED ORDER — FOLIC ACID 1 MG PO TABS
1.0000 mg | ORAL_TABLET | Freq: Every day | ORAL | Status: DC
  Administered 2024-07-31: 1 mg via ORAL
  Filled 2024-07-30: qty 1

## 2024-07-30 MED ORDER — LORAZEPAM 1 MG PO TABS
1.0000 mg | ORAL_TABLET | ORAL | Status: DC | PRN
  Administered 2024-07-31: 3 mg via ORAL
  Filled 2024-07-30: qty 3

## 2024-07-30 MED ORDER — THIAMINE MONONITRATE 100 MG PO TABS
100.0000 mg | ORAL_TABLET | Freq: Every day | ORAL | Status: DC
  Administered 2024-07-31: 100 mg via ORAL
  Filled 2024-07-30: qty 1

## 2024-07-30 MED ORDER — ADULT MULTIVITAMIN W/MINERALS CH
1.0000 | ORAL_TABLET | Freq: Every day | ORAL | Status: DC
  Administered 2024-07-31: 1 via ORAL
  Filled 2024-07-30: qty 1

## 2024-07-30 MED ORDER — LORAZEPAM 2 MG/ML IJ SOLN
1.0000 mg | INTRAMUSCULAR | Status: DC | PRN
  Administered 2024-07-30: 1 mg via INTRAVENOUS
  Filled 2024-07-30: qty 1

## 2024-07-30 MED ORDER — THIAMINE HCL 100 MG/ML IJ SOLN: 100.0000 mg | Freq: Every day | INTRAMUSCULAR | Status: DC

## 2024-07-30 NOTE — ED Triage Notes (Signed)
 Arrives [facility] from [facility] with complaint of requesting alcohol detox.  Pt says he has had one alcoholic beverage today.
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
## 2024-07-30 NOTE — BH Assessment (Signed)
 Clinician messaged Chauncey LABOR. Foley, Paramedic: Hey. It's Trey with TTS. Is the pt able to engage in the assessment, if so the pt will need to be placed in a private room. Is the pt under IVC? Also is the pt medically cleared?    Clinician awaiting response.    Jackson JONETTA Broach, MS, [facility], [facility] Triage Specialist [phone]

## 2024-07-31 ENCOUNTER — Telehealth (HOSPITAL_COMMUNITY): Payer: Self-pay

## 2024-07-31 ENCOUNTER — Encounter (HOSPITAL_COMMUNITY): Payer: Self-pay | Admitting: Psychiatry

## 2024-07-31 ENCOUNTER — Other Ambulatory Visit (HOSPITAL_COMMUNITY)
Admission: EM | Admit: 2024-07-31 | Discharge: 2024-08-05 | Disposition: A | Discharge status: Home / Self Care | Source: Intra-hospital | Attending: Psychiatry | Admitting: Psychiatry

## 2024-07-31 DIAGNOSIS — F1994 Other psychoactive substance use, unspecified with psychoactive substance-induced mood disorder: Secondary | ICD-10-CM

## 2024-07-31 DIAGNOSIS — F1010 Alcohol abuse, uncomplicated: Secondary | ICD-10-CM

## 2024-07-31 DIAGNOSIS — F10230 Alcohol dependence with withdrawal, uncomplicated: Secondary | ICD-10-CM | POA: Insufficient documentation

## 2024-07-31 DIAGNOSIS — F1920 Other psychoactive substance dependence, uncomplicated: Secondary | ICD-10-CM

## 2024-07-31 DIAGNOSIS — Z79899 Other long term (current) drug therapy: Secondary | ICD-10-CM | POA: Insufficient documentation

## 2024-07-31 DIAGNOSIS — I129 Hypertensive chronic kidney disease with stage 1 through stage 4 chronic kidney disease, or unspecified chronic kidney disease: Secondary | ICD-10-CM | POA: Insufficient documentation

## 2024-07-31 DIAGNOSIS — T43506A Underdosing of unspecified antipsychotics and neuroleptics, initial encounter: Secondary | ICD-10-CM | POA: Insufficient documentation

## 2024-07-31 DIAGNOSIS — F1020 Alcohol dependence, uncomplicated: Secondary | ICD-10-CM | POA: Diagnosis present

## 2024-07-31 DIAGNOSIS — N189 Chronic kidney disease, unspecified: Secondary | ICD-10-CM | POA: Insufficient documentation

## 2024-07-31 DIAGNOSIS — Z955 Presence of coronary angioplasty implant and graft: Secondary | ICD-10-CM | POA: Insufficient documentation

## 2024-07-31 DIAGNOSIS — E781 Pure hyperglyceridemia: Secondary | ICD-10-CM | POA: Insufficient documentation

## 2024-07-31 DIAGNOSIS — F10930 Alcohol use, unspecified with withdrawal, uncomplicated: Secondary | ICD-10-CM

## 2024-07-31 DIAGNOSIS — Z5900 Homelessness unspecified: Secondary | ICD-10-CM | POA: Insufficient documentation

## 2024-07-31 DIAGNOSIS — I2510 Atherosclerotic heart disease of native coronary artery without angina pectoris: Secondary | ICD-10-CM | POA: Insufficient documentation

## 2024-07-31 DIAGNOSIS — E876 Hypokalemia: Secondary | ICD-10-CM | POA: Insufficient documentation

## 2024-07-31 DIAGNOSIS — F17200 Nicotine dependence, unspecified, uncomplicated: Secondary | ICD-10-CM | POA: Insufficient documentation

## 2024-07-31 DIAGNOSIS — Z91128 Patient's intentional underdosing of medication regimen for other reason: Secondary | ICD-10-CM | POA: Insufficient documentation

## 2024-07-31 DIAGNOSIS — M5440 Lumbago with sciatica, unspecified side: Secondary | ICD-10-CM | POA: Insufficient documentation

## 2024-07-31 DIAGNOSIS — F1024 Alcohol dependence with alcohol-induced mood disorder: Secondary | ICD-10-CM | POA: Insufficient documentation

## 2024-07-31 LAB — CBC WITH DIFFERENTIAL/PLATELET
Abs Immature Granulocytes: 0.01 K/uL (ref 0.00–0.07)
Basophils Absolute: 0 K/uL (ref 0.0–0.1)
Basophils Relative: 1 %
Eosinophils Absolute: 0.1 K/uL (ref 0.0–0.5)
Eosinophils Relative: 1 %
HCT: 51.6 % (ref 39.0–52.0)
Hemoglobin: 17.2 g/dL — ABNORMAL HIGH (ref 13.0–17.0)
Immature Granulocytes: 0 %
Lymphocytes Relative: 49 %
Lymphs Abs: 2.9 K/uL (ref 0.7–4.0)
MCH: 31 pg (ref 26.0–34.0)
MCHC: 33.3 g/dL (ref 30.0–36.0)
MCV: 93.1 fL (ref 80.0–100.0)
Monocytes Absolute: 0.7 K/uL (ref 0.1–1.0)
Monocytes Relative: 11 %
Neutro Abs: 2.3 K/uL (ref 1.7–7.7)
Neutrophils Relative %: 38 %
Platelets: 195 K/uL (ref 150–400)
RBC: 5.54 MIL/uL (ref 4.22–5.81)
RDW: 13.8 % (ref 11.5–15.5)
WBC: 6.1 K/uL (ref 4.0–10.5)
nRBC: 0 % (ref 0.0–0.2)

## 2024-07-31 LAB — COMPREHENSIVE METABOLIC PANEL WITH GFR
ALT: 21 U/L (ref 0–44)
AST: 28 U/L (ref 15–41)
Albumin: 3.9 g/dL (ref 3.5–5.0)
Alkaline Phosphatase: 67 U/L (ref 38–126)
Anion gap: 13 (ref 5–15)
BUN: <5 mg/dL — ABNORMAL LOW (ref 8–23)
CO2: 25 mmol/L (ref 22–32)
Calcium: 8.7 mg/dL — ABNORMAL LOW (ref 8.9–10.3)
Chloride: 100 mmol/L (ref 98–111)
Creatinine, Ser: 0.82 mg/dL (ref 0.61–1.24)
GFR, Estimated: >60 mL/min (ref >60)
Glucose, Bld: 109 mg/dL — ABNORMAL HIGH (ref 70–99)
Potassium: 3.3 mmol/L — ABNORMAL LOW (ref 3.5–5.1)
Sodium: 138 mmol/L (ref 135–145)
Total Bilirubin: 0.5 mg/dL (ref 0.0–1.2)
Total Protein: 7.3 g/dL (ref 6.5–8.1)

## 2024-07-31 LAB — URINALYSIS, ROUTINE W REFLEX MICROSCOPIC
Bilirubin Urine: NEGATIVE
Glucose, UA: NEGATIVE mg/dL
Hgb urine dipstick: NEGATIVE
Ketones, ur: NEGATIVE mg/dL
Leukocytes,Ua: NEGATIVE
Nitrite: NEGATIVE
Protein, ur: NEGATIVE mg/dL
Specific Gravity, Urine: 1.003 — ABNORMAL LOW (ref 1.005–1.030)
pH: 6 (ref 5.0–8.0)

## 2024-07-31 LAB — RAPID URINE DRUG SCREEN, HOSP PERFORMED
Amphetamines: POSITIVE — AB
Barbiturates: NOT DETECTED
Benzodiazepines: NOT DETECTED
Cocaine: NOT DETECTED
Opiates: NOT DETECTED
Tetrahydrocannabinol: POSITIVE — AB

## 2024-07-31 LAB — ETHANOL: Alcohol, Ethyl (B): 302 mg/dL (ref <15)

## 2024-07-31 MED ORDER — DIPHENHYDRAMINE HCL 50 MG/ML IJ SOLN: 50.0000 mg | Freq: Three times a day (TID) | INTRAMUSCULAR | Status: DC | PRN

## 2024-07-31 MED ORDER — LORAZEPAM 1 MG PO TABS: 1.0000 mg | ORAL_TABLET | ORAL | Status: AC | PRN

## 2024-07-31 MED ORDER — MAGNESIUM HYDROXIDE 400 MG/5ML PO SUSP: 30.0000 mL | Freq: Every day | ORAL | Status: DC | PRN

## 2024-07-31 MED ORDER — ACETAMINOPHEN 325 MG PO TABS
650.0000 mg | ORAL_TABLET | Freq: Four times a day (QID) | ORAL | Status: DC | PRN
  Administered 2024-08-02 – 2024-08-04 (×6): 650 mg via ORAL
  Filled 2024-07-31 (×6): qty 2

## 2024-07-31 MED ORDER — LORAZEPAM 2 MG/ML IJ SOLN: 1.0000 mg | INTRAMUSCULAR | Status: DC | PRN

## 2024-07-31 MED ORDER — ADULT MULTIVITAMIN W/MINERALS CH
1.0000 | ORAL_TABLET | Freq: Every day | ORAL | Status: DC
  Administered 2024-08-01 – 2024-08-05 (×6): 1 via ORAL
  Filled 2024-07-31 (×2): qty 1
  Filled 2024-07-31: qty 7
  Filled 2024-07-31 (×3): qty 1

## 2024-07-31 MED ORDER — HALOPERIDOL LACTATE 5 MG/ML IJ SOLN: 10.0000 mg | Freq: Three times a day (TID) | INTRAMUSCULAR | Status: DC | PRN

## 2024-07-31 MED ORDER — POTASSIUM CHLORIDE CRYS ER 20 MEQ PO TBCR
40.0000 meq | EXTENDED_RELEASE_TABLET | Freq: Once | ORAL | Status: AC
  Administered 2024-07-31: 40 meq via ORAL
  Filled 2024-07-31: qty 2

## 2024-07-31 MED ORDER — LISINOPRIL 20 MG PO TABS
20.0000 mg | ORAL_TABLET | Freq: Once | ORAL | Status: AC
  Administered 2024-07-31: 20 mg via ORAL
  Filled 2024-07-31: qty 1

## 2024-07-31 MED ORDER — LORAZEPAM 2 MG/ML IJ SOLN: 2.0000 mg | Freq: Three times a day (TID) | INTRAMUSCULAR | Status: DC | PRN

## 2024-07-31 MED ORDER — LORAZEPAM 0.5 MG PO TABS
0.5000 mg | ORAL_TABLET | Freq: Every day | ORAL | Status: AC
  Administered 2024-08-02 – 2024-08-03 (×2): 0.5 mg via ORAL
  Filled 2024-07-31 (×2): qty 1

## 2024-07-31 MED ORDER — CLONIDINE HCL 0.1 MG PO TABS
0.1000 mg | ORAL_TABLET | Freq: Once | ORAL | Status: AC
  Administered 2024-07-31: 0.1 mg via ORAL
  Filled 2024-07-31: qty 1

## 2024-07-31 MED ORDER — DIPHENHYDRAMINE HCL 50 MG PO CAPS
50.0000 mg | ORAL_CAPSULE | Freq: Three times a day (TID) | ORAL | Status: DC | PRN
  Administered 2024-08-03 – 2024-08-05 (×3): 50 mg via ORAL
  Filled 2024-07-31 (×3): qty 1

## 2024-07-31 MED ORDER — FOLIC ACID 1 MG PO TABS
1.0000 mg | ORAL_TABLET | Freq: Every day | ORAL | Status: DC
  Administered 2024-08-01 – 2024-08-05 (×6): 1 mg via ORAL
  Filled 2024-07-31 (×5): qty 1

## 2024-07-31 MED ORDER — HALOPERIDOL 5 MG PO TABS
5.0000 mg | ORAL_TABLET | Freq: Three times a day (TID) | ORAL | Status: DC | PRN
  Filled 2024-07-31 (×2): qty 1

## 2024-07-31 MED ORDER — MAGNESIUM OXIDE -MG SUPPLEMENT 400 (240 MG) MG PO TABS
800.0000 mg | ORAL_TABLET | Freq: Once | ORAL | Status: AC
  Administered 2024-07-31: 800 mg via ORAL
  Filled 2024-07-31: qty 2

## 2024-07-31 MED ORDER — HALOPERIDOL LACTATE 5 MG/ML IJ SOLN: 5.0000 mg | Freq: Three times a day (TID) | INTRAMUSCULAR | Status: DC | PRN

## 2024-07-31 MED ORDER — LORAZEPAM 0.5 MG PO TABS
0.5000 mg | ORAL_TABLET | Freq: Three times a day (TID) | ORAL | Status: AC
  Administered 2024-08-01 – 2024-08-02 (×3): 0.5 mg via ORAL
  Filled 2024-07-31 (×3): qty 1

## 2024-07-31 MED ORDER — LORAZEPAM 1 MG PO TABS
1.0000 mg | ORAL_TABLET | Freq: Three times a day (TID) | ORAL | Status: AC
  Administered 2024-07-31 – 2024-08-01 (×3): 1 mg via ORAL
  Filled 2024-07-31 (×3): qty 1

## 2024-07-31 MED ORDER — ALUM & MAG HYDROXIDE-SIMETH 200-200-20 MG/5ML PO SUSP: 30.0000 mL | ORAL | Status: DC | PRN

## 2024-07-31 MED ORDER — THIAMINE HCL 100 MG/ML IJ SOLN: 100.0000 mg | Freq: Every day | INTRAMUSCULAR | Status: DC

## 2024-07-31 MED ORDER — THIAMINE MONONITRATE 100 MG PO TABS
100.0000 mg | ORAL_TABLET | Freq: Every day | ORAL | Status: DC
  Administered 2024-08-01 – 2024-08-05 (×6): 100 mg via ORAL
  Filled 2024-07-31 (×5): qty 1

## 2024-07-31 NOTE — ED Notes (Addendum)
 Report to Annemarie at [facility], pt is able to transfer there after 1430

## 2024-07-31 NOTE — ED Notes (Signed)
 Patient was called to dinner but did not get up.  He is easily awakened however is very tired and falls to sleep quickly.  Ciwa is  5

## 2024-07-31 NOTE — ED Notes (Signed)
 Patient blood pressure remains elevated.  149/104 24  Dr Lawrnce aware and is ordering lisinopril  20mg  now as this is in his home medications.
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
## 2024-07-31 NOTE — BH Assessment (Signed)
 Per Chauncey LABOR. Foley, Paramedic pt is not medically cleared. Clinician asked Paramedic inform us  when the pt is cleared and able to engage in the TTS assessment.    Jackson JONETTA Broach, MS, [facility], [facility] Triage Specialist [phone]

## 2024-07-31 NOTE — ED Notes (Signed)
 Patient admitted to [facility] from [facility] due to etoh intoxication and need for detox.  Patient malodorous and disheveled.  Patient changed into scrubs and clothes are being washed.  Patient irritable with admission process but did complete it.  He was then shown to his room after a brief unit orientation.  Patient is now resting in bed.  Patient CIWA = 5 and his blood pressure and pulse are elevated.  161/101 108 Dr Leigh made aware.  Patient started on an ativan  taper.  He denies avh shi or plan.  Will monitor.

## 2024-07-31 NOTE — ED Notes (Signed)
 Patient v/s retaken and no change.  Dr Lawrnce made aware and 0.1mg  clonidine  ordered and given to patient who remains somnolent.
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
## 2024-07-31 NOTE — Progress Notes (Signed)
 Patient has been accepted to [facility] Room 158  [place] Sherol Sabas LCSW-A   07/31/2024 12:24 PM

## 2024-07-31 NOTE — Progress Notes (Signed)
 ICM consulted for substance abuse resources. Resources attached to AVS. Pt is under TTS care at this time. No further ICM needs.

## 2024-07-31 NOTE — BH Assessment (Signed)
 TTS Clinician attempted to complete assessment. Patient continued to fall asleep and snoring. Per Chauncey, paramedic, pt was given IV Ativan . Patient will be seen after shift change.

## 2024-07-31 NOTE — ED Notes (Addendum)
 Patient observed/assessed in patient room lying down asleep in bed. Patient aroused with verbal stimulation after several attempts to which patient responded in an agitated/annoyed demeanor. Patient oriented x 3. Affect is flat/agitated and eye contact is minimal. Patient evasive to preliminary assessment questions. Denies S/I and H/I. Denies A/V/H. Pt states, he is not feeling and would want to be left alone to sleep.

## 2024-07-31 NOTE — Progress Notes (Signed)
 TTS clinician at bedside.

## 2024-07-31 NOTE — ED Notes (Signed)
 Patient is sleeping. Respirations equal and unlabored, skin warm and dry. No change in assessment or acuity. Routine safety checks conducted according to facility protocol.

## 2024-07-31 NOTE — Consult Note (Signed)
 [facility] Psychiatric Consult Initial  Darrell [name] Name: .Darrell Darrell [name]  MRN: [id number]  DOB: June 19, 1961  Consult Order details:  Orders (From admission, onward)     Start     Ordered   07/30/24 2335  CONSULT TO CALL ACT TEAM       Ordering Provider: Albertina Dixon, MD  Provider:  (Not yet assigned)  Question:  Reason for Consult?  Answer:  SI   07/30/24 2334             Mode of Visit: In person    Psychiatry Consult Evaluation  Service Date: July 31, 2024 LOS:  LOS: 0 days  Chief Complaint I need detox  Primary Psychiatric Diagnoses  Alcohol abuse   Assessment  Darrell Darrell [name] is a 63 y.o. male admitted: Presented to the EDfor 07/30/2024 11:01 PM for brought in by EMS from [facility] requesting alcohol detox. He carries the psychiatric diagnoses of MDD, alcohol abuse, bipolar 1 disorder, auditory hallucinations, polysubstance abuse, Cluster B personality traits and malingering and has a past medical history of  CKD, GERD, HTN, HepC, CAD, chronic back pain and obesity.   His current presentation of requesting alcohol detox is most consistent with alcohol abuse. He meets criteria for inpatient detox based on current daily alcohol use.  Current outpatient psychotropic medications include none and historically he has had a non response to these medications. He was compliant with medications prior to admission as evidenced by Darrell [name] states he is not taking anything. On initial examination, Darrell [name] is cooperative and asking for detox. Please see plan below for detailed recommendations.   Diagnoses:  Active Hospital problems: Principal Problem:   Alcohol abuse    Plan   ## Psychiatric Medication Recommendations:  --CIWA protocol  ## Medical Decision Making Capacity: Not specifically addressed in this encounter  ## Further Work-up:  -- most recent EKG on 03/27/20 had QtC of 476; updated EKG pending -- Pertinent labwork reviewed earlier this admission includes: CBC,  CMP, alcohol, UA and UDS   ## Disposition:-- We recommend inpatient detoxification.  ## Behavioral / Environmental: - No specific recommendations at this time.     ## Safety and Observation Level:  - Based on my clinical evaluation, I estimate the Darrell [name] to be at low risk of self harm in the current setting. - At this time, we recommend  routine. This decision is based on my review of the chart including Darrell [name]'s history and current presentation, interview of the Darrell [name], mental status examination, and consideration of suicide risk including evaluating suicidal ideation, plan, intent, suicidal or self-harm behaviors, risk factors, and protective factors. This judgment is based on our ability to directly address suicide risk, implement suicide prevention strategies, and develop a safety plan while the Darrell [name] is in the clinical setting. Please contact our team if there is a concern that risk level has changed.  CSSR Risk Category:C-SSRS RISK CATEGORY: No Risk  Suicide Risk Assessment: Darrell [name] has following modifiable risk factors for suicide: , which we are addressing by recommending inpatient detox and outpatient follow up. Darrell [name] has following non-modifiable or demographic risk factors for suicide: male gender and psychiatric hospitalization Darrell [name] has the following protective factors against suicide: Supportive family  Thank you for this consult request. Recommendations have been communicated to the primary team.  We will continue to follow at this time.   Darrell Darrell [name] Patient, NP       History of Present Illness  Relevant Aspects of Hospital ED Course:  Admitted on  07/30/2024 for brought in by EMS from [facility] requesting alcohol detox. He carries the psychiatric diagnoses of MDD, alcohol abuse, bipolar 1 disorder, auditory hallucinations, polysubstance abuse, Cluster B personality traits and malingering and has a past medical history of  CKD, GERD, HTN, HepC, CAD, chronic back pain and  obesity.   Darrell [name] Report:  Darrell Darrell [name], is seen face to face by this provider, consulted with Dr. Larina; and chart reviewed on 07/31/24.  On evaluation Darrell Darrell [name] reports he wants inpatient detox from alcohol.  During evaluation Darrell Darrell [name] is laying on a stretcher in no acute distress.  He is alert & oriented x 4, calm, cooperative and attentive for this assessment.  His mood is depressed with congruent affect.  He has normal speech, and behavior.  Objectively there is no evidence of psychosis/mania or delusional thinking. Pt does not appear to be responding to internal or external stimuli.  Darrell [name] is able to converse coherently, goal directed thoughts, no distractibility, or pre-occupation.  He denies suicidal/self-harm/homicidal ideation, psychosis, and paranoia.  Darrell [name] answered questions appropriately.    I personally spent a total of 55 minutes in the care of the Darrell [name] today including preparing to see the Darrell [name], getting/reviewing separately obtained history, performing a medically appropriate exam/evaluation, counseling and educating, placing orders, referring and communicating with other health care professionals, documenting clinical information in the EHR, independently interpreting results, communicating results, and coordinating care.  Psych ROS:  Depression: endorses Anxiety:  endorses Mania (lifetime and current): denies Psychosis: (lifetime and current): denies  Review of Systems  Psychiatric/Behavioral:  Positive for depression and substance abuse.   All other systems reviewed and are negative.    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 Living Situation: Homeless Spiritual Hx: none Access to weapons/lethal means: denies   Substance History Alcohol: endorses  Last Drink 07/30/2024 Number of drinks per day unknown History of alcohol withdrawal seizures denies History of DT's endorses Tobacco: endorses Illicit drugs: UDS+ for amphetamines and THC Prescription drug abuse: denies Rehab hx: yes  Exam Findings  Physical Exam:  Vital Signs:  Temp:  [97.8 F (36.6 C)-98.6 F (37 C)] 98.6 F (37 C) (08/06 0942) Pulse Rate:  [83-103] 103 (08/06 0942) Resp:  [16-20] 18 (08/06 0942) BP: (142-172)/(91-113) 160/107 (08/06 0942) SpO2:  [95 %-100 %] 98 % (08/06 0942) Weight:  [81.6 kg] 81.6 kg (08/05 2308) Blood pressure (!) 160/107, pulse (!) 103, temperature 98.6 F (37 C), temperature source Oral, resp. rate 18, height 5' 11 (1.803 m), weight 81.6 kg, SpO2 98%. Body mass index is 25.1 kg/m.  Physical Exam Vitals and nursing note reviewed.  Eyes:     Pupils: Pupils are equal, round, and reactive to light.  Pulmonary:     Effort: Pulmonary effort is normal.  Skin:    General: Skin is dry.  Neurological:     Mental Status: He is alert and oriented to person, place, and time.  Psychiatric:        Attention  and Perception: Attention and perception normal.        Mood and Affect: Mood is depressed.        Speech: Speech normal.        Behavior: Behavior is cooperative.        Thought Content: Thought content normal.        Cognition and Memory: Cognition and memory normal.        Judgment: Judgment is impulsive.     Mental Status Exam: General Appearance: Disheveled  Orientation:  Full (Time, Place, and Person)  Memory:   Immediate;   Fair Recent;   Fair Remote;   Fair  Concentration:  Concentration: Fair  Recall:  Fair  Attention  Fair  Eye Contact:  Minimal  Speech:  Clear and Coherent  Language:  Fair  Volume:  Decreased  Mood: depressed  Affect:  Congruent  Thought Process:  Goal Directed  Thought Content:  Logical  Suicidal Thoughts:  No  Homicidal Thoughts:  No  Judgement:  Impaired  Insight:  Lacking  Psychomotor Activity:  Normal  Akathisia:  No  Fund of Knowledge:  Fair      Assets:  Desire for Improvement  Cognition:  WNL  ADL's:  Intact  AIMS (if indicated):        Other History   These have been pulled in through the EMR, reviewed, and updated if appropriate.  Family History:  The Darrell [name]'s family history includes Alcohol abuse in his unknown relative; Cancer in his unknown relative; Cystic fibrosis in his unknown relative; Heart attack in his unknown relative.  Medical History: Past Medical History:  Diagnosis Date   Alcohol abuse    Anxiety    Chronic pain    Hepatitis C    History of heroin abuse (HCC)    Myocardial infarction (HCC)    Tuberculosis     Surgical History: Past Surgical History:  Procedure Laterality Date   CORONARY STENT PLACEMENT       Medications:   Current Facility-Administered Medications:    folic acid  (FOLVITE ) tablet 1 mg, 1 mg, Oral, Daily, Kommor, Madison, MD, 1 mg at 07/31/24 9065   LORazepam  (ATIVAN ) tablet 1-4 mg, 1-4 mg, Oral, Q1H PRN, 3 mg at 07/31/24 1058 **OR** LORazepam  (ATIVAN ) injection 1-4 mg, 1-4 mg, Intravenous, Q1H PRN, Kommor, Madison, MD, 1 mg at 07/30/24 2354   multivitamin with minerals tablet 1 tablet, 1 tablet, Oral, Daily, Kommor, Madison, MD, 1 tablet at 07/31/24 9066   thiamine  (VITAMIN B1) tablet 100 mg, 100 mg, Oral, Daily, 100 mg at 07/31/24 0934 **OR** thiamine  (VITAMIN B1) injection 100 mg, 100 mg, Intravenous, Daily, Kommor, Madison, MD  Current Outpatient Medications:    aspirin  81 MG EC tablet, Take 1  tablet (81 mg total) by mouth daily. For heart health, Disp: 30 tablet, Rfl: 1   atorvastatin  (LIPITOR ) 80 MG tablet, Take 1 tablet (80 mg total) by mouth daily. For high cholesterol, Disp: 30 tablet, Rfl: 1   gabapentin  (NEURONTIN ) 400 MG capsule, Take 2 capsules (800 mg total) by mouth 3 (three) times daily. For agitation/substance withdrawal symptoms (Darrell [name] not taking: Reported on 03/27/2020), Disp: 180 capsule, Rfl: 0   gabapentin  (NEURONTIN ) 600 MG tablet, Take 600 mg by mouth 3 (three) times daily., Disp: , Rfl:    Multiple Vitamin (MULTIVITAMIN WITH MINERALS) TABS tablet, Take 1 tablet by mouth daily. For low Vitamin (Darrell [name] not taking: Reported on 03/27/2020), Disp: 30 tablet, Rfl: 1   vitamin B-12 (CYANOCOBALAMIN) 1000 MCG tablet,  Take 1,000 mcg by mouth daily., Disp: , Rfl:   Allergies: Allergies  Allergen Reactions   Phenobarbital Anaphylaxis   Chlordiazepoxide  Hcl Nausea And Vomiting and Rash    Chills    Darrell Darrell [name] Patient, NP

## 2024-07-31 NOTE — ED Notes (Signed)
 [facility] called for transport to [facility] on [address]

## 2024-08-01 MED ORDER — LISINOPRIL 20 MG PO TABS
20.0000 mg | ORAL_TABLET | Freq: Every day | ORAL | Status: DC
  Administered 2024-08-01 – 2024-08-05 (×6): 20 mg via ORAL
  Filled 2024-08-01: qty 7
  Filled 2024-08-01 (×5): qty 1

## 2024-08-01 MED ORDER — NICOTINE 21 MG/24HR TD PT24
21.0000 mg | MEDICATED_PATCH | Freq: Every day | TRANSDERMAL | Status: DC
  Administered 2024-08-01 – 2024-08-04 (×3): 21 mg via TRANSDERMAL
  Filled 2024-08-01 (×3): qty 1
  Filled 2024-08-01: qty 7
  Filled 2024-08-01 (×3): qty 1

## 2024-08-01 MED ORDER — GABAPENTIN 400 MG PO CAPS
400.0000 mg | ORAL_CAPSULE | Freq: Three times a day (TID) | ORAL | Status: DC
  Administered 2024-08-01 – 2024-08-02 (×3): 400 mg via ORAL
  Filled 2024-08-01 (×3): qty 1
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
## 2024-08-01 NOTE — ED Notes (Signed)
 Patient is in the bedroom sleeping with eyes closed. NAD. Denies SI/HI/AVH. Environment secured per policy. Respirations even and unlabored. Will monitor for safety.

## 2024-08-01 NOTE — ED Notes (Signed)
 Patient woke up asking for milk because he felt hungry. Milk provided by [facility]
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
## 2024-08-01 NOTE — ED Notes (Signed)
 Patient remains asleep in bed at this time.  No complaints.  Breathing unlabored and evem.  Will monitor.

## 2024-08-01 NOTE — ED Notes (Signed)
 Patient remains asleep in bed.  He got up for lunch but did not eat it.  Writer has been giving him gatorade and encouraging him to drink.  Patient CIWA is a 2.  He is guarded with dysphoric mood.  Patient is poorly related with poor eye contact and limited social interaction.Patient is tolerating ativan  taper without issue.  Will continue to monitor.

## 2024-08-01 NOTE — ED Notes (Signed)
 Pt has AM. blood work ordered, when Clinical research associate want in to draw the blood, pt informed Clinical research associate he does not want a black person taking his blood and that he is only comfortable with a white person taking his blood. Writer stop the procedure. Writer will relay information to incoming shift.

## 2024-08-01 NOTE — ED Notes (Signed)
 Pt is sleeping, no acute distress noticed.
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
## 2024-08-01 NOTE — Care Management (Signed)
 [facility] Care Management   Writer referred patient to [facility], [facility], [employer], [facility], [place], [place], [employer], [facility], [facility], [facility]
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
## 2024-08-02 DIAGNOSIS — F1024 Alcohol dependence with alcohol-induced mood disorder: Secondary | ICD-10-CM | POA: Diagnosis not present

## 2024-08-02 DIAGNOSIS — Z79899 Other long term (current) drug therapy: Secondary | ICD-10-CM | POA: Diagnosis not present

## 2024-08-02 DIAGNOSIS — Z955 Presence of coronary angioplasty implant and graft: Secondary | ICD-10-CM | POA: Diagnosis not present

## 2024-08-02 DIAGNOSIS — R45851 Suicidal ideations: Secondary | ICD-10-CM | POA: Diagnosis present

## 2024-08-02 DIAGNOSIS — E781 Pure hyperglyceridemia: Secondary | ICD-10-CM | POA: Diagnosis not present

## 2024-08-02 DIAGNOSIS — N189 Chronic kidney disease, unspecified: Secondary | ICD-10-CM | POA: Diagnosis not present

## 2024-08-02 DIAGNOSIS — T43506A Underdosing of unspecified antipsychotics and neuroleptics, initial encounter: Secondary | ICD-10-CM | POA: Diagnosis not present

## 2024-08-02 DIAGNOSIS — I2510 Atherosclerotic heart disease of native coronary artery without angina pectoris: Secondary | ICD-10-CM | POA: Diagnosis not present

## 2024-08-02 DIAGNOSIS — I129 Hypertensive chronic kidney disease with stage 1 through stage 4 chronic kidney disease, or unspecified chronic kidney disease: Secondary | ICD-10-CM | POA: Diagnosis not present

## 2024-08-02 DIAGNOSIS — M5440 Lumbago with sciatica, unspecified side: Secondary | ICD-10-CM | POA: Diagnosis not present

## 2024-08-02 DIAGNOSIS — E876 Hypokalemia: Secondary | ICD-10-CM | POA: Diagnosis not present

## 2024-08-02 DIAGNOSIS — F10230 Alcohol dependence with withdrawal, uncomplicated: Secondary | ICD-10-CM | POA: Diagnosis present

## 2024-08-02 DIAGNOSIS — Z5900 Homelessness unspecified: Secondary | ICD-10-CM | POA: Diagnosis not present

## 2024-08-02 DIAGNOSIS — F17200 Nicotine dependence, unspecified, uncomplicated: Secondary | ICD-10-CM | POA: Diagnosis not present

## 2024-08-02 DIAGNOSIS — Z91128 Patient's intentional underdosing of medication regimen for other reason: Secondary | ICD-10-CM | POA: Diagnosis not present

## 2024-08-02 LAB — BASIC METABOLIC PANEL WITH GFR
Anion gap: 15 (ref 5–15)
BUN: 7 mg/dL — ABNORMAL LOW (ref 8–23)
CO2: 22 mmol/L (ref 22–32)
Calcium: 9 mg/dL (ref 8.9–10.3)
Chloride: 101 mmol/L (ref 98–111)
Creatinine, Ser: 0.96 mg/dL (ref 0.61–1.24)
GFR, Estimated: >60 mL/min (ref >60)
Glucose, Bld: 56 mg/dL — ABNORMAL LOW (ref 70–99)
Potassium: 4.2 mmol/L (ref 3.5–5.1)
Sodium: 138 mmol/L (ref 135–145)

## 2024-08-02 MED ORDER — GABAPENTIN 400 MG PO CAPS
800.0000 mg | ORAL_CAPSULE | Freq: Three times a day (TID) | ORAL | Status: DC
  Administered 2024-08-02 – 2024-08-05 (×10): 800 mg via ORAL
  Filled 2024-08-02 (×3): qty 2
  Filled 2024-08-02: qty 42
  Filled 2024-08-02 (×6): qty 2
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
## 2024-08-02 NOTE — Care Management (Addendum)
 [facility] Care Management   Writer spoke to [facility] at [facility] and the have received the referral packet.   3:11pm  Per Rosaline at [facility] the patient has 2 Marketing executive and the third insurance provider is was [employer], {er [employer], [employer] only accepts [employer] they only accepts [employer].    3:25pm  Patient has requested [employer].  Writer provided patient with the [employer] open vacancies.
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
## 2024-08-02 NOTE — ED Notes (Addendum)
 Pt is seen in dayroom attending AA meeting. He had a snack and beverage. He denies SI/HI/AVH. He c/o HA rated 5/10. PRN Tylenol  given as per order. Pt denies dizziness. He denies other physical withdrawal symptoms. Manual BP was 130/90. NP notified and no new orders. He is safe on the unit at this time with Q 15 min safety checks in place.

## 2024-08-02 NOTE — ED Notes (Signed)
 Patient asleep CIWA assessment could not be done at this time.

## 2024-08-02 NOTE — [facility] Group Notes (Signed)
 SPIRITUALITY GROUP NOTE  Spirituality group facilitated by Chaplain Sharlett Lienemann, MDiv, [facility].  Group Description:  Group focused on topic of hope.  Patients participated in facilitated discussion around topic, connecting with one another around experiences and definitions for hope.  Group members engaged with visual explorer photos, reflecting on what hope looks like for them today.  Group engaged in discussion around how their definitions of hope are present today in hospital.   Modalities: Psycho-social ed, Adlerian, Narrative, MI Patient Progress: Darrell [name] was present for first 20 minutes of group. Left group and did not return.

## 2024-08-02 NOTE — ED Notes (Signed)
 Patient sitting in dayroom, calm and composed. No acute distress noted. No concerns voiced. No inappropriate behaviors observed or reported at this time. Informed patient to notify staff with any needs or assistance. Patient verbalized understanding or agreement. Safety checks in place per facility policy.
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
## 2024-08-02 NOTE — ED Notes (Signed)
 Patient asleep in the bedroom. NAD

## 2024-08-02 NOTE — ED Notes (Signed)
 Patient alert & oriented x4. Denies intent to harm self or others when asked. Denies A/VH. Patient reports pain in lower back rating 7/10. Patient sates pain is chronic and that at home he takes 800mg  gabapentin  3 times daily. Patient states we are giving him the incorrect dose (400 mg) and that it won't do anything to help if we don't give additional. Writer offered PRN medications to assist in pain relief, patient refused at this time. Provider Garvin Gaines, MD made aware. No acute distress noted. Scheduled medications, including 400mg  gabapentin , administered with no complications. Support and encouragement provided. Patient observed in milieu. No inappropriate behaviors observed or reported. Routine safety checks conducted per facility protocol. Encouraged patient to notify staff if any thoughts of harm towards self or others arise. Patient verbalizes understanding and agreement.

## 2024-08-03 DIAGNOSIS — F1920 Other psychoactive substance dependence, uncomplicated: Secondary | ICD-10-CM | POA: Diagnosis not present
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
## 2024-08-03 NOTE — ED Notes (Signed)
 Pt came up to the desk angry about not being able to sleep said he wants ativan  for sleep and nothing else would work. RN spoke to pt and got him to calm down.

## 2024-08-03 NOTE — ED Notes (Signed)
 Patient is in the Dayroom watching TV with other patients. Denies needing anything but irritable about some kind of phone calls. Reassured. NAD. Respirations even and unlabored. Will monitor for safety.

## 2024-08-03 NOTE — ED Notes (Signed)
Pt asleep with even and unlabored respirations. No distress/discomfort noted. Pt remains safe on unit. 

## 2024-08-03 NOTE — ED Notes (Signed)
 Patient awake and irritable complaining of not been able to sleep. RN reassured and medicated per order.

## 2024-08-03 NOTE — ED Notes (Signed)
 MHT went into the day room to let the pts know that we are to shut down the dayroom and pt cursed all staff out two MHT EVS saying that they shut it down on 11 on the weekends that's what the supervisor told them that and we need to get our shit together because we don't know what we are talking about.  All 6 pts in the room started yelling.
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
## 2024-08-03 NOTE — ED Notes (Signed)
 Patient resting quietly in bed with eyes closed. Unlabored breathing and rise and fall of chest observed. Q 15 min safety checks in place.

## 2024-08-03 NOTE — ED Notes (Signed)
 Patient woke up approximately 30 minutes ago and was irritable since this author told him to wait a few minutes for a drink. Patient complains that this facility has too many rules. The nurse has been notified.

## 2024-08-04 DIAGNOSIS — F1920 Other psychoactive substance dependence, uncomplicated: Secondary | ICD-10-CM | POA: Diagnosis not present

## 2024-08-04 DIAGNOSIS — F10930 Alcohol use, unspecified with withdrawal, uncomplicated: Secondary | ICD-10-CM | POA: Diagnosis not present

## 2024-08-04 MED ORDER — GABAPENTIN 800 MG PO TABS: 800.0000 mg | ORAL_TABLET | Freq: Three times a day (TID) | ORAL | 0 refills | Status: AC

## 2024-08-04 MED ORDER — NICOTINE 21 MG/24HR TD PT24: 21.0000 mg | MEDICATED_PATCH | Freq: Every day | TRANSDERMAL | 0 refills | Status: AC

## 2024-08-04 MED ORDER — AMLODIPINE BESYLATE 5 MG PO TABS: 5.0000 mg | ORAL_TABLET | Freq: Every day | ORAL | 0 refills | Status: DC

## 2024-08-04 MED ORDER — ADULT MULTIVITAMIN W/MINERALS CH: 1.0000 | ORAL_TABLET | Freq: Every day | ORAL | 1 refills | Status: AC

## 2024-08-04 MED ORDER — ATORVASTATIN CALCIUM 40 MG PO TABS: 40.0000 mg | ORAL_TABLET | Freq: Every day | ORAL | 0 refills | Status: DC

## 2024-08-04 MED ORDER — LISINOPRIL 20 MG PO TABS: 20.0000 mg | ORAL_TABLET | Freq: Every day | ORAL | 0 refills | Status: DC

## 2024-08-04 MED ORDER — ASPIRIN 81 MG PO TBEC: 81.0000 mg | DELAYED_RELEASE_TABLET | Freq: Every day | ORAL | 0 refills | Status: DC
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
## 2024-08-04 NOTE — ED Notes (Signed)
 Patient is in the bedroom calm and sleeping with eyes closed. NAD.
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
## 2024-08-04 NOTE — ED Notes (Signed)
 Patient alert & oriented x4. Denies intent to harm self or others when asked. Denies A/VH. Patient reports chronic pain in lower back as well as a neck crick. PRN medication administered as well as scheduled medication. Medication effective in decreasing pain level. No acute distress noted. Patient assessed in milieu. No inappropriate behaviors observed or reported. Patient does report irritability regarding rules of facility as well as staffing complaints. Patient voiced opinion that coffee, especially decaf coffee be left out around the clock as it does not have enough caffeine present to cause any sleep disturbances or other issues. Patient's staff complaint was regarding the lack of a SW on weekends. Patient voiced that per them that he had to leave by Monday ad had nowhere to go. Patient continued calling [facility] from the contact list provided via the SW previously and reports having a place to go. No date given for acceptance from patient. Patient states they just said to call when I'm on my way. Patient also states he will need transportation as well as prescriptions prior to leaving. Writer informed patient that those are things we can definitely do for him. Patient voiced gratitude for this. Support and encouragement provided. Routine safety checks conducted per facility protocol. Encouraged patient to notify staff if any thoughts of harm towards self or others arise. Patient verbalizes understanding and agreement.
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
## 2024-08-04 NOTE — ED Notes (Signed)
 Patient resting with eyes closed in no apparent acute distress. Respirations even and unlabored. Environment secured. Safety checks in place according to facility policy.
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
## 2024-08-05 DIAGNOSIS — F1920 Other psychoactive substance dependence, uncomplicated: Secondary | ICD-10-CM

## 2024-08-05 NOTE — ED Notes (Signed)
 Pt observed lying in bed. Eyes closed respirations even and non labored. NAD q 15 minute observations continue for safety.

## 2024-08-05 NOTE — ED Notes (Signed)
 Pt presented at medication window.  Irritable affect.  Bothered that he did not see a Child psychotherapist all weekend.  He was given resources for [facility] at the end of last week and was able to obtain placement.   Low frustration tolerance noted.   Denied current SI plan and intent.  Denied HI and A/V hallucinations Q 15 minute observations for safety continue

## 2024-08-05 NOTE — Care Management (Signed)
 [facility] Care Management   Writer met with the patient and discussed his options.  Patient reports that he has secured a bed at the [facility] and [facility].  Patient declined CD-IOP services.  Patient reports that he wants to attend AA meeting with the [facility].    Patient reports that he is now ready to discharge.  Writer reminded patient that he has to wait until he speaks with the MD before he can discharge.

## 2024-08-05 NOTE — ED Notes (Signed)
 Pt is sleeping. No acute distress noted.

## 2024-08-05 NOTE — ED Notes (Signed)
 Patient is sleeping. Respirations equal and unlabored. No change in assessment or acuity. Routine safety checks conducted according to facility protocol.

## 2024-08-05 NOTE — ED Notes (Signed)
 Stable. A&O x 4.  Discharging to home/self care. Going to an [facility] [employer] provided with home medication and script.   Denies current SI plan and Intent.  Denies HI and A/V hallucinations.   All belongings returned to PT.  F/U instruction reviewed.  Pt verbalized understanding

## 2024-08-27 ENCOUNTER — Encounter (HOSPITAL_COMMUNITY): Payer: Self-pay

## 2024-08-27 ENCOUNTER — Emergency Department (HOSPITAL_COMMUNITY)
Admission: EM | Admit: 2024-08-27 | Discharge: 2024-08-27 | Disposition: A | Discharge status: Home / Self Care | Payer: MEDICAID | Attending: Emergency Medicine | Admitting: Emergency Medicine

## 2024-08-27 ENCOUNTER — Other Ambulatory Visit: Payer: Self-pay

## 2024-08-27 DIAGNOSIS — R42 Dizziness and giddiness: Secondary | ICD-10-CM

## 2024-08-27 DIAGNOSIS — E860 Dehydration: Secondary | ICD-10-CM | POA: Insufficient documentation

## 2024-08-27 DIAGNOSIS — Z7982 Long term (current) use of aspirin: Secondary | ICD-10-CM | POA: Diagnosis not present

## 2024-08-27 LAB — COMPREHENSIVE METABOLIC PANEL WITH GFR
ALT: 11 U/L (ref 0–44)
AST: 16 U/L (ref 15–41)
Albumin: 4.2 g/dL (ref 3.5–5.0)
Alkaline Phosphatase: 52 U/L (ref 38–126)
Anion gap: 14 (ref 5–15)
BUN: 9 mg/dL (ref 8–23)
CO2: 20 mmol/L — ABNORMAL LOW (ref 22–32)
Calcium: 9.4 mg/dL (ref 8.9–10.3)
Chloride: 104 mmol/L (ref 98–111)
Creatinine, Ser: 1 mg/dL (ref 0.61–1.24)
GFR, Estimated: >60 mL/min (ref >60)
Glucose, Bld: 131 mg/dL — ABNORMAL HIGH (ref 70–99)
Potassium: 4.1 mmol/L (ref 3.5–5.1)
Sodium: 138 mmol/L (ref 135–145)
Total Bilirubin: 0.3 mg/dL (ref 0.0–1.2)
Total Protein: 6.9 g/dL (ref 6.5–8.1)

## 2024-08-27 LAB — CBC
HCT: 49.3 % (ref 39.0–52.0)
Hemoglobin: 16.5 g/dL (ref 13.0–17.0)
MCH: 31.3 pg (ref 26.0–34.0)
MCHC: 33.5 g/dL (ref 30.0–36.0)
MCV: 93.4 fL (ref 80.0–100.0)
Platelets: 249 K/uL (ref 150–400)
RBC: 5.28 MIL/uL (ref 4.22–5.81)
RDW: 13.1 % (ref 11.5–15.5)
WBC: 8.5 K/uL (ref 4.0–10.5)
nRBC: 0 % (ref 0.0–0.2)

## 2024-08-27 LAB — URINALYSIS, ROUTINE W REFLEX MICROSCOPIC
Bacteria, UA: NONE SEEN
Bilirubin Urine: NEGATIVE
Glucose, UA: NEGATIVE mg/dL
Hgb urine dipstick: NEGATIVE
Ketones, ur: 5 mg/dL — AB
Nitrite: NEGATIVE
Protein, ur: NEGATIVE mg/dL
Specific Gravity, Urine: 1.02 (ref 1.005–1.030)
pH: 5 (ref 5.0–8.0)

## 2024-08-27 LAB — CBG MONITORING, ED: Glucose-Capillary: 112 mg/dL — ABNORMAL HIGH (ref 70–99)

## 2024-08-27 MED ORDER — SODIUM CHLORIDE 0.9 % IV BOLUS
1000.0000 mL | Freq: Once | INTRAVENOUS | Status: AC
  Administered 2024-08-27: 1000 mL via INTRAVENOUS

## 2024-08-27 NOTE — ED Triage Notes (Signed)
 Pt presents to ED from home C/O intermittent dizzy spells X 3 weeks, especially with exertion.

## 2024-08-27 NOTE — Discharge Instructions (Signed)
 As discussed, your evaluation today has been largely reassuring.  But, it is important that you monitor your condition carefully, and do not hesitate to return to the ED if you develop new, or concerning changes in your condition. ? ?Otherwise, please follow-up with your physician for appropriate ongoing care. ? ?

## 2024-08-27 NOTE — ED Provider Notes (Signed)
 [place] EMERGENCY DEPARTMENT AT [facility] Provider Note   CSN: [id number] Arrival date & time: 08/27/24  1100     Patient presents with: Dizziness   Darrell [name] is a 63 y.o. male.   HPI Patient presents with concern of episodic lightheadedness and dizziness.  He notes that when he stands up very quickly he feels lightheaded and dizzy, he has some plugging sensation with ambulation, but symptoms are mostly present with changing positioning. No chest pain, no focal weakness. Patient notes a history of hypertension, subs abuse, notes that he had a small relapse about 1 week ago, has not used meth or alcohol since that time.     Prior to Admission medications   Medication Sig Start Date End Date Taking? Authorizing Provider  amLODipine  (NORVASC ) 5 MG tablet Take 5 mg by mouth daily.    [provider]  aspirin  EC 81 MG tablet Take 81 mg by mouth daily. Swallow whole.    [provider]  atorvastatin  (LIPITOR ) 40 MG tablet Take 40 mg by mouth daily. 07/09/24   [provider]  gabapentin  (NEURONTIN ) 800 MG tablet Take 1 tablet (800 mg total) by mouth 3 (three) times daily. 08/04/24   Lawrnce Garvin PARAS, MD  lisinopril  (ZESTRIL ) 20 MG tablet Take 20 mg by mouth daily. 03/26/24   [provider]  Multiple Vitamin (MULTIVITAMIN WITH MINERALS) TABS tablet Take 1 tablet by mouth daily. 08/05/24   Lawrnce Garvin PARAS, MD  nicotine  (NICODERM CQ  - DOSED IN MG/24 HOURS) 21 mg/24hr patch Place 1 patch (21 mg total) onto the skin daily. 08/05/24   Lawrnce Garvin PARAS, MD    Allergies: Phenobarbital and Chlordiazepoxide  hcl    Review of Systems  Updated Vital Signs BP 118/86 (BP Location: Left Arm)   Pulse 75   Temp 97.8 F (36.6 C) (Oral)   Resp 11   Ht 1.803 m (5' 11)   Wt 93 kg   SpO2 100%   BMI 28.59 kg/m   Physical Exam Vitals and nursing note reviewed.  Constitutional:      General: He is not in acute distress.    Appearance: He  is well-developed.  HENT:     Head: Normocephalic and atraumatic.  Eyes:     Conjunctiva/sclera: Conjunctivae normal.  Cardiovascular:     Rate and Rhythm: Normal rate and regular rhythm.  Pulmonary:     Effort: Pulmonary effort is normal. No respiratory distress.     Breath sounds: No stridor.  Abdominal:     General: There is no distension.  Skin:    General: Skin is warm and dry.  Neurological:     General: No focal deficit present.     Mental Status: He is alert and oriented to person, place, and time.  Psychiatric:        Mood and Affect: Mood normal.        Behavior: Behavior normal.     (all labs ordered are listed, but only abnormal results are displayed) Labs Reviewed  COMPREHENSIVE METABOLIC PANEL WITH GFR - Abnormal; Notable for the following components:      Result Value   CO2 20 (*)    Glucose, Bld 131 (*)    All other components within normal limits  URINALYSIS, ROUTINE W REFLEX MICROSCOPIC - Abnormal; Notable for the following components:   Color, Urine AMBER (*)    APPearance HAZY (*)    Ketones, ur 5 (*)    Leukocytes,Ua TRACE (*)  All other components within normal limits  CBG MONITORING, ED - Abnormal; Notable for the following components:   Glucose-Capillary 112 (*)    All other components within normal limits  CBC    EKG: EKG Interpretation Date/Time:  Tuesday August 27 2024 11:10:20 EDT Ventricular Rate:  97 PR Interval:  122 QRS Duration:  106 QT Interval:  376 QTC Calculation: 478 R Axis:   6  Text Interpretation: Sinus rhythm Ventricular premature complex Inferior infarct, old Consider anterior infarct Confirmed by Garrick Charleston [phone]) on 08/27/2024 1:06:47 PM  Radiology: No results found.   Procedures   Medications Ordered in the ED  sodium chloride  0.9 % bolus 1,000 mL (1,000 mLs Intravenous New Bag/Given 08/27/24 1234)                                    Medical Decision Making Adult male presents with dizziness,  lightheadedness. Patient is awake, alert, hemodynamically unremarkable.  Broad differential including dehydration, lecture abnormalities, arrhythmia.  Given patient's description of using multiple substances, some consideration of withdrawal, though the timeframe does not fit this. Patient had labs monitoring x-ray.  Cardiac 75 sinus normal Pulse ox 100% room air normal   Amount and/or Complexity of Data Reviewed External Data Reviewed: notes. Labs: ordered. Decision-making details documented in ED Course. ECG/medicine tests: ordered and independent interpretation performed. Decision-making details documented in ED Course.  Risk Prescription drug management. Decision regarding hospitalization. Diagnosis or treatment significantly limited by social determinants of health.   3:24 PM Patient awake, alert, no distress, labs reviewed, mild ketonuria consistent with consideration of dehydration.  No arrhythmia on hours of monitoring here, no additional symptoms, no chest pain, no ischemia on EKG, discussed further fluid provision here versus fluid resuscitation at home, outpatient follow-up with the patient is comfortable with this latter course. Patient discharged in stable condition.     Final diagnoses:  Dizziness  Dehydration    ED Discharge Orders     None          Garrick Charleston, MD 08/27/24 1524

## 2024-10-09 NOTE — Telephone Encounter (Signed)
 There was no contact. This is not an encounter.

## 2024-11-09 ENCOUNTER — Encounter (HOSPITAL_COMMUNITY): Payer: Self-pay | Admitting: Emergency Medicine

## 2024-11-09 ENCOUNTER — Other Ambulatory Visit: Payer: Self-pay

## 2024-11-09 ENCOUNTER — Emergency Department (HOSPITAL_COMMUNITY)
Admission: EM | Admit: 2024-11-09 | Discharge: 2024-11-09 | Disposition: A | Discharge status: Home / Self Care | Payer: MEDICAID | Source: Home / Self Care | Attending: Emergency Medicine | Admitting: Emergency Medicine

## 2024-11-09 DIAGNOSIS — F1010 Alcohol abuse, uncomplicated: Secondary | ICD-10-CM

## 2024-11-09 DIAGNOSIS — I2510 Atherosclerotic heart disease of native coronary artery without angina pectoris: Secondary | ICD-10-CM | POA: Insufficient documentation

## 2024-11-09 DIAGNOSIS — I129 Hypertensive chronic kidney disease with stage 1 through stage 4 chronic kidney disease, or unspecified chronic kidney disease: Secondary | ICD-10-CM | POA: Insufficient documentation

## 2024-11-09 DIAGNOSIS — Z79899 Other long term (current) drug therapy: Secondary | ICD-10-CM | POA: Insufficient documentation

## 2024-11-09 DIAGNOSIS — F1590 Other stimulant use, unspecified, uncomplicated: Secondary | ICD-10-CM | POA: Insufficient documentation

## 2024-11-09 DIAGNOSIS — N189 Chronic kidney disease, unspecified: Secondary | ICD-10-CM | POA: Insufficient documentation

## 2024-11-09 DIAGNOSIS — F1290 Cannabis use, unspecified, uncomplicated: Secondary | ICD-10-CM | POA: Insufficient documentation

## 2024-11-09 DIAGNOSIS — F10120 Alcohol abuse with intoxication, uncomplicated: Secondary | ICD-10-CM | POA: Insufficient documentation

## 2024-11-09 DIAGNOSIS — Z7982 Long term (current) use of aspirin: Secondary | ICD-10-CM | POA: Insufficient documentation

## 2024-11-09 NOTE — ED Notes (Signed)
 Patient given [place] sandwich and [place] juice

## 2024-11-09 NOTE — ED Triage Notes (Signed)
 Pt via [facility] from [facility] c/o alcohol and drug use, requesting detox. Admits ETOH and meth use today. Pt requests resources for substance abuse. Cooperative, a/o per EMS.   BP 150/80 HR 110 RR 22 O2 93% RA CBG 128

## 2024-11-09 NOTE — Discharge Instructions (Signed)
 Can follow-up with one of the local facilities for detox/rehab. See attached. Return here for new concerns.

## 2024-11-09 NOTE — ED Provider Notes (Signed)
 [place] EMERGENCY DEPARTMENT AT [facility] Provider Note   CSN: [id number] Arrival date & time: 11/09/24  9951     Patient presents with: Alcohol Problem   Darrell [name] is a 63 y.o. male.   The history is provided by the patient and medical records.  Alcohol Problem   63 year old male with history of alcohol abuse, coronary artery disease, substance abuse, chronic kidney disease, hypertension, presenting to the ED acutely intoxicated.  Patient was picked up by EMS outside of a local [employer], heavily intoxicated.  Reported to EMS he needed detox.  Admits to longstanding alcohol and drug problems, notably meth and marijuana.  Cannot quantify his drinks per day but states its a lot.  Did rehab about 6 months ago, cannot recall facility he went to.  Denies SI/HI/AVH.  Prior to Admission medications   Medication Sig Start Date End Date Taking? Authorizing Provider  amLODipine  (NORVASC ) 5 MG tablet Take 5 mg by mouth daily.    [provider]  aspirin  EC 81 MG tablet Take 81 mg by mouth daily. Swallow whole.    [provider]  atorvastatin  (LIPITOR ) 40 MG tablet Take 40 mg by mouth daily. 07/09/24   [provider]  gabapentin  (NEURONTIN ) 800 MG tablet Take 1 tablet (800 mg total) by mouth 3 (three) times daily. 08/04/24   Lawrnce Garvin PARAS, MD  lisinopril  (ZESTRIL ) 20 MG tablet Take 20 mg by mouth daily. 03/26/24   [provider]  Multiple Vitamin (MULTIVITAMIN WITH MINERALS) TABS tablet Take 1 tablet by mouth daily. 08/05/24   Lawrnce Garvin PARAS, MD  nicotine  (NICODERM CQ  - DOSED IN MG/24 HOURS) 21 mg/24hr patch Place 1 patch (21 mg total) onto the skin daily. 08/05/24   Lawrnce Garvin PARAS, MD    Allergies: Phenobarbital and Chlordiazepoxide  hcl    Review of Systems  Psychiatric/Behavioral:         EtOH  All other systems reviewed and are negative.   Updated Vital Signs BP (!) 140/98 (BP Location: Right Arm)    Pulse 94   Temp 98.4 F (36.9 C) (Oral)   Resp 18   Ht 5' 11 (1.803 m)   Wt 93 kg   SpO2 93%   BMI 28.59 kg/m   Physical Exam Vitals and nursing note reviewed.  Constitutional:      Appearance: He is well-developed.     Comments: Intoxicated, slurring words a bit  HENT:     Head: Normocephalic and atraumatic.  Eyes:     Conjunctiva/sclera: Conjunctivae normal.     Pupils: Pupils are equal, round, and reactive to light.  Cardiovascular:     Rate and Rhythm: Normal rate and regular rhythm.     Heart sounds: Normal heart sounds.  Pulmonary:     Effort: Pulmonary effort is normal.     Breath sounds: Normal breath sounds.  Abdominal:     General: Bowel sounds are normal.     Palpations: Abdomen is soft.  Musculoskeletal:        General: Normal range of motion.     Cervical back: Normal range of motion.  Skin:    General: Skin is warm and dry.  Neurological:     Comments: Slurring words a bit but answering questions and following commands appropriately, no focal deficits     (all labs ordered are listed, but only abnormal results are displayed) Labs Reviewed - No data to display  EKG: None  Radiology: No results found.  Procedures   Medications Ordered in the ED - No data to display                                  Medical Decision Making  63 year old male presenting to the ED for ongoing alcohol abuse.  Told EMS he wanted detox.  Admits to heavy alcohol use as well as meth and marijuana use.  He appears intoxicated here, slurring words a bit but still able to answer questions and follow commands.  Denies SI/HI/AVH.  Will allow to metabolize here, anticipate discharge with OP resources.  6:21 AM Patient has been allowed to rest and metabolize overnight.  He has been able to eat/drink here without issue.  Appears stable for discharge.  Can set up OP detox as desired-- given list of resources.  Can return here for new concerns.  Final diagnoses:  Alcohol  abuse    ED Discharge Orders     None          Jarold Olam HERO, PA-C 11/09/24 0622    Trine Raynell Moder, MD 11/09/24 [phone]

## 2024-11-10 ENCOUNTER — Encounter (HOSPITAL_COMMUNITY): Payer: Self-pay | Admitting: Emergency Medicine

## 2024-11-10 ENCOUNTER — Other Ambulatory Visit: Payer: Self-pay

## 2024-11-10 ENCOUNTER — Inpatient Hospital Stay (HOSPITAL_COMMUNITY)
Admission: EM | Admit: 2024-11-10 | Discharge: 2024-11-13 | DRG: 894 | Discharge status: Against Medical Advice | Payer: MEDICAID | Attending: Internal Medicine | Admitting: Internal Medicine

## 2024-11-10 ENCOUNTER — Emergency Department (HOSPITAL_COMMUNITY): Payer: MEDICAID

## 2024-11-10 DIAGNOSIS — F319 Bipolar disorder, unspecified: Secondary | ICD-10-CM | POA: Diagnosis present

## 2024-11-10 DIAGNOSIS — F1210 Cannabis abuse, uncomplicated: Secondary | ICD-10-CM | POA: Diagnosis present

## 2024-11-10 DIAGNOSIS — R000 Tachycardia, unspecified: Secondary | ICD-10-CM | POA: Diagnosis present

## 2024-11-10 DIAGNOSIS — F10231 Alcohol dependence with withdrawal delirium: Principal | ICD-10-CM | POA: Diagnosis present

## 2024-11-10 DIAGNOSIS — Z5982 Transportation insecurity: Secondary | ICD-10-CM

## 2024-11-10 DIAGNOSIS — Y908 Blood alcohol level of 240 mg/100 ml or more: Secondary | ICD-10-CM | POA: Diagnosis present

## 2024-11-10 DIAGNOSIS — F10221 Alcohol dependence with intoxication delirium: Secondary | ICD-10-CM | POA: Diagnosis present

## 2024-11-10 DIAGNOSIS — I10 Essential (primary) hypertension: Secondary | ICD-10-CM | POA: Diagnosis present

## 2024-11-10 DIAGNOSIS — F1090 Alcohol use, unspecified, uncomplicated: Secondary | ICD-10-CM

## 2024-11-10 DIAGNOSIS — I2489 Other forms of acute ischemic heart disease: Secondary | ICD-10-CM | POA: Diagnosis present

## 2024-11-10 DIAGNOSIS — F1910 Other psychoactive substance abuse, uncomplicated: Secondary | ICD-10-CM | POA: Diagnosis present

## 2024-11-10 DIAGNOSIS — Z6828 Body mass index (BMI) 28.0-28.9, adult: Secondary | ICD-10-CM

## 2024-11-10 DIAGNOSIS — F1410 Cocaine abuse, uncomplicated: Secondary | ICD-10-CM | POA: Diagnosis present

## 2024-11-10 DIAGNOSIS — F17200 Nicotine dependence, unspecified, uncomplicated: Secondary | ICD-10-CM | POA: Diagnosis present

## 2024-11-10 DIAGNOSIS — Z560 Unemployment, unspecified: Secondary | ICD-10-CM

## 2024-11-10 DIAGNOSIS — Z85038 Personal history of other malignant neoplasm of large intestine: Secondary | ICD-10-CM

## 2024-11-10 DIAGNOSIS — E669 Obesity, unspecified: Secondary | ICD-10-CM | POA: Diagnosis present

## 2024-11-10 DIAGNOSIS — F32A Depression, unspecified: Secondary | ICD-10-CM | POA: Diagnosis present

## 2024-11-10 DIAGNOSIS — Z5900 Homelessness unspecified: Secondary | ICD-10-CM

## 2024-11-10 DIAGNOSIS — F1020 Alcohol dependence, uncomplicated: Secondary | ICD-10-CM | POA: Diagnosis present

## 2024-11-10 DIAGNOSIS — Z91199 Patient's noncompliance with other medical treatment and regimen due to unspecified reason: Secondary | ICD-10-CM

## 2024-11-10 DIAGNOSIS — R079 Chest pain, unspecified: Principal | ICD-10-CM | POA: Diagnosis present

## 2024-11-10 DIAGNOSIS — M549 Dorsalgia, unspecified: Secondary | ICD-10-CM | POA: Diagnosis present

## 2024-11-10 DIAGNOSIS — R45850 Homicidal ideations: Secondary | ICD-10-CM | POA: Diagnosis present

## 2024-11-10 DIAGNOSIS — F1510 Other stimulant abuse, uncomplicated: Secondary | ICD-10-CM | POA: Diagnosis present

## 2024-11-10 DIAGNOSIS — Z888 Allergy status to other drugs, medicaments and biological substances status: Secondary | ICD-10-CM

## 2024-11-10 DIAGNOSIS — Z59868 Other specified financial insecurity: Secondary | ICD-10-CM

## 2024-11-10 DIAGNOSIS — F331 Major depressive disorder, recurrent, moderate: Secondary | ICD-10-CM | POA: Diagnosis present

## 2024-11-10 DIAGNOSIS — D751 Secondary polycythemia: Secondary | ICD-10-CM | POA: Diagnosis present

## 2024-11-10 DIAGNOSIS — Z5902 Unsheltered homelessness: Secondary | ICD-10-CM

## 2024-11-10 DIAGNOSIS — R45851 Suicidal ideations: Secondary | ICD-10-CM | POA: Diagnosis present

## 2024-11-10 DIAGNOSIS — E785 Hyperlipidemia, unspecified: Secondary | ICD-10-CM | POA: Diagnosis present

## 2024-11-10 DIAGNOSIS — K219 Gastro-esophageal reflux disease without esophagitis: Secondary | ICD-10-CM | POA: Diagnosis present

## 2024-11-10 DIAGNOSIS — G621 Alcoholic polyneuropathy: Secondary | ICD-10-CM | POA: Diagnosis present

## 2024-11-10 DIAGNOSIS — G8929 Other chronic pain: Secondary | ICD-10-CM | POA: Diagnosis present

## 2024-11-10 DIAGNOSIS — Z955 Presence of coronary angioplasty implant and graft: Secondary | ICD-10-CM

## 2024-11-10 DIAGNOSIS — F6089 Other specific personality disorders: Secondary | ICD-10-CM | POA: Diagnosis present

## 2024-11-10 DIAGNOSIS — Z8619 Personal history of other infectious and parasitic diseases: Secondary | ICD-10-CM

## 2024-11-10 DIAGNOSIS — Z5329 Procedure and treatment not carried out because of patient's decision for other reasons: Secondary | ICD-10-CM | POA: Diagnosis present

## 2024-11-10 DIAGNOSIS — F419 Anxiety disorder, unspecified: Secondary | ICD-10-CM | POA: Diagnosis present

## 2024-11-10 DIAGNOSIS — I252 Old myocardial infarction: Secondary | ICD-10-CM

## 2024-11-10 DIAGNOSIS — F17210 Nicotine dependence, cigarettes, uncomplicated: Secondary | ICD-10-CM | POA: Diagnosis present

## 2024-11-10 LAB — RAPID URINE DRUG SCREEN, HOSP PERFORMED
Amphetamines: POSITIVE — AB
Barbiturates: NOT DETECTED
Benzodiazepines: NOT DETECTED
Cocaine: NOT DETECTED
Opiates: NOT DETECTED
Tetrahydrocannabinol: POSITIVE — AB

## 2024-11-10 LAB — ETHANOL: Alcohol, Ethyl (B): 272 mg/dL — ABNORMAL HIGH (ref <15)

## 2024-11-10 NOTE — ED Triage Notes (Addendum)
 Pt arrives w/ [facility]/ c/o on and off chest pain x 1 year. Pt ran out of gabapetin & reports pain. Reports SI & HI, homeless & drug use. Used meth yesterday. 324 ASA given en route. Hx MI

## 2024-11-11 DIAGNOSIS — Z560 Unemployment, unspecified: Secondary | ICD-10-CM | POA: Diagnosis not present

## 2024-11-11 DIAGNOSIS — F319 Bipolar disorder, unspecified: Secondary | ICD-10-CM | POA: Diagnosis present

## 2024-11-11 DIAGNOSIS — F10930 Alcohol use, unspecified with withdrawal, uncomplicated: Secondary | ICD-10-CM | POA: Diagnosis not present

## 2024-11-11 DIAGNOSIS — E785 Hyperlipidemia, unspecified: Secondary | ICD-10-CM

## 2024-11-11 DIAGNOSIS — R079 Chest pain, unspecified: Secondary | ICD-10-CM

## 2024-11-11 DIAGNOSIS — Z5329 Procedure and treatment not carried out because of patient's decision for other reasons: Secondary | ICD-10-CM | POA: Diagnosis present

## 2024-11-11 DIAGNOSIS — F1510 Other stimulant abuse, uncomplicated: Secondary | ICD-10-CM

## 2024-11-11 DIAGNOSIS — I252 Old myocardial infarction: Secondary | ICD-10-CM | POA: Diagnosis not present

## 2024-11-11 DIAGNOSIS — F1410 Cocaine abuse, uncomplicated: Secondary | ICD-10-CM | POA: Diagnosis present

## 2024-11-11 DIAGNOSIS — Z5902 Unsheltered homelessness: Secondary | ICD-10-CM | POA: Diagnosis not present

## 2024-11-11 DIAGNOSIS — I129 Hypertensive chronic kidney disease with stage 1 through stage 4 chronic kidney disease, or unspecified chronic kidney disease: Secondary | ICD-10-CM | POA: Diagnosis present

## 2024-11-11 DIAGNOSIS — Y908 Blood alcohol level of 240 mg/100 ml or more: Secondary | ICD-10-CM | POA: Diagnosis present

## 2024-11-11 DIAGNOSIS — R45851 Suicidal ideations: Secondary | ICD-10-CM | POA: Diagnosis present

## 2024-11-11 DIAGNOSIS — F6089 Other specific personality disorders: Secondary | ICD-10-CM | POA: Diagnosis present

## 2024-11-11 DIAGNOSIS — G621 Alcoholic polyneuropathy: Secondary | ICD-10-CM | POA: Diagnosis present

## 2024-11-11 DIAGNOSIS — Z955 Presence of coronary angioplasty implant and graft: Secondary | ICD-10-CM | POA: Diagnosis not present

## 2024-11-11 DIAGNOSIS — F10221 Alcohol dependence with intoxication delirium: Secondary | ICD-10-CM | POA: Diagnosis present

## 2024-11-11 DIAGNOSIS — Z79899 Other long term (current) drug therapy: Secondary | ICD-10-CM | POA: Diagnosis not present

## 2024-11-11 DIAGNOSIS — I10 Essential (primary) hypertension: Secondary | ICD-10-CM

## 2024-11-11 DIAGNOSIS — F17210 Nicotine dependence, cigarettes, uncomplicated: Secondary | ICD-10-CM

## 2024-11-11 DIAGNOSIS — Z7982 Long term (current) use of aspirin: Secondary | ICD-10-CM | POA: Diagnosis not present

## 2024-11-11 DIAGNOSIS — Z85038 Personal history of other malignant neoplasm of large intestine: Secondary | ICD-10-CM | POA: Diagnosis not present

## 2024-11-11 DIAGNOSIS — E669 Obesity, unspecified: Secondary | ICD-10-CM | POA: Diagnosis present

## 2024-11-11 DIAGNOSIS — F10231 Alcohol dependence with withdrawal delirium: Secondary | ICD-10-CM | POA: Diagnosis present

## 2024-11-11 DIAGNOSIS — D751 Secondary polycythemia: Secondary | ICD-10-CM | POA: Diagnosis present

## 2024-11-11 DIAGNOSIS — F1010 Alcohol abuse, uncomplicated: Secondary | ICD-10-CM | POA: Diagnosis not present

## 2024-11-11 DIAGNOSIS — N189 Chronic kidney disease, unspecified: Secondary | ICD-10-CM | POA: Diagnosis present

## 2024-11-11 DIAGNOSIS — I2489 Other forms of acute ischemic heart disease: Secondary | ICD-10-CM | POA: Diagnosis present

## 2024-11-11 DIAGNOSIS — I2510 Atherosclerotic heart disease of native coronary artery without angina pectoris: Secondary | ICD-10-CM | POA: Diagnosis present

## 2024-11-11 LAB — HEPATIC FUNCTION PANEL
ALT: 28 U/L (ref 0–44)
AST: 38 U/L (ref 15–41)
Albumin: 3.2 g/dL — ABNORMAL LOW (ref 3.5–5.0)
Alkaline Phosphatase: 75 U/L (ref 38–126)
Bilirubin, Direct: 0.5 mg/dL — ABNORMAL HIGH (ref 0.0–0.2)
Indirect Bilirubin: 1.1 mg/dL — ABNORMAL HIGH (ref 0.3–0.9)
Total Bilirubin: 1.6 mg/dL — ABNORMAL HIGH (ref 0.0–1.2)
Total Protein: 6.2 g/dL — ABNORMAL LOW (ref 6.5–8.1)

## 2024-11-11 LAB — CBC
HCT: 59.3 % — ABNORMAL HIGH (ref 39.0–52.0)
Hemoglobin: 20.6 g/dL — ABNORMAL HIGH (ref 13.0–17.0)
MCH: 31.9 pg (ref 26.0–34.0)
MCHC: 34.7 g/dL (ref 30.0–36.0)
MCV: 91.9 fL (ref 80.0–100.0)
Platelets: 242 K/uL (ref 150–400)
RBC: 6.45 MIL/uL — ABNORMAL HIGH (ref 4.22–5.81)
RDW: 14.6 % (ref 11.5–15.5)
WBC: 7.6 K/uL (ref 4.0–10.5)
nRBC: 0 % (ref 0.0–0.2)

## 2024-11-11 LAB — BASIC METABOLIC PANEL WITH GFR
Anion gap: 16 — ABNORMAL HIGH (ref 5–15)
BUN: <5 mg/dL — ABNORMAL LOW (ref 8–23)
CO2: 22 mmol/L (ref 22–32)
Calcium: 8.7 mg/dL — ABNORMAL LOW (ref 8.9–10.3)
Chloride: 95 mmol/L — ABNORMAL LOW (ref 98–111)
Creatinine, Ser: 0.91 mg/dL (ref 0.61–1.24)
GFR, Estimated: >60 mL/min (ref >60)
Glucose, Bld: 116 mg/dL — ABNORMAL HIGH (ref 70–99)
Potassium: 3.7 mmol/L (ref 3.5–5.1)
Sodium: 133 mmol/L — ABNORMAL LOW (ref 135–145)

## 2024-11-11 LAB — FOLATE: Folate: 5.4 ng/mL — ABNORMAL LOW (ref >5.9)

## 2024-11-11 LAB — TROPONIN I (HIGH SENSITIVITY)
Troponin I (High Sensitivity): 46 ng/L — ABNORMAL HIGH (ref <18)
Troponin I (High Sensitivity): 53 ng/L — ABNORMAL HIGH (ref <18)

## 2024-11-11 LAB — VITAMIN B12: Vitamin B-12: 354 pg/mL (ref 180–914)

## 2024-11-11 MED ORDER — CHLORDIAZEPOXIDE HCL 25 MG PO CAPS
25.0000 mg | ORAL_CAPSULE | Freq: Three times a day (TID) | ORAL | Status: AC
  Administered 2024-11-12 (×3): 25 mg via ORAL
  Filled 2024-11-11 (×3): qty 1

## 2024-11-11 MED ORDER — GABAPENTIN 800 MG PO TABS: 800.0000 mg | ORAL_TABLET | Freq: Three times a day (TID) | ORAL | Status: DC

## 2024-11-11 MED ORDER — THIAMINE HCL 100 MG/ML IJ SOLN: 100.0000 mg | Freq: Once | INTRAMUSCULAR | Status: DC

## 2024-11-11 MED ORDER — CHLORDIAZEPOXIDE HCL 25 MG PO CAPS: 25.0000 mg | ORAL_CAPSULE | Freq: Three times a day (TID) | ORAL | Status: DC

## 2024-11-11 MED ORDER — ONDANSETRON 4 MG PO TBDP: 4.0000 mg | ORAL_TABLET | Freq: Four times a day (QID) | ORAL | Status: DC | PRN

## 2024-11-11 MED ORDER — CHLORDIAZEPOXIDE HCL 25 MG PO CAPS: 25.0000 mg | ORAL_CAPSULE | Freq: Four times a day (QID) | ORAL | Status: DC

## 2024-11-11 MED ORDER — LOPERAMIDE HCL 2 MG PO CAPS: 2.0000 mg | ORAL_CAPSULE | ORAL | Status: DC | PRN

## 2024-11-11 MED ORDER — NICOTINE 21 MG/24HR TD PT24
21.0000 mg | MEDICATED_PATCH | Freq: Every day | TRANSDERMAL | Status: DC
  Administered 2024-11-11 – 2024-11-12 (×2): 21 mg via TRANSDERMAL
  Filled 2024-11-11 (×2): qty 1

## 2024-11-11 MED ORDER — THIAMINE HCL 100 MG/ML IJ SOLN: 100.0000 mg | Freq: Every day | INTRAMUSCULAR | Status: DC

## 2024-11-11 MED ORDER — CHLORDIAZEPOXIDE HCL 25 MG PO CAPS: 25.0000 mg | ORAL_CAPSULE | Freq: Every day | ORAL | Status: DC

## 2024-11-11 MED ORDER — ADULT MULTIVITAMIN W/MINERALS CH
1.0000 | ORAL_TABLET | Freq: Every day | ORAL | Status: DC
  Filled 2024-11-11: qty 1

## 2024-11-11 MED ORDER — SODIUM CHLORIDE 0.9 % IV BOLUS
1000.0000 mL | Freq: Once | INTRAVENOUS | Status: AC
  Administered 2024-11-11: 1000 mL via INTRAVENOUS

## 2024-11-11 MED ORDER — ADULT MULTIVITAMIN W/MINERALS CH
1.0000 | ORAL_TABLET | Freq: Every day | ORAL | Status: DC
  Administered 2024-11-11 – 2024-11-12 (×2): 1 via ORAL
  Filled 2024-11-11 (×2): qty 1

## 2024-11-11 MED ORDER — HYDROXYZINE HCL 25 MG PO TABS
25.0000 mg | ORAL_TABLET | Freq: Four times a day (QID) | ORAL | Status: DC | PRN
  Administered 2024-11-11 – 2024-11-12 (×2): 25 mg via ORAL
  Filled 2024-11-11 (×2): qty 1

## 2024-11-11 MED ORDER — ADULT MULTIVITAMIN W/MINERALS CH: 1.0000 | ORAL_TABLET | Freq: Every day | ORAL | Status: DC

## 2024-11-11 MED ORDER — LORAZEPAM 2 MG/ML IJ SOLN
1.0000 mg | INTRAMUSCULAR | Status: DC | PRN
  Filled 2024-11-11: qty 1

## 2024-11-11 MED ORDER — LISINOPRIL 20 MG PO TABS
20.0000 mg | ORAL_TABLET | Freq: Every day | ORAL | Status: DC
Start: 2024-11-11 — End: 2024-11-13
  Administered 2024-11-11 – 2024-11-12 (×2): 20 mg via ORAL
  Filled 2024-11-11 (×2): qty 1

## 2024-11-11 MED ORDER — ACETAMINOPHEN 325 MG PO TABS
650.0000 mg | ORAL_TABLET | Freq: Four times a day (QID) | ORAL | Status: DC | PRN
Start: 2024-11-11 — End: 2024-11-13

## 2024-11-11 MED ORDER — AMLODIPINE BESYLATE 5 MG PO TABS
5.0000 mg | ORAL_TABLET | Freq: Every day | ORAL | Status: DC
  Administered 2024-11-11 – 2024-11-12 (×2): 5 mg via ORAL
  Filled 2024-11-11 (×2): qty 1

## 2024-11-11 MED ORDER — THIAMINE MONONITRATE 100 MG PO TABS
100.0000 mg | ORAL_TABLET | Freq: Every day | ORAL | Status: DC
  Administered 2024-11-12: 100 mg via ORAL
  Filled 2024-11-11: qty 1

## 2024-11-11 MED ORDER — THIAMINE MONONITRATE 100 MG PO TABS
100.0000 mg | ORAL_TABLET | Freq: Every day | ORAL | Status: DC
  Administered 2024-11-11: 100 mg via ORAL
  Filled 2024-11-11: qty 1

## 2024-11-11 MED ORDER — CHLORDIAZEPOXIDE HCL 25 MG PO CAPS: 25.0000 mg | ORAL_CAPSULE | ORAL | Status: DC

## 2024-11-11 MED ORDER — FOLIC ACID 1 MG PO TABS
1.0000 mg | ORAL_TABLET | Freq: Every day | ORAL | Status: DC
  Administered 2024-11-11 – 2024-11-12 (×2): 1 mg via ORAL
  Filled 2024-11-11 (×2): qty 1

## 2024-11-11 MED ORDER — HYDROXYZINE HCL 25 MG PO TABS: 25.0000 mg | ORAL_TABLET | Freq: Four times a day (QID) | ORAL | Status: DC | PRN

## 2024-11-11 MED ORDER — CHLORDIAZEPOXIDE HCL 25 MG PO CAPS: 25.0000 mg | ORAL_CAPSULE | Freq: Four times a day (QID) | ORAL | Status: DC | PRN

## 2024-11-11 MED ORDER — ENOXAPARIN SODIUM 40 MG/0.4ML IJ SOSY
40.0000 mg | PREFILLED_SYRINGE | INTRAMUSCULAR | Status: DC
  Administered 2024-11-11 – 2024-11-12 (×2): 40 mg via SUBCUTANEOUS
  Filled 2024-11-11 (×2): qty 0.4

## 2024-11-11 MED ORDER — ATORVASTATIN CALCIUM 40 MG PO TABS
40.0000 mg | ORAL_TABLET | Freq: Every day | ORAL | Status: DC
  Administered 2024-11-11 – 2024-11-12 (×2): 40 mg via ORAL
  Filled 2024-11-11 (×2): qty 1

## 2024-11-11 MED ORDER — GABAPENTIN 300 MG PO CAPS
800.0000 mg | ORAL_CAPSULE | Freq: Three times a day (TID) | ORAL | Status: DC
  Administered 2024-11-11: 800 mg via ORAL
  Filled 2024-11-11: qty 2

## 2024-11-11 MED ORDER — CHLORDIAZEPOXIDE HCL 25 MG PO CAPS
25.0000 mg | ORAL_CAPSULE | Freq: Four times a day (QID) | ORAL | Status: AC
Start: 2024-11-11 — End: 2024-11-12
  Administered 2024-11-11 (×3): 25 mg via ORAL
  Filled 2024-11-11 (×4): qty 1

## 2024-11-11 MED ORDER — LORAZEPAM 1 MG PO TABS
1.0000 mg | ORAL_TABLET | ORAL | Status: DC | PRN
  Administered 2024-11-11 – 2024-11-13 (×7): 1 mg via ORAL
  Filled 2024-11-11 (×7): qty 1

## 2024-11-11 MED ORDER — ASPIRIN 81 MG PO TBEC
81.0000 mg | DELAYED_RELEASE_TABLET | Freq: Every day | ORAL | Status: DC
  Administered 2024-11-11 – 2024-11-12 (×2): 81 mg via ORAL
  Filled 2024-11-11 (×2): qty 1

## 2024-11-11 NOTE — ED Notes (Signed)
 Unable to get lab x1. Patient refused to be stuck again. RN notified. KIT

## 2024-11-11 NOTE — ED Notes (Signed)
 Patient sitting up in bed, eating and drinking with no difficulty. Patient breathing regular, and unlabored. Patient appears to be in no acute distress.

## 2024-11-11 NOTE — ED Notes (Signed)
 Pt wanded by security.

## 2024-11-11 NOTE — Consult Note (Addendum)
 [facility] Psychiatric Consult Initial  Patient Name: .Darrell [name]  MRN: [id number]  DOB: May 17, 1961  Consult Order details:  Orders (From admission, onward)     Start     Ordered   07/30/24 2335  CONSULT TO CALL ACT TEAM       Ordering Provider: Albertina Dixon, MD  Provider:  (Not yet assigned)  Question:  Reason for Consult?  Answer:  SI   07/30/24 2334             Mode of Visit: In person    Psychiatry Consult Evaluation  Service Date: November 11, 2024 LOS:  LOS: 0 days  Chief Complaint I need detox  Primary Psychiatric Diagnoses  Alcohol abuse   Assessment  [name] is a 63 y.o. male admitted: Presented to the EDfor 11/10/2024 10:55 PM for brought in by EMS from [facility] requesting alcohol detox. He carries the psychiatric diagnoses of MDD, alcohol abuse, bipolar 1 disorder, auditory hallucinations, polysubstance abuse, Cluster B personality traits and malingering and has a past medical history of  CKD, GERD, HTN, HepC, CAD, chronic back pain and obesity.   His current presentation of requesting alcohol detox is most consistent with alcohol abuse. He meets criteria for inpatient detox based on current daily alcohol use.  Current outpatient psychotropic medications include none and historically he has had a non response to these medications. He was compliant with medications prior to admission as evidenced by patient states he is not taking anything. On initial examination, patient is cooperative and asking for detox. Please see plan below for detailed recommendations.   Diagnoses:  Active Hospital problems: Principal Problem:   Chest pain with high risk for cardiac etiology    Plan   ## Psychiatric Medication Recommendations:  --CIWA protocol -Librium  Detox --Nicotine  Protocol   ## Medical Decision Making Capacity: Not specifically addressed in this encounter  ## Further Work-up:  -- most recent EKG on 11/11/2024 had QtC of 446;  --  Pertinent labwork reviewed earlier this admission includes: CBC, CMP, alcohol, UA and UDS (+ amphetamines and THC).    ## Disposition:-- We recommend inpatient detoxification/rehabilitation. Not inpatient psychiatric admission. In the event patient attempts to leave prior to rehab being placed, he does not at the time of my evaluation meet inpatient psychiatric hospitalization criteria.   ## Behavioral / Environmental: - No specific recommendations at this time.  DC 1:1   ## Safety and Observation Level:  - Based on my clinical evaluation, I estimate the patient to be at low risk of self harm in the current setting. - At this time, we recommend  routine. This decision is based on my review of the chart including patient's history and current presentation, interview of the patient, mental status examination, and consideration of suicide risk including evaluating suicidal ideation, plan, intent, suicidal or self-harm behaviors, risk factors, and protective factors. This judgment is based on our ability to directly address suicide risk, implement suicide prevention strategies, and develop a safety plan while the patient is in the clinical setting. Please contact our team if there is a concern that risk level has changed.  CSSR Risk Category:Low  Suicide Risk Assessment: Patient has following modifiable risk factors for suicide: , which we are addressing by recommending inpatient detox and outpatient follow up. Patient has following non-modifiable or demographic risk factors for suicide: male gender and psychiatric hospitalization Patient has the following protective factors against suicide: Supportive family  Thank you for this consult request. Recommendations  have been communicated to the primary team.  We will continue to follow at this time.   Majel GORMAN Ramp, FNP       History of Present Illness  Relevant Aspects of Hospital ED Course:  [name] is a 63 year old male  admitted on 11/10/2024 after being brought in by EMS from home requesting alcohol detox. He carries psychiatric diagnoses of major depressive disorder, alcohol use disorder, bipolar I disorder, polysubstance abuse, auditory hallucinations, Cluster B personality traits, and malingering. His medical history includes CKD, GERD, hypertension, Hepatitis C, coronary artery disease with prior STEMI and stenting in 2014, chronic back pain, and obesity.  He also presented with chest pain, though at the time of evaluation he declined to engage in discussion and reported that the pain had resolved, stating he "just wants to rest." Additional information was obtained via chart review, EMS, and the emergency department team.  Mr. Jumper has had 10 ED visits in the past 6 months for issues related to alcohol intoxication and polysubstance use. He was previously admitted to [facility] in November 2024 and [facility] in 07/2024 requesting detox, at which time he reported drinking approximately one pint of vodka daily. He was discharged to a behavioral health facility and treated with a Librium  taper during that admission.  Patient Report:  [name], is seen face to face by this provider, consulted with Dr. Merilee; and chart reviewed on 11/11/24.  On evaluation [name] reports he wants inpatient detox from alcohol. During evaluation, Elsie RONAL Louder was resting on the stretcher in no acute distress. He was alert and oriented 4, calm, cooperative, and engaged appropriately with this provider. He awakened to his preferred name "[name]" being called and participated fully in the assessment. His mood appeared depressed with a congruent affect. Speech, behavior, and thought processes were organized, coherent, and goal-directed. There was no objective evidence of psychosis, mania, delusional thinking, or response to internal stimuli.  [name] stated that he had been under the influence earlier and did not mean  the statements he made at that time. He reports that his mental state has improved now that the substances have metabolized. He denies suicidal or homicidal ideation, self-harm intent, hallucinations, or paranoia. He expresses a clear desire for detox only and reports that he received resources during his most recent visit at [facility] He appears comfortable and currently denies cravings, urges, or withdrawal symptoms.  I personally spent a total of 55 minutes in the care of the patient today including preparing to see the patient, getting/reviewing separately obtained history, performing a medically appropriate exam/evaluation, counseling and educating, placing orders, referring and communicating with other health care professionals, documenting clinical information in the EHR, independently interpreting results, communicating results, and coordinating care.  Psych ROS:  Depression: endorses Anxiety:  endorses Mania (lifetime and current): denies Psychosis: (lifetime and current): denies  Review of Systems  Psychiatric/Behavioral:  Positive for depression and substance abuse.   All other systems reviewed and are negative.    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 Living Situation: Homeless Spiritual Hx: none Access to weapons/lethal means: denies   Substance History Alcohol: endorses  Last Drink  07/30/2024 Number of drinks per day unknown History of alcohol withdrawal seizures denies History of DT's endorses Tobacco: endorses Illicit drugs: UDS+ for amphetamines and THC Prescription drug abuse: denies Rehab hx: yes  Exam Findings  Physical Exam:  Vital Signs:  Temp:  [97.6 F (36.4 C)-98.4 F (36.9 C)] 97.6 F (36.4 C) (11/17 0547) Pulse Rate:  [75-105] 75 (11/17 0940) Resp:  [16-18] 16 (11/17 0547) BP: (146-173)/(101-110) 173/101 (11/17 0940) SpO2:  [94 %-99 %] 99 % (11/17 0940) Blood pressure (!) 173/101, pulse 75, temperature 97.6 F (36.4 C), resp. rate 16, SpO2 99%. There is no height or weight on file to calculate BMI.  Physical Exam Vitals and nursing note reviewed.  Eyes:     Pupils: Pupils are equal, round, and reactive to light.  Pulmonary:     Effort: Pulmonary effort is normal.  Skin:    General: Skin is dry.  Neurological:     General: No focal deficit present.     Mental Status: He is alert and oriented to person, place, and time.  Psychiatric:        Attention and Perception: Attention and perception normal.        Mood and Affect: Mood is depressed. Mood is not anxious.        Speech: Speech normal.        Behavior: Behavior normal. Behavior is not agitated. Behavior is cooperative.        Thought Content: Thought content normal.        Cognition and Memory: Cognition and memory normal.        Judgment: Judgment is impulsive.     Mental Status Exam: General Appearance: Disheveled  Orientation:  Full (Time, Place, and Person)  Memory:  Immediate;   Fair Recent;   Fair Remote;   Fair  Concentration:  Concentration: Fair  Recall:  Fair  Attention  Fair  Eye Contact:  Minimal  Speech:  Clear and Coherent  Language:  Fair  Volume:  Decreased  Mood: depressed  Affect:  Congruent  Thought Process:  Goal Directed  Thought Content:  Logical  Suicidal Thoughts:  No  Homicidal Thoughts:  No  Judgement:  Intact  Insight:  Fair  Psychomotor  Activity:  Normal  Akathisia:  No  Fund of Knowledge:  Fair      Assets:  Desire for Improvement  Cognition:  WNL  ADL's:  Intact  AIMS (if indicated):        Other History   These have been pulled in through the EMR, reviewed, and updated if appropriate.  Family History:  The patient's family history includes Alcohol abuse in his unknown relative; Cancer in his unknown relative; Cystic fibrosis in his unknown relative; Heart attack in his unknown relative.  Medical History: Past Medical History:  Diagnosis Date   Alcohol abuse    Anxiety    Chronic pain    Hepatitis C    History of heroin abuse (HCC)    Myocardial infarction (HCC)    Tuberculosis     Surgical History: Past Surgical History:  Procedure Laterality Date   CORONARY STENT PLACEMENT       Medications:   Current Facility-Administered Medications:    acetaminophen  (TYLENOL ) tablet 650 mg, 650 mg, Oral, Q6H PRN, Nooruddin, Saad, MD   amLODipine  (NORVASC ) tablet 5  mg, 5 mg, Oral, Daily, Nooruddin, Saad, MD   aspirin  EC tablet 81 mg, 81 mg, Oral, Daily, Nooruddin, Saad, MD, 81 mg at 11/11/24 0933   atorvastatin  (LIPITOR ) tablet 40 mg, 40 mg, Oral, Daily, Nooruddin, Saad, MD, 40 mg at 11/11/24 0932   chlordiazePOXIDE  (LIBRIUM ) capsule 25 mg, 25 mg, Oral, QID **FOLLOWED BY** [START ON 11/12/2024] chlordiazePOXIDE  (LIBRIUM ) capsule 25 mg, 25 mg, Oral, TID **FOLLOWED BY** [START ON 11/13/2024] chlordiazePOXIDE  (LIBRIUM ) capsule 25 mg, 25 mg, Oral, BH-qamhs **FOLLOWED BY** [START ON 11/14/2024] chlordiazePOXIDE  (LIBRIUM ) capsule 25 mg, 25 mg, Oral, Daily, Nooruddin, Saad, MD   enoxaparin  (LOVENOX ) injection 40 mg, 40 mg, Subcutaneous, Q24H, Nooruddin, Saad, MD, 40 mg at 11/11/24 0935   folic acid  (FOLVITE ) tablet 1 mg, 1 mg, Oral, Daily, Nooruddin, Saad, MD, 1 mg at 11/11/24 0932   hydrOXYzine  (ATARAX ) tablet 25 mg, 25 mg, Oral, Q6H PRN, Nooruddin, Saad, MD   lisinopril  (ZESTRIL ) tablet 20 mg, 20 mg, Oral, Daily,  Nooruddin, Saad, MD, 20 mg at 11/11/24 9066   loperamide  (IMODIUM ) capsule 2-4 mg, 2-4 mg, Oral, PRN, Nooruddin, Saad, MD   LORazepam  (ATIVAN ) tablet 1-4 mg, 1-4 mg, Oral, Q1H PRN, 1 mg at 11/11/24 0932 **OR** LORazepam  (ATIVAN ) injection 1-4 mg, 1-4 mg, Intravenous, Q1H PRN, Nooruddin, Saad, MD   multivitamin with minerals tablet 1 tablet, 1 tablet, Oral, Daily, Nooruddin, Saad, MD   nicotine  (NICODERM CQ  - dosed in mg/24 hours) patch 21 mg, 21 mg, Transdermal, Daily, Nooruddin, Saad, MD, 21 mg at 11/11/24 0936   ondansetron  (ZOFRAN -ODT) disintegrating tablet 4 mg, 4 mg, Oral, Q6H PRN, Nooruddin, Saad, MD   sodium chloride  0.9 % bolus 1,000 mL, 1,000 mL, Intravenous, Once, Lang, John K, PA-C   [START ON 11/12/2024] thiamine  (VITAMIN B1) tablet 100 mg, 100 mg, Oral, Daily, Nooruddin, Saad, MD  Current Outpatient Medications:    amLODipine  (NORVASC ) 5 MG tablet, Take 5 mg by mouth daily., Disp: , Rfl:    aspirin  EC 81 MG tablet, Take 81 mg by mouth daily. Swallow whole., Disp: , Rfl:    atorvastatin  (LIPITOR ) 40 MG tablet, Take 40 mg by mouth daily., Disp: , Rfl:    gabapentin  (NEURONTIN ) 800 MG tablet, Take 1 tablet (800 mg total) by mouth 3 (three) times daily., Disp: 30 tablet, Rfl: 0   lisinopril  (ZESTRIL ) 20 MG tablet, Take 20 mg by mouth daily., Disp: , Rfl:    Multiple Vitamin (MULTIVITAMIN WITH MINERALS) TABS tablet, Take 1 tablet by mouth daily., Disp: 30 tablet, Rfl: 1   nicotine  (NICODERM CQ  - DOSED IN MG/24 HOURS) 21 mg/24hr patch, Place 1 patch (21 mg total) onto the skin daily., Disp: 28 patch, Rfl: 0  Allergies: Allergies  Allergen Reactions   Phenobarbital Anaphylaxis   Chlordiazepoxide  Hcl Nausea And Vomiting, Rash and Other (See Comments)    Chills    Majel GORMAN Ramp, FNP

## 2024-11-11 NOTE — H&P (Cosign Needed Addendum)
 Date: 11/11/2024               Patient Name:  Darrell [name] MRN: [id number]  DOB: 06-29-1961 Age / Sex: 63 y.o., male   PCP: Patient, No Pcp Per         Medical Service: Internal Medicine Teaching Service         Attending Physician: Dr. Lovie Clarity, MD      First Contact: Dr. Libby Blanch, DO     Second Contact: Dr. Armando Rossetti, MD         After Hours (After 5p/  First Contact Pager: [phone]  weekends / holidays): Second Contact Pager: [phone]   SUBJECTIVE   Chief Complaint: Chest Pain   History of Present Illness:   [name] is a 63 year old male with past medical history of substance use disorder, alcohol use disorder, hypertension, previous STEMI in 2014 with stenting, depression, who is presenting with chest pain. He is not interested in talking a this time, but states that he is not having any chest pain anymore. He states that he would just like to rest, so the following information is from chart review, EDP, and EMS.    [name] has presented to the emergency department 10 times in the last 6 months for concerns of alcohol and polysubstance abuse. He was admitted to [facility] health in November 2024 requesting detox at that time he stated that he drinks about a pint of vodka a day wanted to quit drinking.  He was discharged to a behavioral health facility and treated with a Librium  taper.  It appears last night, he was having significant chest pain, per EMS run sheet, he had called emergency services for his chest pain that was dull and radiating to his back that have been going on for years.  He used meth on November 15, and also consumed 6x 24 ounce beers. He declines any current chest pain. He is frustrated at having to repeat the events leading up to the hospitalization, stating Do you not believe me and just states that he wants to detox.   Meds:  Lisinopril  20mg  Gabapentin  800mg  TID Amlodipine  5mg  Lipitor  40mg  ADHD   Past Medical  History Methamphatime and Tobacco Use Disorder  Alcohol Use Disorder STEMI s/p PCI in 2014 Major Depressive Disorder  Social:  Lives With: Unhomed Occupation: Does not work  Support: Unclear if he has family and friends in the area Level of Function: Independent in all ADLs/IADLs PCP: Powell Height FNP, [facility] Substances:  Alcohol - One pint of vodka and 6 beers a day  Methamphetamine - Weekly, last use 11/10/2024 Cocaine - Unclear Tobacco - 40 pack year smoking history    Family History: Unable to ascertain   Allergies: Allergies as of 11/10/2024 - Review Complete 11/10/2024  Allergen Reaction Noted   Phenobarbital Anaphylaxis 08/05/2020   Chlordiazepoxide  hcl Nausea And Vomiting, Rash, and Other (See Comments) 05/18/2017    Review of Systems: A complete ROS was negative except as per HPI.   OBJECTIVE:   Physical Exam: Blood pressure (!) 156/110, pulse (!) 105, temperature 97.6 F (36.4 C), resp. rate 16, SpO2 94%.  Constitutional: Disheveled appearing male, laying in bed, in no acute distress HENT: normocephalic atraumatic, mucous membranes dry Cardiovascular: regular rate and rhythm, no m/r/g Pulmonary/Chest: normal work of breathing on room air, lungs clear to auscultation bilaterally Physical Exam limited as patient wanted to be left alone twice  Labs: CBC    Component  Value Date/Time   WBC 7.6 11/10/2024 2310   RBC 6.45 (H) 11/10/2024 2310   HGB 20.6 (H) 11/10/2024 2310   HGB 14.9 01/18/2015 0347   HCT 59.3 (H) 11/10/2024 2310   HCT 45.1 01/18/2015 0347   PLT 242 11/10/2024 2310   PLT 220 01/18/2015 0347   MCV 91.9 11/10/2024 2310   MCV 91 01/18/2015 0347   MCH 31.9 11/10/2024 2310   MCHC 34.7 11/10/2024 2310   RDW 14.6 11/10/2024 2310   RDW 16.5 (H) 01/18/2015 0347   LYMPHSABS 2.9 07/30/2024 2342   LYMPHSABS 2.0 08/02/2014 0633   MONOABS 0.7 07/30/2024 2342   MONOABS 1.0 08/02/2014 0633   EOSABS 0.1 07/30/2024 2342   EOSABS  0.1 08/02/2014 0633   BASOSABS 0.0 07/30/2024 2342   BASOSABS 0.1 08/02/2014 0633     CMP     Component Value Date/Time   NA 133 (L) 11/10/2024 2310   NA 139 01/18/2015 0347   K 3.7 11/10/2024 2310   K 3.5 01/18/2015 0347   CL 95 (L) 11/10/2024 2310   CL 104 01/18/2015 0347   CO2 22 11/10/2024 2310   CO2 25 01/18/2015 0347   GLUCOSE 116 (H) 11/10/2024 2310   GLUCOSE 83 01/18/2015 0347   BUN <5 (L) 11/10/2024 2310   BUN 7 01/18/2015 0347   CREATININE 0.91 11/10/2024 2310   CREATININE 0.89 01/18/2015 0347   CALCIUM  8.7 (L) 11/10/2024 2310   CALCIUM  8.5 01/18/2015 0347   PROT 6.9 08/27/2024 1117   PROT 7.6 01/18/2015 0347   ALBUMIN 4.2 08/27/2024 1117   ALBUMIN 3.6 01/18/2015 0347   AST 16 08/27/2024 1117   AST 41 (H) 01/18/2015 0347   ALT 11 08/27/2024 1117   ALT 44 01/18/2015 0347   ALKPHOS 52 08/27/2024 1117   ALKPHOS 85 01/18/2015 0347   BILITOT 0.3 08/27/2024 1117   BILITOT 0.4 01/18/2015 0347   GFRNONAA >60 11/10/2024 2310   GFRNONAA >60 01/18/2015 0347   GFRNONAA >60 08/02/2014 0633   GFRAA >60 03/26/2020 2252   GFRAA >60 01/18/2015 0347   GFRAA >60 08/02/2014 9366    Imaging:  Chest X-Ray: No acute cardiopulmonary findings  EKG: personally reviewed my interpretation is normal rate, normal rhythm.  No axis deviation, intervals within normal limits.  Some mild ST depressions in inferior leads, however they appear to be chronic.    ASSESSMENT & PLAN:   Assessment & Plan by Problem: Principal Problem:   Chest pain with high risk for cardiac etiology   Darrell [name] is a 63 y.o. person living with a history of STEMI in 2014 s/p PCI, alcohol abuse, methamphetamine, and cocaine abuse who presented with chest pain and admitted for ischemia eval on hospital day 0  #Chest Pain  #Hx of STEMI in 2014 Patient initially presented with acute chest pain, which at the time he described as dull and achy and radiating slightly to his back.  He does have a history of  a STEMI back in 2014 which did require intervention.  His troponins were mildly elevated at 46, and at 53.  EKG is also reassuring, and patient is not actively having any chest pain after he was given aspirin  324 mg by EMS.   Plan:  - Cardiology consulted, will follow-up recommendations - Restarting his aspirin   #Suicidal Ideations  Mention this to the emergency department provider, he states that he was having passive suicidal ideations with no plan, he is not having suicidal ideations at this time.  Psychiatry consulted, will follow-up their recommendations.  Once medically stable, I think he would benefit from inpatient behavioral health management.  #Alcohol Use Disorder #Methamphetamine Use Disorder  Per chart review, it appears he has been drinking a pint of liquor a day, and per EMS run sheet he had 6 24 ounce beers on the night of November 16.  Last methamphetamine use was also on November 10, 2024.  He does not appear to be acutely intoxicated at this time.  Librium  taper has worked well for him in the past, will start this with as well as as needed Ativan  for withdrawal symptoms.  No history of seizures, or delirium tremens.  Plan:  - Librium  taper - Ativan  injection as needed per CIWA scale -TOC consult placed -He would likely benefit from inpatient detox programs  # Tobacco Use Disorder 40-pack-year smoking history, will give nicotine  patch while inpatient.  #Alcohol Related Neuropathy  Takes Gabapentin  800mg  TID at home, will hold for now given Librium  and Ativan  and at risk for oversedation with alcohol still in his system.   #Hypertension Restarting his home lisinopril  20mg  and amlodipine  5mg   #Erythrocytosis  Hemoglobin currently at 20.6, it appears his baseline is around 17.  Could be in the setting of his chronic tobacco use.  Kidney function appears to be within normal limits.  #Hx of Hep C Last testing that I can see is in 2020 with Hep C antibody positive.  Checking hepatic function panel.   #Hx of Colon Cancer  Per documentation underwent surgery at [facility] in May of 2024, however unable to find documentation of it.    Diet: Normal VTE: Enoxaparin  IVF: None,None Code: Full  Prior to Admission Living Arrangement: Unhomed Anticipated Discharge Location: Rehab Barriers to Discharge: Medical Management  Dispo: Admit patient to Inpatient with expected length of stay greater than 2 midnights.  Signed: Taressa Rauh, MD Internal Medicine Resident PGY-3  11/11/2024, 9:21 AM

## 2024-11-11 NOTE — ED Provider Notes (Addendum)
 [place] EMERGENCY DEPARTMENT AT [facility] Provider Note   CSN: [id number] Arrival date & time: 11/10/24  2255     Patient presents with: Chest Pain and Suicidal  HPI Darrell [name] is a 63 y.o. male with alcohol abuse, CKD, substance abuse, CAD, MDD presenting for chest pain and suicidal thoughts.  He states the chest pain started yesterday but has been intermittent for about a year.  He does not have chest pain at this time he reports.  Unsure of what makes it worse or better.  He states he is just very tired and has been drinking and drugging a lot.  States he used crystal meth yesterday and also drank lots of alcohol.  He reports that he has had passing suicidal and homicidal thoughts but no plans at this time.  Also reports that he is homeless.    Chest Pain      Prior to Admission medications   Medication Sig Start Date End Date Taking? Authorizing Provider  amLODipine  (NORVASC ) 5 MG tablet Take 5 mg by mouth daily.    [provider]  aspirin  EC 81 MG tablet Take 81 mg by mouth daily. Swallow whole.    [provider]  atorvastatin  (LIPITOR ) 40 MG tablet Take 40 mg by mouth daily. 07/09/24   [provider]  gabapentin  (NEURONTIN ) 800 MG tablet Take 1 tablet (800 mg total) by mouth 3 (three) times daily. 08/04/24   Lawrnce Garvin PARAS, MD  lisinopril  (ZESTRIL ) 20 MG tablet Take 20 mg by mouth daily. 03/26/24   [provider]  Multiple Vitamin (MULTIVITAMIN WITH MINERALS) TABS tablet Take 1 tablet by mouth daily. 08/05/24   Lawrnce Garvin PARAS, MD  nicotine  (NICODERM CQ  - DOSED IN MG/24 HOURS) 21 mg/24hr patch Place 1 patch (21 mg total) onto the skin daily. 08/05/24   Lawrnce Garvin PARAS, MD    Allergies: Phenobarbital and Chlordiazepoxide  hcl    Review of Systems  Cardiovascular:  Positive for chest pain.    Updated Vital Signs BP (!) 156/110 (BP Location: Left Arm)   Pulse (!) 105   Temp 97.6 F (36.4 C)   Resp 16    SpO2 94%   Physical Exam Vitals and nursing note reviewed.  Constitutional:      Comments: Seems to be sluggish and tired  HENT:     Head: Normocephalic and atraumatic.     Mouth/Throat:     Mouth: Mucous membranes are moist.  Eyes:     General:        Right eye: No discharge.        Left eye: No discharge.     Conjunctiva/sclera: Conjunctivae normal.  Cardiovascular:     Rate and Rhythm: Normal rate and regular rhythm.     Pulses: Normal pulses.     Heart sounds: Normal heart sounds.  Pulmonary:     Effort: Pulmonary effort is normal.     Breath sounds: Normal breath sounds.  Abdominal:     General: Abdomen is flat.     Palpations: Abdomen is soft.  Skin:    General: Skin is warm and dry.  Neurological:     General: No focal deficit present.     Mental Status: He is easily aroused.  Psychiatric:        Attention and Perception: Attention and perception normal.        Mood and Affect: Mood is depressed.        Speech: Speech normal.  Behavior: Behavior normal. Behavior is cooperative.        Thought Content: Thought content normal.        Cognition and Memory: Cognition and memory normal.     (all labs ordered are listed, but only abnormal results are displayed) Labs Reviewed  BASIC METABOLIC PANEL WITH GFR - Abnormal; Notable for the following components:      Result Value   Sodium 133 (*)    Chloride 95 (*)    Glucose, Bld 116 (*)    BUN <5 (*)    Calcium  8.7 (*)    Anion gap 16 (*)    All other components within normal limits  CBC - Abnormal; Notable for the following components:   RBC 6.45 (*)    Hemoglobin 20.6 (*)    HCT 59.3 (*)    All other components within normal limits  ETHANOL - Abnormal; Notable for the following components:   Alcohol, Ethyl (B) 272 (*)    All other components within normal limits  RAPID URINE DRUG SCREEN, HOSP PERFORMED - Abnormal; Notable for the following components:   Amphetamines POSITIVE (*)    Tetrahydrocannabinol  POSITIVE (*)    All other components within normal limits  TROPONIN I (HIGH SENSITIVITY) - Abnormal; Notable for the following components:   Troponin I (High Sensitivity) 46 (*)    All other components within normal limits  TROPONIN I (HIGH SENSITIVITY) - Abnormal; Notable for the following components:   Troponin I (High Sensitivity) 53 (*)    All other components within normal limits    EKG: None  Radiology: DG Chest 2 View Result Date: 11/10/2024 EXAM: 2 VIEW(S) XRAY OF THE CHEST 11/10/2024 11:25:00 PM COMPARISON: None available. CLINICAL HISTORY: chest pain chest pain FINDINGS: LUNGS AND PLEURA: No focal pulmonary opacity. No pleural effusion. No pneumothorax. HEART AND MEDIASTINUM: No acute abnormality of the cardiac and mediastinal silhouettes. BONES AND SOFT TISSUES: No acute osseous abnormality. IMPRESSION: 1. No acute process. Electronically signed by: Dorethia Molt MD 11/10/2024 11:33 PM EST RP Workstation: [id number]     Procedures   Medications Ordered in the ED  sodium chloride  0.9 % bolus 1,000 mL (has no administration in time range)                                    Medical Decision Making Amount and/or Complexity of Data Reviewed Labs: ordered. Radiology: ordered.  Risk Decision regarding hospitalization.   Initial Impression and Ddx 63 year old well-appearing male presenting for suicidal ideation no chest pain.  Exam mostly unremarkable he appeared fatigued.  DDx includes alcohol intoxication, alcohol withdrawal, ACS, PE, aortic dissection, electrolyte derangement, dehydration, other. Patient PMH that increases complexity of ED encounter:  lcohol abuse, CKD, substance abuse, CAD, MDD  Interpretation of Diagnostics - I independent reviewed and interpreted the labs as followed: First troponin 46, second troponin 53, alcohol 272, urine drug positive for amphetamines and THC  - I independently visualized the following imaging with scope of interpretation  limited to determining acute life threatening conditions related to emergency care: Chest x-ray, which revealed no acute findings  -I personally reviewed and interpreted EKG which revealed NSR  Patient Reassessment and Ultimate Disposition/Management On reassessment he remained well-appearing, resting comfortably in his bed.  Still without chest pain.  However given rising troponins and risk factors for ACS, felt it warranted admission for continued evaluation.  Discussed with Dr. Henry of cardiology  who agreed and plans to see him later today.  Dr. Levander and I also evaluated the patient and mutually agreed he would benefit from admission.  Admitted to unassigned hospital team.  His suicidal thoughts seem passive and does not have a plan at this time.  Do feel he would benefit from TTS evaluation but at this time does not meet IVC criteria.  Also patient has been intermittently tachycardic.  For now will treat with IV fluids.   Patient management required discussion with the following services or consulting groups:  Hospitalist Service and Cardiology  Complexity of Problems Addressed Acute complicated illness or Injury  Additional Data Reviewed and Analyzed Further history obtained from: Past medical history and medications listed in the EMR and Prior ED visit notes  Patient Encounter Risk Assessment Consideration of hospitalization      Final diagnoses:  Chest pain, unspecified type  Suicidal ideations    ED Discharge Orders     None         Lang Norleen POUR, PA-C 11/11/24 0910    Bari Charmaine FALCON, MD 11/15/24 2306

## 2024-11-11 NOTE — Progress Notes (Signed)
 HD#0 SUBJECTIVE:  Patient Summary: [name] is a 63 year old male with past medical history of substance use disorder, alcohol use disorder, hypertension, previous STEMI in 2014 with stenting, depression, who is presenting with chest pain.  Overnight Events: NAEON  Interim History:Patient was sitting in bed without acute distress. He denied chest pain, shortness of breath, or any new concerns aside from possible withdrawal symptoms. He expressed frustration about not having a bed assignment, which contributed to his restlessness.  OBJECTIVE:  Vital Signs: Vitals:   11/11/24 1346 11/11/24 1510 11/11/24 1714 11/11/24 1756  BP: (!) 153/98 130/78 128/88   Pulse: (!) 108 (!) 105 (!) 102   Resp: 16 20 20    Temp:    99 F (37.2 C)  TempSrc:    Oral  SpO2: 100% 97% 95%    Supplemental O2: Room Air SpO2: 95 %  There were no vitals filed for this visit.  No intake or output data in the 24 hours ending 11/11/24 1809 Net IO Since Admission: No IO data has been entered for this period [11/11/24 1809]  Physical Exam: Physical Exam Constitutional: Disheveled appearing male, laying in bed, in no acute distress but anxious Abdomen: soft, non distended without tenderness HENT: normocephalic atraumatic, mucous membranes dry Eye: Erythematous conjunctivae  Cardiovascular: regular rate and rhythm, no m/r/g Pulmonary/Chest: normal work of breathing on room air, lungs clear to auscultation bilaterally Neuro: Anxious and upper ext tremor  Patient Lines/Drains/Airways Status     Active Line/Drains/Airways     Name Placement date Placement time Site Days   Peripheral IV 11/11/24 20 G 1.88 Posterior;Right Forearm 11/11/24  1525  Forearm  less than 1            Pertinent labs and imaging:      Latest Ref Rng & Units 11/10/2024   11:10 PM 08/27/2024   11:17 AM 07/30/2024   11:42 PM  CBC  WBC 4.0 - 10.5 K/uL 7.6  8.5  6.1   Hemoglobin 13.0 - 17.0 g/dL 79.3  83.4  82.7    Hematocrit 39.0 - 52.0 % 59.3  49.3  51.6   Platelets 150 - 400 K/uL 242  249  195        Latest Ref Rng & Units 11/11/2024    1:27 AM 11/10/2024   11:10 PM 08/27/2024   11:17 AM  CMP  Glucose 70 - 99 mg/dL  883  868   BUN 8 - 23 mg/dL  <5  9   Creatinine 9.38 - 1.24 mg/dL  9.08  8.99   Sodium 864 - 145 mmol/L  133  138   Potassium 3.5 - 5.1 mmol/L  3.7  4.1   Chloride 98 - 111 mmol/L  95  104   CO2 22 - 32 mmol/L  22  20   Calcium  8.9 - 10.3 mg/dL  8.7  9.4   Total Protein 6.5 - 8.1 g/dL 6.2   6.9   Total Bilirubin 0.0 - 1.2 mg/dL 1.6   0.3   Alkaline Phos 38 - 126 U/L 75   52   AST 15 - 41 U/L 38   16   ALT 0 - 44 U/L 28   11     DG Chest 2 View Result Date: 11/10/2024 EXAM: 2 VIEW(S) XRAY OF THE CHEST 11/10/2024 11:25:00 PM COMPARISON: None available. CLINICAL HISTORY: chest pain chest pain FINDINGS: LUNGS AND PLEURA: No focal pulmonary opacity. No pleural effusion. No pneumothorax. HEART AND MEDIASTINUM:  No acute abnormality of the cardiac and mediastinal silhouettes. BONES AND SOFT TISSUES: No acute osseous abnormality. IMPRESSION: 1. No acute process. Electronically signed by: Dorethia Molt MD 11/10/2024 11:33 PM EST Darrell [name]: [id number]    ASSESSMENT/PLAN:  Assessment: Principal Problem:   Chest pain with high risk for cardiac etiology Active Problems:   Tobacco use disorder   Substance abuse (HCC)   Hypertension   Depression   Alcohol use disorder, severe, dependence (HCC)   Major depressive disorder, recurrent episode, moderate (HCC)   Plan: Darrell [name] is a 63 y.o. person living with a history of STEMI in 2014 s/p PCI, alcohol abuse, methamphetamine, and cocaine abuse who presented with chest pain and admitted for ischemia eval on hospital day 0   #Chest Pain  #Hx of STEMI in 2014 Patient initially presented with acute chest pain, which at the time he described as dull and achy and radiating slightly to his back.  He does have a history of a STEMI  back in 2014 which did require intervention.  His troponins were mildly elevated at 46, and at 53.  EKG is also reassuring, and patient is not actively having any chest pain after he was given aspirin  325 mg by EMS.  Differential diagnosis: Demand ischemia/possible pancreatitis/esophagitis/musculoskeletal pain  Cardiology consulted:Elevated troponin, likely demand ischemia in the setting of polysubstance abuse - echo ordered. If no significant abnormalities, cardiology will sign off. Rest of plan as per APP note. Plan:  - Restarting his aspirin  and statin - Echo was done  - Check lipase if persistent abdominal pain radiating to back - pantoprazole  40 daily  - Lido patch and diclofenac gel if MSK  #Alcohol Use Disorder #Methamphetamine Use Disorder  Per chart review, it appears he has been drinking a pint of liquor a day, and per EMS run sheet he had 6 24 ounce beers on the night of November 16.  Last methamphetamine use was also on November 10, 2024.  He does not appear to be acutely intoxicated at this time.  Librium  taper has worked well for him in the past, will start this with as well as as needed Ativan  for withdrawal symptoms.  No history of seizures, or delirium tremens.  Could not check CIWA score last night, patient denied telemetry.Morning CIWA: 7 and went up to 9. Plan:  - Librium  taper - Thiamine   - Naltrexone 50 daily - Ativan  injection as needed per CIWA scale -TOC consult placed - He would likely benefit from inpatient detox programs - Consider starting naltrexone   #Suicidal Ideations  Mention this to the emergency department provider, he states that he was having passive suicidal ideations with no plan, he is not having suicidal ideations at this time.  Psychiatry consulted, will follow-up their recommendations.  Once medically stable, I think he would benefit from inpatient behavioral health management.  -Hydroxyzine   # Tobacco Use Disorder 40-pack-year smoking history,  will give nicotine  patch while inpatient.   #Alcohol Related Neuropathy  Takes Gabapentin  800mg  TID at home, will hold for now given Librium  and Ativan  and at risk for oversedation with alcohol still in his system.    #Hypertension Restarting his home lisinopril  20mg  and amlodipine  5mg    #Erythrocytosis  Hemoglobin currently at 20.6, it appears his baseline is around 17.  Could be in the setting of his chronic tobacco use.  Kidney function appears to be within normal limits.   #Hx of Hep C Last testing that I can see is in 2020 with  Hep C antibody positive. Checking hepatic function panel.    #Hx of Colon Cancer  Per documentation underwent surgery at [facility] in May of 2024, however unable to find documentation of it.    Best Practice: Diet: mechanical soft  IVF: N/A VTE: enoxaparin  (LOVENOX ) injection 40 mg Start: 11/11/24 0845 Code: Full  Disposition planning:   Signature:  Izack Hoogland [facility]  6:09 PM, 11/11/2024  On Call pager [phone]

## 2024-11-11 NOTE — ED Notes (Signed)
 Second trop discontinued.

## 2024-11-11 NOTE — ED Notes (Signed)
 Pt belongings placed in locker 2, valuables given to security.

## 2024-11-11 NOTE — Consult Note (Addendum)
 Cardiology Consultation   Patient ID: Darrell [name] MRN: [id number]; DOB: 1961/10/30  Admit date: 11/10/2024 Date of Consult: 11/11/2024  PCP:  Patient, No Pcp Per   [facility] Providers Cardiologist:  Dr. Hassell ([facility]) Emeline FORBES Calender, MD     Patient Profile: Darrell [name] is a 63 y.o. male with a hx of STEMI s/p BMS to RCA, polysubstance abuse, HTN who is being seen 11/11/2024 for the evaluation of chest pain at the request of Dr. Lovie.  History of Present Illness: Darrell [name] is a 63 year old male with past medical history noted above.  According to notes he was admitted to [facility] in [place]  in 2014 with an inferior STEMI for which he underwent bare-metal stenting of the RCA and was placed on aspirin  and Plavix .  Two days later he left AMA.  Presented to [facility] in 2015 with chest pain and abnormal Myoview.  Cardiology was consulted at that time but recommended medical therapy given his history of noncompliance.  Had a Myoview at [facility] 08/2019 which showed old inferior infarction and no ischemic changes.  He was seen by [facility] cardiology 12/2023 with complaints of chest pain.  At that time he was sustaining from alcohol and in a recovery house.  At that time further ischemic testing was deferred.  Presented to the [facility] ED on 11/15 for ongoing EtOH use.  He was evaluated and discharged.  Presented back to the [facility] ED on 11/16 with complaints of chest pain and running out of his medication.  Initial triage notes indicate reports of SI and HI.   In the ED his labs showed sodium 133, potassium 3.7, creatinine 0.91, high-sensitivity troponin 46>> 53, WBC 7.6, hemoglobin 20.6.  Urine drug screen positive for amphetamines and marijuana.  Chest x-ray negative.  EKG showed sinus tachycardia, 100 bpm, nonspecific changes.  Admitted to teaching service for further management.  Cardiology asked to evaluate.  In talking with patient, he  reports no chest pain presently. Had some brief pain last evening while walking around. Reports he really just wants to get clean. Drinks heavily and last methamphetamine use was yesterday. Has run out of all his medications. Currently living on the street.    Past Medical History:  Diagnosis Date   Alcohol abuse    Anxiety    Chronic pain    Hepatitis C    History of heroin abuse (HCC)    Myocardial infarction (HCC)    Tuberculosis     Past Surgical History:  Procedure Laterality Date   CORONARY STENT PLACEMENT       Scheduled Meds:  amLODipine   5 mg Oral Daily   aspirin  EC  81 mg Oral Daily   atorvastatin   40 mg Oral Daily   chlordiazePOXIDE   25 mg Oral QID   Followed by   NOREEN ON 11/12/2024] chlordiazePOXIDE   25 mg Oral TID   Followed by   NOREEN ON 11/13/2024] chlordiazePOXIDE   25 mg Oral BH-qamhs   Followed by   NOREEN ON 11/14/2024] chlordiazePOXIDE   25 mg Oral Daily   enoxaparin  (LOVENOX ) injection  40 mg Subcutaneous Q24H   folic acid   1 mg Oral Daily   lisinopril   20 mg Oral Daily   multivitamin with minerals  1 tablet Oral Daily   nicotine   21 mg Transdermal Daily   [START ON 11/12/2024] thiamine   100 mg Oral Daily   Continuous Infusions:  sodium chloride      PRN Meds: acetaminophen , hydrOXYzine , loperamide ,  LORazepam  **OR** LORazepam , ondansetron   Allergies:    Allergies  Allergen Reactions   Phenobarbital Anaphylaxis   Chlordiazepoxide  Hcl Nausea And Vomiting, Rash and Other (See Comments)    Chills     Social History:   Social History   Socioeconomic History   Marital status: Single    Spouse name: Not on file   Number of children: Not on file   Years of education: Not on file   Highest education level: Not on file  Occupational History   Occupation: Unemployed  Tobacco Use   Smoking status: Every Day    Current packs/day: 1.00    Types: Cigarettes   Smokeless tobacco: Never   Tobacco comments:    trying to quit  Substance and  Sexual Activity   Alcohol use: Not Currently    Comment: Daily.   Drug use: Not Currently    Types: Marijuana, Cocaine    Comment: Every once in a while    Sexual activity: Not on file  Other Topics Concern   Not on file  Social History Narrative   Homeless    Smoking 1.5 ppd    Social Drivers of Health   Financial Resource Strain: Medium Risk (01/15/2024)   Received from [facility]   Overall Financial Resource Strain (CARDIA)    Difficulty of Paying Living Expenses: Somewhat hard  Food Insecurity: No Food Insecurity (07/31/2024)   Hunger Vital Sign    Worried About Running Out of Food in the Last Year: Never true    Ran Out of Food in the Last Year: Never true  Transportation Needs: No Transportation Needs (07/31/2024)   PRAPARE - Administrator, Civil Service (Medical): No    Lack of Transportation (Non-Medical): No  Recent Concern: Transportation Needs - High Risk (05/20/2024)   Received from [facility]   Transportation Needs    Within the past 12 months, has a lack of transportation kept you from medical appointments or from doing things needed for daily living?: Yes  Physical Activity: Insufficiently Active (01/15/2024)   Received from [facility]   Exercise Vital Sign    On average, how many days per week do you engage in moderate to strenuous exercise (like a brisk walk)?: 3 days    On average, how many minutes do you engage in exercise at this level?: 20 min  Stress: Stress Concern Present (01/15/2024)   Received from [facility] of Occupational Health - Occupational Stress Questionnaire    Feeling of Stress : Rather much  Social Connections: Moderately Integrated (01/15/2024)   Received from [facility] [facility]   Social Network    How would you rate your social network (family, work, friends)?: Adequate participation with social networks  Intimate Partner Violence: Not At Risk (07/31/2024)   Humiliation, Afraid, Rape, and  Kick questionnaire    Fear of Current or Ex-Partner: No    Emotionally Abused: No    Physically Abused: No    Sexually Abused: No    Family History:    Family History  Problem Relation Age of Onset   Heart attack Unknown        paternal uncle died MI 4    Cancer Unknown        lung-maternal GM   Cystic fibrosis Unknown        mom. died with pna   Alcohol abuse Unknown        brother     ROS:  Please  see the history of present illness.   All other ROS reviewed and negative.     Physical Exam/Data: Vitals:   11/11/24 0709 11/11/24 0940 11/11/24 1232 11/11/24 1346  BP:  (!) 173/101 (!) 162/112 (!) 153/98  Pulse:  75 (!) 111 (!) 108  Resp:    16  Temp:      TempSrc:      SpO2: 99% 99%  100%   No intake or output data in the 24 hours ending 11/11/24 1403    11/09/2024    1:04 AM 08/27/2024   11:14 AM 07/30/2024   11:08 PM  Last 3 Weights  Weight (lbs) 205 lb 205 lb 180 lb  Weight (kg) 92.987 kg 92.987 kg 81.647 kg     There is no height or weight on file to calculate BMI.  General:  Disheveled older male  HEENT: normal Neck: no JVD Vascular: No carotid bruits; Distal pulses 2+ bilaterally Cardiac:  normal S1, S2; RRR; no murmur  Lungs:  clear to auscultation bilaterally, no wheezing, rhonchi or rales  Abd: soft, nontender, no hepatomegaly  Ext: no edema Musculoskeletal:  No deformities, BUE and BLE strength normal and equal Skin: warm and dry  Neuro:  no focal abnormalities noted Psych:  Normal affect   EKG:  The EKG was personally reviewed and demonstrates:  sinus tachycardia, 100 bpm, nonspecific changes  Telemetry:  Telemetry was personally reviewed and demonstrates:  N/a   Relevant CV Studies:  N/a   Laboratory Data: High Sensitivity Troponin:   Recent Labs  Lab 11/10/24 2310 11/11/24 0127  TROPONINIHS 46* 53*     Chemistry Recent Labs  Lab 11/10/24 2310  NA 133*  K 3.7  CL 95*  CO2 22  GLUCOSE 116*  BUN <5*  CREATININE 0.91  CALCIUM   8.7*  GFRNONAA >60  ANIONGAP 16*    Recent Labs  Lab 11/11/24 0127  PROT 6.2*  ALBUMIN 3.2*  AST 38  ALT 28  ALKPHOS 75  BILITOT 1.6*   Lipids No results for input(s): CHOL, TRIG, HDL, LABVLDL, LDLCALC, CHOLHDL in the last 168 hours.  Hematology Recent Labs  Lab 11/10/24 2310  WBC 7.6  RBC 6.45*  HGB 20.6*  HCT 59.3*  MCV 91.9  MCH 31.9  MCHC 34.7  RDW 14.6  PLT 242   Thyroid  No results for input(s): TSH, FREET4 in the last 168 hours.  BNPNo results for input(s): BNP, PROBNP in the last 168 hours.  DDimer No results for input(s): DDIMER in the last 168 hours.  Radiology/Studies:  DG Chest 2 View Result Date: 11/10/2024 EXAM: 2 VIEW(S) XRAY OF THE CHEST 11/10/2024 11:25:00 PM COMPARISON: None available. CLINICAL HISTORY: chest pain chest pain FINDINGS: LUNGS AND PLEURA: No focal pulmonary opacity. No pleural effusion. No pneumothorax. HEART AND MEDIASTINUM: No acute abnormality of the cardiac and mediastinal silhouettes. BONES AND SOFT TISSUES: No acute osseous abnormality. IMPRESSION: 1. No acute process. Electronically signed by: Dorethia Molt MD 11/10/2024 11:33 PM EST RP Workstation: [id number]     Assessment and Plan:  Darrell [name] is a 63 y.o. male with a hx of STEMI s/p BMS to RCA, polysubstance abuse, HTN who is being seen 11/11/2024 for the evaluation of chest pain at the request of Dr. Lovie.  Chest pain  Hx of CAD s/p BMS to the RCA -- Cath 2015 in the setting of STEMI with BMS to the RCA -- hx of noncompliance, ischemic evaluation has been deferred in the past in this setting --  reports brief episode of chest pain last evening while walking around, says he exerted himself to much. Has been out of all his medications -- recent methamphetamine 11/16 -- hsTn 46>>53, EKG without ischemic changes, no further chest pain -- suspect demand ischemia in the setting of poorly controlled BP and drug use.  -- will check echo  -- resume  ASA, statin  HTN -- elevated, reports being out of his meds -- has been started on amlodipine  5mg  daily, lisinopril  20mg  daily   HLD -- resumed on atorvastatin  40mg  daily   Per Primary Suicidal ideation- pending psych evaluation  Polysubstance abuse (methamphetamine, EtOH, tobacco, marijuana) History of hep C  For questions or updates, please contact [facility] Please consult [email or web address] for contact info under    Signed, Manuelita Rummer, NP  11/11/2024 2:03 PM   Patient seen and examined, note reviewed with the signed Advanced Practice Provider. I personally reviewed laboratory data, imaging studies and relevant notes. I independently examined the patient and formulated the important aspects of the plan. I have personally discussed the plan with the patient and/or family. Comments or changes to the note/plan are indicated below.  HPI: This is a 63 year old male with a history of polysubstance abuse, STEMI s/p BMS to RCA, HTN for whom cardiology was consulted by Dr. Lovie due to elevated troponin (46->53) in the setting of amphetamine, THC and alcohol abuse.   My Exam:  Physical Exam Vitals and nursing note reviewed.  Constitutional:      Appearance: Normal appearance.     Comments: lethargic  HENT:     Head: Normocephalic and atraumatic.  Eyes:     Conjunctiva/sclera: Conjunctivae normal.  Neck:     Vascular: No carotid bruit.  Cardiovascular:     Rate and Rhythm: Normal rate and regular rhythm.  Pulmonary:     Effort: Pulmonary effort is normal.     Breath sounds: Normal breath sounds.  Musculoskeletal:        General: No swelling or tenderness.  Skin:    Coloration: Skin is not jaundiced or pale.  Neurological:     Mental Status: He is alert.     EKG: NSR - Personally reviewed  Assessment & Plan:  Elevated troponin, likely demand ischemia in the setting of polysubstance abuse - echo ordered. If no significant abnormalities, cardiology will  sign off. Rest of plan as per APP note.  HTN HLD Polysubstance abuse CAD s/p BMS to RCA and STEMI in 2015   Time coordinating patient care: 60 minutes  Signed, Emeline Calender, DO [place]  [facility]  11/11/2024 5:51 PM

## 2024-11-11 NOTE — ED Notes (Addendum)
 2 hours repeat trop should be at 1927 from the first draw

## 2024-11-11 NOTE — Hospital Course (Addendum)
#   Chest Pain / History of STEMI (2014) The patient presented with dull chest pain that improved after receiving aspirin  from EMS. EKG showed no acute ischemic changes. Troponins were mildly elevated (46 ? 53), consistent with demand ischemia in the setting of alcohol and polysubstance use. Chest X-ray showed no acute process. Echocardiogram revealed normal LV systolic function, mild LVH, and no regional wall motion abnormalities. Cardiology evaluated the patient and, with a reassuring echo and stable symptoms, recommended no further cardiac intervention and signed off.  # Alcohol Use Disorder / Polysubstance Use (Methamphetamine, Cocaine) The patient reported heavy alcohol intake (1 pint liquor daily + 6 beers on 11/16) and methamphetamine use on 11/16. He developed early alcohol withdrawal with tremors and anxiety. CIWA scores increased from 7 to 9. He was started on a Librium  taper, thiamine , nicotine  patch, and PRN Ativan . He remained at risk for worsening withdrawal and was not yet medically ready for discharge, requiring continued inpatient monitoring. A TOC consult was placed. He would benefit from inpatient detox resources.  # Suicidal Ideation (Passive) In the ED, he endorsed passive suicidal thoughts without a plan. On the floor, he denied active SI. Psychiatry was consulted and recommended supportive management. He was started on hydroxyzine  for anxiety. Plan was for possible transfer to inpatient behavioral health when medically stable.  # Hypertension Home antihypertensives (lisinopril  20 mg, amlodipine  5 mg) were restarted with adequate BP control during hospitalization.  # Tobacco Use Disorder He has a 40-pack-year history. Nicotine  patch was initiated during admission.  # Alcohol-Related Neuropathy Home gabapentin  800 mg TID was held due to high sedation risk when combined with Librium  and Ativan  for withdrawal management.  # Erythrocytosis Hemoglobin was elevated at 20.6,  consistent with chronic tobacco use. Renal function remained normal. No acute intervention was required.  # History of Hepatitis C Hepatic function panel was checked due to known past Hep C antibody positivity.  # History of Colon Cancer Per chart history, he underwent colon surgery in 2024, though documentation was limited. No acute issues related to this during hospitalization.  Disposition Despite ongoing alcohol withdrawal treatment, psychiatric evaluation, and medical recommendations to remain hospitalized for safety, the patient left the hospital against medical advice (AMA) on 11/12/2024.

## 2024-11-11 NOTE — TOC CM/SW Note (Signed)
 TOC consult received for substance abuse. SW to f/u with patient.  Merilee Batty, MSN, RN Case Management [phone]

## 2024-11-12 ENCOUNTER — Inpatient Hospital Stay (HOSPITAL_COMMUNITY): Payer: MEDICAID

## 2024-11-12 DIAGNOSIS — Z79899 Other long term (current) drug therapy: Secondary | ICD-10-CM

## 2024-11-12 DIAGNOSIS — F10930 Alcohol use, unspecified with withdrawal, uncomplicated: Secondary | ICD-10-CM

## 2024-11-12 DIAGNOSIS — Z85038 Personal history of other malignant neoplasm of large intestine: Secondary | ICD-10-CM

## 2024-11-12 DIAGNOSIS — F1090 Alcohol use, unspecified, uncomplicated: Secondary | ICD-10-CM

## 2024-11-12 DIAGNOSIS — R079 Chest pain, unspecified: Secondary | ICD-10-CM

## 2024-11-12 DIAGNOSIS — Z5900 Homelessness unspecified: Secondary | ICD-10-CM

## 2024-11-12 DIAGNOSIS — G621 Alcoholic polyneuropathy: Secondary | ICD-10-CM

## 2024-11-12 DIAGNOSIS — F17210 Nicotine dependence, cigarettes, uncomplicated: Secondary | ICD-10-CM | POA: Diagnosis not present

## 2024-11-12 DIAGNOSIS — Z8619 Personal history of other infectious and parasitic diseases: Secondary | ICD-10-CM

## 2024-11-12 DIAGNOSIS — I252 Old myocardial infarction: Secondary | ICD-10-CM | POA: Diagnosis not present

## 2024-11-12 LAB — RENAL FUNCTION PANEL
Albumin: 3 g/dL — ABNORMAL LOW (ref 3.5–5.0)
Anion gap: 16 — ABNORMAL HIGH (ref 5–15)
BUN: 11 mg/dL (ref 8–23)
CO2: 23 mmol/L (ref 22–32)
Calcium: 8.9 mg/dL (ref 8.9–10.3)
Chloride: 96 mmol/L — ABNORMAL LOW (ref 98–111)
Creatinine, Ser: 1.2 mg/dL (ref 0.61–1.24)
GFR, Estimated: >60 mL/min (ref >60)
Glucose, Bld: 88 mg/dL (ref 70–99)
Phosphorus: 4.5 mg/dL (ref 2.5–4.6)
Potassium: 4.5 mmol/L (ref 3.5–5.1)
Sodium: 135 mmol/L (ref 135–145)

## 2024-11-12 LAB — ECHOCARDIOGRAM COMPLETE
AR max vel: 3.26 cm2
AV Area VTI: 3.26 cm2
AV Area mean vel: 3.11 cm2
AV Mean grad: 3 mmHg
AV Peak grad: 5.2 mmHg
Ao pk vel: 1.14 m/s
Area-P 1/2: 4.33 cm2
S' Lateral: 3.1 cm

## 2024-11-12 LAB — CBC
HCT: 54.5 % — ABNORMAL HIGH (ref 39.0–52.0)
Hemoglobin: 18.4 g/dL — ABNORMAL HIGH (ref 13.0–17.0)
MCH: 32 pg (ref 26.0–34.0)
MCHC: 33.8 g/dL (ref 30.0–36.0)
MCV: 94.8 fL (ref 80.0–100.0)
Platelets: 158 K/uL (ref 150–400)
RBC: 5.75 MIL/uL (ref 4.22–5.81)
RDW: 14.1 % (ref 11.5–15.5)
WBC: 6.7 K/uL (ref 4.0–10.5)
nRBC: 0 % (ref 0.0–0.2)

## 2024-11-12 LAB — HIV ANTIBODY (ROUTINE TESTING W REFLEX): HIV Screen 4th Generation wRfx: NONREACTIVE

## 2024-11-12 LAB — MAGNESIUM: Magnesium: 1.8 mg/dL (ref 1.7–2.4)

## 2024-11-12 MED ORDER — PANTOPRAZOLE SODIUM 40 MG PO TBEC
40.0000 mg | DELAYED_RELEASE_TABLET | Freq: Every day | ORAL | Status: DC
  Administered 2024-11-12: 40 mg via ORAL
  Filled 2024-11-12: qty 1

## 2024-11-12 MED ORDER — NALTREXONE HCL 50 MG PO TABS
50.0000 mg | ORAL_TABLET | Freq: Every day | ORAL | Status: DC
  Administered 2024-11-12: 50 mg via ORAL
  Filled 2024-11-12 (×2): qty 1

## 2024-11-12 NOTE — Progress Notes (Signed)
 Provided education on importance of putting telemetry monitor on, patient continuing to refuse, Consulting Civil Engineer and on call internal medicine team made aware.

## 2024-11-12 NOTE — ED Notes (Signed)
 Patient is refusing to have cardiac monitoring applied to him. Patient does not wan the leads on him and states that his chest pain is gone.

## 2024-11-12 NOTE — ED Notes (Signed)
 Pt continues to refuse cardiac tele. Provider team aware.

## 2024-11-12 NOTE — ED Notes (Signed)
 CIWA not done at 0000 patient currently sleeping.

## 2024-11-12 NOTE — Progress Notes (Addendum)
 Progress Note  Patient Name: Darrell [name] Date of Encounter: 11/12/2024  Primary Cardiologist: Emeline FORBES Calender, MD   Subjective   Sleeping comfortably in the hall. No complaints   Inpatient Medications    Scheduled Meds:  amLODipine   5 mg Oral Daily   aspirin  EC  81 mg Oral Daily   atorvastatin   40 mg Oral Daily   chlordiazePOXIDE   25 mg Oral QID   Followed by   chlordiazePOXIDE   25 mg Oral TID   Followed by   NOREEN ON 11/13/2024] chlordiazePOXIDE   25 mg Oral BH-qamhs   Followed by   NOREEN ON 11/14/2024] chlordiazePOXIDE   25 mg Oral Daily   enoxaparin  (LOVENOX ) injection  40 mg Subcutaneous Q24H   folic acid   1 mg Oral Daily   lisinopril   20 mg Oral Daily   multivitamin with minerals  1 tablet Oral Daily   nicotine   21 mg Transdermal Daily   pantoprazole   40 mg Oral Daily   thiamine   100 mg Oral Daily   Continuous Infusions:  PRN Meds: acetaminophen , hydrOXYzine , LORazepam  **OR** LORazepam , ondansetron    Vital Signs    Vitals:   11/11/24 1714 11/11/24 1756 11/12/24 0159 11/12/24 0159  BP: 128/88  134/87 134/87  Pulse: (!) 102  96 96  Resp: 20   16  Temp:  99 F (37.2 C)  99.1 F (37.3 C)  TempSrc:  Oral  Oral  SpO2: 95%   94%    Intake/Output Summary (Last 24 hours) at 11/12/2024 0729 Last data filed at 11/11/2024 2028 Gross per 24 hour  Intake 1000 ml  Output --  Net 1000 ml   There were no vitals filed for this visit.  Telemetry    Patient refused monitor  ECG    NSR with nonspecific ST changes - Personally Reviewed  Physical Exam   Physical Exam Vitals and nursing note reviewed.  Constitutional:      Appearance: Normal appearance.  HENT:     Head: Normocephalic and atraumatic.  Eyes:     Conjunctiva/sclera: Conjunctivae normal.  Cardiovascular:     Rate and Rhythm: Normal rate and regular rhythm.  Pulmonary:     Effort: Pulmonary effort is normal.     Breath sounds: Normal breath sounds.  Musculoskeletal:        General:  No swelling or tenderness.  Skin:    Coloration: Skin is not jaundiced or pale.  Neurological:     Mental Status: He is alert.      Labs    Chemistry Recent Labs  Lab 11/10/24 2310 11/11/24 0127  NA 133*  --   K 3.7  --   CL 95*  --   CO2 22  --   GLUCOSE 116*  --   BUN <5*  --   CREATININE 0.91  --   CALCIUM  8.7*  --   PROT  --  6.2*  ALBUMIN  --  3.2*  AST  --  38  ALT  --  28  ALKPHOS  --  75  BILITOT  --  1.6*  GFRNONAA >60  --   ANIONGAP 16*  --      Hematology Recent Labs  Lab 11/10/24 2310  WBC 7.6  RBC 6.45*  HGB 20.6*  HCT 59.3*  MCV 91.9  MCH 31.9  MCHC 34.7  RDW 14.6  PLT 242    Cardiac EnzymesNo results for input(s): TROPONINI in the last 168 hours. No results for input(s): TROPIPOC in the last  168 hours.   BNPNo results for input(s): BNP, PROBNP in the last 168 hours.   DDimer No results for input(s): DDIMER in the last 168 hours.   Radiology    DG Chest 2 View Result Date: 11/10/2024 EXAM: 2 VIEW(S) XRAY OF THE CHEST 11/10/2024 11:25:00 PM COMPARISON: None available. CLINICAL HISTORY: chest pain chest pain FINDINGS: LUNGS AND PLEURA: No focal pulmonary opacity. No pleural effusion. No pneumothorax. HEART AND MEDIASTINUM: No acute abnormality of the cardiac and mediastinal silhouettes. BONES AND SOFT TISSUES: No acute osseous abnormality. IMPRESSION: 1. No acute process. Electronically signed by: Dorethia Molt MD 11/10/2024 11:33 PM EST RP Workstation: [id number]    Cardiac Studies     Patient Profile     This is a 63 year old male with a history of polysubstance abuse, STEMI s/p BMS to RCA, HTN for whom cardiology was consulted by Dr. Lovie due to elevated troponin (46->53) in the setting of amphetamine, THC and alcohol abuse.   Assessment & Plan  Elevated troponin, likely demand ischemia in the setting of polysubstance abuse - echo ordered. If no significant abnormalities, cardiology will sign off.   HTN HLD Polysubstance abuse CAD and inferior STEMI s/p BMS to RCA in 2015 continue aspirin , statin, lisinopril     For questions or updates, please contact [facility] Please consult [email or web address] for contact info under    Time spent coordinating care: 35 minutes    Signed, Emeline Calender, DO 11/12/2024, 7:29 AM

## 2024-11-13 NOTE — Progress Notes (Signed)
 Pt agitated this am and stated that he is going outside to see his brother. RN stated that pt couldn't leave the dept but brother was welcome to come and visit in his room. Pt adamant about leaving unit. RN informed pt that leaving the dept would be considered discharging himself and he would need to go back through the ED for admission. Pt verbalized understanding. AMA form signed and placed in chart. IV removed. Will notify MD

## 2024-11-13 NOTE — Plan of Care (Signed)
 Echo shows normal LV function with mild LVH and no regional wall motion abnormalities. No further recommendations at this time. Cardiology will sign off. Please do not hesitate to reach back out with further questions.   Thank you, Emeline Calender, DO

## 2024-11-13 NOTE — Progress Notes (Incomplete)
 HD#2 SUBJECTIVE:  Patient Summary: Darrell [name] is a 63 y.o. with a pertinent PMH of ***, who presented with *** and admitted for ***.   Overnight Events: NAEON  Interim History: ***  OBJECTIVE:  Vital Signs: Vitals:   11/12/24 1556 11/12/24 1839 11/12/24 2328 11/13/24 0600  BP: (!) 135/107 (!) 130/95 (!) 137/93 (!) 138/99  Pulse: 84 87 85 81  Resp: 18 18 16 16   Temp: 98.6 F (37 C) 98.1 F (36.7 C) 98 F (36.7 C) 97.8 F (36.6 C)  TempSrc: Oral Oral Oral Oral  SpO2: 97% 97% 96% 97%   Supplemental O2: Room Air SpO2: 97 %  There were no vitals filed for this visit.  No intake or output data in the 24 hours ending 11/13/24 0700 Net IO Since Admission: 1,000 mL [11/13/24 0700]  Physical Exam: Physical Exam  Patient Lines/Drains/Airways Status     Active Line/Drains/Airways     Name Placement date Placement time Site Days   Peripheral IV 11/11/24 20 G 1.88 Posterior;Right Forearm 11/11/24  1525  Forearm  2            Pertinent labs and imaging:      Latest Ref Rng & Units 11/12/2024    4:12 PM 11/10/2024   11:10 PM 08/27/2024   11:17 AM  CBC  WBC 4.0 - 10.5 K/uL 6.7  7.6  8.5   Hemoglobin 13.0 - 17.0 g/dL 81.5  79.3  83.4   Hematocrit 39.0 - 52.0 % 54.5  59.3  49.3   Platelets 150 - 400 K/uL 158  242  249        Latest Ref Rng & Units 11/12/2024    4:12 PM 11/11/2024    1:27 AM 11/10/2024   11:10 PM  CMP  Glucose 70 - 99 mg/dL 88   883   BUN 8 - 23 mg/dL 11   <5   Creatinine 9.38 - 1.24 mg/dL 8.79   9.08   Sodium 864 - 145 mmol/L 135   133   Potassium 3.5 - 5.1 mmol/L 4.5   3.7   Chloride 98 - 111 mmol/L 96   95   CO2 22 - 32 mmol/L 23   22   Calcium  8.9 - 10.3 mg/dL 8.9   8.7   Total Protein 6.5 - 8.1 g/dL  6.2    Total Bilirubin 0.0 - 1.2 mg/dL  1.6    Alkaline Phos 38 - 126 U/L  75    AST 15 - 41 U/L  38    ALT 0 - 44 U/L  28      ECHOCARDIOGRAM COMPLETE Result Date: 11/12/2024    ECHOCARDIOGRAM REPORT   Patient Name:    Darrell [name] Date of Exam: 11/12/2024 Medical Rec #:  [id number]         Height:       71.0 in Accession #:    [id number]        Weight:       205.0 lb Date of Birth:  03-13-1961          BSA:          2.131 m Patient Age:    63 years          BP:           165/74 mmHg Patient Gender: M                 HR:  71 bpm. Exam Location:  Inpatient Procedure: 2D Echo, Cardiac Doppler, Color Doppler and Strain Analysis (Both            Spectral and Color Flow Doppler were utilized during procedure). Indications:    Chest Pain  History:        Patient has no prior history of Echocardiogram examinations.                 CAD; Risk Factors:Hypertension. CKD.  Sonographer:    Philomena Daring Referring Phys: [phone] LINDSAY B ROBERTS  Sonographer Comments: Global longitudinal strain was attempted. IMPRESSIONS  1. Left ventricular ejection fraction, by estimation, is 60 to 65%. The left ventricle has normal function. The left ventricle has no regional wall motion abnormalities. There is mild concentric left ventricular hypertrophy. Left ventricular diastolic parameters are indeterminate.  2. Right ventricular systolic function is normal. The right ventricular size is normal.  3. The mitral valve is normal in structure. Trivial mitral valve regurgitation.  4. The aortic valve is tricuspid. Aortic valve regurgitation is not visualized.  5. The inferior vena cava is normal in size with greater than 50% respiratory variability, suggesting right atrial pressure of 3 mmHg. FINDINGS  Left Ventricle: Left ventricular ejection fraction, by estimation, is 60 to 65%. The left ventricle has normal function. The left ventricle has no regional wall motion abnormalities. The left ventricular internal cavity size was normal in size. There is  mild concentric left ventricular hypertrophy. Left ventricular diastolic parameters are indeterminate. Right Ventricle: The right ventricular size is normal. Right vetricular wall thickness was not  assessed. Right ventricular systolic function is normal. Left Atrium: Left atrial size was normal in size. Right Atrium: Right atrial size was normal in size. Pericardium: There is no evidence of pericardial effusion. Mitral Valve: The mitral valve is normal in structure. Trivial mitral valve regurgitation. Tricuspid Valve: The tricuspid valve is normal in structure. Tricuspid valve regurgitation is trivial. Aortic Valve: The aortic valve is tricuspid. Aortic valve regurgitation is not visualized. Aortic valve mean gradient measures 3.0 mmHg. Aortic valve peak gradient measures 5.2 mmHg. Aortic valve area, by VTI measures 3.26 cm. Pulmonic Valve: The pulmonic valve was normal in structure. Pulmonic valve regurgitation is not visualized. Aorta: The aortic root and ascending aorta are structurally normal, with no evidence of dilitation. Venous: The inferior vena cava is normal in size with greater than 50% respiratory variability, suggesting right atrial pressure of 3 mmHg. IAS/Shunts: No atrial level shunt detected by color flow Doppler.  LEFT VENTRICLE PLAX 2D LVIDd:         4.40 cm   Diastology LVIDs:         3.10 cm   LV e' medial:    6.53 cm/s LV PW:         1.30 cm   LV E/e' medial:  9.1 LV IVS:        1.30 cm   LV e' lateral:   7.62 cm/s LVOT diam:     2.22 cm   LV E/e' lateral: 7.8 LV SV:         69 LV SV Index:   32 LVOT Area:     3.87 cm LV IVRT:       70 msec  RIGHT VENTRICLE             IVC RV Basal diam:  3.04 cm     IVC diam: 1.30 cm RV Mid diam:    2.64 cm RV S prime:  16.50 cm/s TAPSE (M-mode): 2.0 cm LEFT ATRIUM             Index        RIGHT ATRIUM           Index LA diam:        3.77 cm 1.77 cm/m   RA Area:     11.30 cm LA Vol (A2C):   35.0 ml 16.43 ml/m  RA Volume:   27.20 ml  12.77 ml/m LA Vol (A4C):   54.6 ml 25.62 ml/m LA Biplane Vol: 46.1 ml 21.64 ml/m  AORTIC VALVE AV Area (Vmax):    3.26 cm AV Area (Vmean):   3.11 cm AV Area (VTI):     3.26 cm AV Vmax:           114.00 cm/s AV  Vmean:          77.000 cm/s AV VTI:            0.210 m AV Peak Grad:      5.2 mmHg AV Mean Grad:      3.0 mmHg LVOT Vmax:         95.90 cm/s LVOT Vmean:        61.800 cm/s LVOT VTI:          0.177 m LVOT/AV VTI ratio: 0.84  AORTA Ao Root diam: 3.19 cm Ao Asc diam:  3.67 cm MITRAL VALVE MV Area (PHT): 4.33 cm    SHUNTS MV Decel Time: 175 msec    Systemic VTI:  0.18 m MV E velocity: 59.20 cm/s  Systemic Diam: 2.22 cm MV A velocity: 81.80 cm/s MV E/A ratio:  0.72 Vina Gull MD Electronically signed by Vina Gull MD Signature Date/Time: 11/12/2024/4:51:18 PM    Final     ASSESSMENT/PLAN:  Assessment: Principal Problem:   Chest pain with high risk for cardiac etiology Active Problems:   Tobacco use disorder   Substance abuse (HCC)   Hypertension   Depression   Alcohol use disorder, severe, dependence (HCC)   Major depressive disorder, recurrent episode, moderate (HCC)   Homelessness   Alcohol use disorder   Plan: Darrell [name] is a 63 y.o. person living with a history of STEMI in 2014 s/p PCI, alcohol abuse, methamphetamine, and cocaine abuse who presented with chest pain and admitted for ischemia eval on hospital day 0   #Chest Pain  #Hx of STEMI in 2014 Patient initially presented with acute chest pain, which at the time he described as dull and achy and radiating slightly to his back.  He does have a history of a STEMI back in 2014 which did require intervention.  His troponins were mildly elevated at 46, and at 53.  EKG is also reassuring, and patient is not actively having any chest pain after he was given aspirin  325 mg by EMS.  Differential diagnosis: Demand ischemia/possible pancreatitis/esophagitis/musculoskeletal pain   Cardiology consulted:Elevated troponin, likely demand ischemia in the setting of polysubstance abuse  If no significant abnormalities, cardiology will sign off. Rest of plan as per APP note.Echo: EF: 60-65,no regional wall motion abnormalities.  Plan:  -  Restarting his aspirin  and statin - Echo was done  - Check lipase if persistent abdominal pain radiating to back - pantoprazole  40 daily  - Lido patch and diclofenac gel if MSK   #Alcohol Use Disorder #Methamphetamine Use Disorder  Per chart review, it appears he has been drinking a pint of liquor a day, and per EMS run sheet he  had 6 24 ounce beers on the night of November 16.  Last methamphetamine use was also on November 10, 2024.  He does not appear to be acutely intoxicated at this time.  Librium  taper has worked well for him in the past, will start this with as well as as needed Ativan  for withdrawal symptoms.  No history of seizures, or delirium tremens.  Could not check CIWA score last night, patient denied telemetry.Morning CIWA: 7 and went up to 9. Plan:  - Librium  taper - Thiamine   - Naltrexone 50 daily - Ativan  injection as needed per CIWA scale -TOC consult placed - He would likely benefit from inpatient detox programs - Consider starting naltrexone   #Suicidal Ideations  Mention this to the emergency department provider, he states that he was having passive suicidal ideations with no plan, he is not having suicidal ideations at this time.  Psychiatry consulted, will follow-up their recommendations.  Once medically stable, I think he would benefit from inpatient behavioral health management.  -Hydroxyzine    # Tobacco Use Disorder 40-pack-year smoking history, will give nicotine  patch while inpatient.   #Alcohol Related Neuropathy  Takes Gabapentin  800mg  TID at home, will hold for now given Librium  and Ativan  and at risk for oversedation with alcohol still in his system.    #Hypertension Restarting his home lisinopril  20mg  and amlodipine  5mg    #Erythrocytosis  Hemoglobin currently at 20.6, it appears his baseline is around 17.  Could be in the setting of his chronic tobacco use.  Kidney function appears to be within normal limits.   #Hx of Hep C Last testing that I can  see is in 2020 with Hep C antibody positive. Checking hepatic function panel.    #Hx of Colon Cancer  Per documentation underwent surgery at [facility] in May of 2024, however unable to find documentation of it.    Best Practice: Diet: mechanical soft  IVF: N/A VTE: enoxaparin  (LOVENOX ) injection 40 mg Start: 11/11/24 0845 Code: Full  Best Practice: Diet: Regular diet IVF: N/A VTE: enoxaparin  (LOVENOX ) injection 40 mg Start: 11/11/24 0845 Code: Full  Disposition planning: Therapy Recs: Rehabilitation, DME: none Family Contact: , unable to be notified. DISPO: Anticipated discharge tomorrow to  pending CIWA.  Signature:  Rukaya Kleinschmidt [facility]  7:00 AM, 11/13/2024  On Call pager [phone]

## 2024-11-21 NOTE — Discharge Summary (Signed)
 Name: Darrell [name] MRN: [id number] DOB: Nov 10, 1961 63 y.o. PCP: Patient, No Pcp Per  Date of Admission: 11/10/2024 10:55 PM Date of Discharge: 11/05/2024 Attending Physician: Dr. Jone Dauphin  Discharge Diagnosis: 1. Principal Problem:   Chest pain with high risk for cardiac etiology Active Problems:   Tobacco use disorder   Substance abuse (HCC)   Hypertension   Depression   Alcohol use disorder, severe, dependence (HCC)   Major depressive disorder, recurrent episode, moderate (HCC)   Homelessness   Alcohol use disorder    Discharge Medications: Allergies as of 11/13/2024       Reactions   Phenobarbital Anaphylaxis   Chlordiazepoxide  Hcl Nausea And Vomiting, Rash, Other (See Comments)   Chills         Medication List     ASK your doctor about these medications    amLODipine  5 MG tablet Commonly known as: NORVASC  Take 5 mg by mouth daily.   aspirin  EC 81 MG tablet Take 81 mg by mouth daily. Swallow whole.   atorvastatin  40 MG tablet Commonly known as: LIPITOR  Take 40 mg by mouth daily.   gabapentin  800 MG tablet Commonly known as: Neurontin  Take 1 tablet (800 mg total) by mouth 3 (three) times daily.   lisinopril  20 MG tablet Commonly known as: ZESTRIL  Take 20 mg by mouth daily.   multivitamin with minerals Tabs tablet Take 1 tablet by mouth daily.   nicotine  21 mg/24hr patch Commonly known as: NICODERM CQ  - dosed in mg/24 hours Place 1 patch (21 mg total) onto the skin daily.        Disposition and follow-up:   Mr. SUMIT BRANHAM was discharged from [facility] in Fair condition. At the hospital follow-up visit please address:  Alcohol withdrawal and polysubstance use: - Assess for ongoing withdrawal symptoms, mood changes, and safety. - Review need for continuation or adjustment of Librium  taper. - Discuss substance-use treatment options, including inpatient or outpatient detox programs. - Reassess suicidal  ideation and mental health needs.  Labs / imaging needed at time of follow-up:CMP, CBC  Pending labs / test needing follow-up: Review hepatitis C serologies and liver function tests.   Hospital Course by problem list:  Darrell [name] is a 63 year old person living with a history of alcohol use disorder, polysubstance use (methamphetamine and cocaine), hypertension, depression, and prior STEMI in 2014 s/p PCI who presented with chest pain and was admitted for evaluation of possible cardiac ischemia and management of alcohol withdrawal. He is now being discharged on hospital day 2 with the following pertinent hospital course:  # Chest Pain / History of STEMI (2014) The patient presented with dull chest pain that improved after receiving aspirin  from EMS. EKG showed no acute ischemic changes. Troponins were mildly elevated (46 ? 53), consistent with demand ischemia in the setting of alcohol and polysubstance use. Chest X-ray showed no acute process. Echocardiogram revealed normal LV systolic function, mild LVH, and no regional wall motion abnormalities. Cardiology evaluated the patient and, with a reassuring echo and stable symptoms, recommended no further cardiac intervention and signed off.  # Alcohol Use Disorder / Polysubstance Use (Methamphetamine, Cocaine) The patient reported heavy alcohol intake (1 pint liquor daily + 6 beers on 11/16) and methamphetamine use on 11/16. He developed early alcohol withdrawal with tremors and anxiety. CIWA scores increased from 7 to 9. He was started on a Librium  taper, thiamine , nicotine  patch, and PRN Ativan . He remained at risk for worsening withdrawal and  was not yet medically ready for discharge, requiring continued inpatient monitoring. A TOC consult was placed. He would benefit from inpatient detox resources.  # Suicidal Ideation (Passive) In the ED, he endorsed passive suicidal thoughts without a plan. On the floor, he denied active SI. Psychiatry  was consulted and recommended supportive management. He was started on hydroxyzine  for anxiety. Plan was for possible transfer to inpatient behavioral health when medically stable.  # Hypertension Home antihypertensives (lisinopril  20 mg, amlodipine  5 mg) were restarted with adequate BP control during hospitalization.  # Tobacco Use Disorder He has a 40-pack-year history. Nicotine  patch was initiated during admission.  # Alcohol-Related Neuropathy Home gabapentin  800 mg TID was held due to high sedation risk when combined with Librium  and Ativan  for withdrawal management.  # Erythrocytosis Hemoglobin was elevated at 20.6, consistent with chronic tobacco use. Renal function remained normal. No acute intervention was required.  # History of Hepatitis C Hepatic function panel was checked due to known past Hep C antibody positivity.  # History of Colon Cancer Per chart history, he underwent colon surgery in 2024, though documentation was limited. No acute issues related to this during hospitalization.  Disposition Despite ongoing alcohol withdrawal treatment, psychiatric evaluation, and medical recommendations to remain hospitalized for safety, the patient left the hospital against medical advice (AMA) on 11/12/2024.   Subjective Patient was sitting in bed without acute distress. He denied chest pain, shortness of breath, or any new concerns aside from possible withdrawal symptoms. He expressed frustration about not having a bed assignment, which contributed to his restlessness.   Discharge Exam:   BP (!) 138/99 (BP Location: Right Arm)   Pulse 81   Temp 97.8 F (36.6 C) (Oral)   Resp 16   SpO2 97%  Discharge exam:  Constitutional: Disheveled appearing male, laying in bed, in no acute distress but anxious Abdomen: soft, non distended without tenderness HENT: normocephalic atraumatic, mucous membranes dry Eye: Erythematous conjunctivae  Cardiovascular: regular rate and rhythm, no  m/r/g Pulmonary/Chest: normal work of breathing on room air, lungs clear to auscultation bilaterally Neuro: Anxious and upper ext tremor  Pertinent Labs, Studies, and Procedures:     Latest Ref Rng & Units 11/12/2024    4:12 PM 11/10/2024   11:10 PM 08/27/2024   11:17 AM  CBC  WBC 4.0 - 10.5 K/uL 6.7  7.6  8.5   Hemoglobin 13.0 - 17.0 g/dL 81.5  79.3  83.4   Hematocrit 39.0 - 52.0 % 54.5  59.3  49.3   Platelets 150 - 400 K/uL 158  242  249        Latest Ref Rng & Units 11/12/2024    4:12 PM 11/11/2024    1:27 AM 11/10/2024   11:10 PM  CMP  Glucose 70 - 99 mg/dL 88   883   BUN 8 - 23 mg/dL 11   <5   Creatinine 9.38 - 1.24 mg/dL 8.79   9.08   Sodium 864 - 145 mmol/L 135   133   Potassium 3.5 - 5.1 mmol/L 4.5   3.7   Chloride 98 - 111 mmol/L 96   95   CO2 22 - 32 mmol/L 23   22   Calcium  8.9 - 10.3 mg/dL 8.9   8.7   Total Protein 6.5 - 8.1 g/dL  6.2    Total Bilirubin 0.0 - 1.2 mg/dL  1.6    Alkaline Phos 38 - 126 U/L  75    AST 15 - 41  U/L  38    ALT 0 - 44 U/L  28      DG Chest 2 View Result Date: 11/10/2024 EXAM: 2 VIEW(S) XRAY OF THE CHEST 11/10/2024 11:25:00 PM COMPARISON: None available. CLINICAL HISTORY: chest pain chest pain FINDINGS: LUNGS AND PLEURA: No focal pulmonary opacity. No pleural effusion. No pneumothorax. HEART AND MEDIASTINUM: No acute abnormality of the cardiac and mediastinal silhouettes. BONES AND SOFT TISSUES: No acute osseous abnormality. IMPRESSION: 1. No acute process. Electronically signed by: Dorethia Molt MD 11/10/2024 11:33 PM EST RP Workstation: [id number]     Discharge Instructions:  Medications Continue taking your usual home medicines exactly as prescribed. Do NOT take gabapentin  unless restarted by your primary care provider (it may worsen sedation when combined with alcohol or withdrawal medications). Continue: Aspirin  Statin Lisinopril  Amlodipine  Any other home medications Librium  taper for alcohol withdrawal, continue only as  directed. Avoid mixing alcohol or drugs with ANY medication especially Librium  , this can be life-threatening.  Activity Rest and avoid strenuous activity for the next 24-48 hours. Do not drive or operate machinery if you feel shaky, anxious, dizzy, or sedated.  Alcohol Withdrawal & Safety You were still in early withdrawal when you left. Watch for: Worsening tremors Severe anxiety Fast heart rate Sweating Confusion Hallucinations If any of these occur, return to the ER immediately. Heart Symptoms Seek emergency care right away if you notice: Chest pain Trouble breathing Fainting New or worsening dizziness  Mental Health You told the emergency provider that you had passive suicidal thoughts. If you feel unsafe or have thoughts of harming yourself, call [phone], go to the nearest emergency room, or reach out to someone you trust.  Follow-Up Care Please call your primary care provider within 1-2 days to schedule an appointment.   When to Return to the ER Come back immediately if you experience: Chest pain or pressure Shortness of breath Severe shaking, confusion, or hallucinations Vomiting and inability to keep fluids down Thoughts of self-harm Any symptom that feels worse or unsafe  Signed: Bernadine Manos, MD 11/21/2024, 7:57 AM

## 2025-01-17 ENCOUNTER — Telehealth (HOSPITAL_COMMUNITY): Payer: Self-pay

## 2025-01-17 ENCOUNTER — Encounter (HOSPITAL_COMMUNITY): Payer: Self-pay

## 2025-01-17 NOTE — Telephone Encounter (Signed)
 Outside/paper referral received by Onbase from [facility]. Will fax over Physician order and request further documents. Insurance benefits and eligibility to be determined.

## 2025-01-17 NOTE — Telephone Encounter (Signed)
 Called patient to see if he was interested in [facility]. No answer and could not leave message.  Mailed letter.

## 2025-01-22 ENCOUNTER — Telehealth (HOSPITAL_COMMUNITY): Payer: Self-pay

## 2025-01-22 NOTE — Telephone Encounter (Signed)
 12 Lead EKG received and scanned to Media
